# Patient Record
Sex: Male | Born: 1955 | Race: Black or African American | Hispanic: No | Marital: Married | Smoking: Former smoker
Health system: Southern US, Community
[De-identification: ages and names within clinical notes are randomized; demographics above are authoritative.]

## PROBLEM LIST (undated history)

## (undated) DIAGNOSIS — T884XXA Failed or difficult intubation, initial encounter: Secondary | ICD-10-CM

## (undated) DIAGNOSIS — N529 Male erectile dysfunction, unspecified: Secondary | ICD-10-CM

## (undated) DIAGNOSIS — Z720 Tobacco use: Secondary | ICD-10-CM

## (undated) DIAGNOSIS — I10 Essential (primary) hypertension: Secondary | ICD-10-CM

## (undated) DIAGNOSIS — C109 Malignant neoplasm of oropharynx, unspecified: Secondary | ICD-10-CM

## (undated) DIAGNOSIS — L089 Local infection of the skin and subcutaneous tissue, unspecified: Secondary | ICD-10-CM

## (undated) DIAGNOSIS — C801 Malignant (primary) neoplasm, unspecified: Secondary | ICD-10-CM

## (undated) DIAGNOSIS — E785 Hyperlipidemia, unspecified: Secondary | ICD-10-CM

## (undated) HISTORY — DX: Male erectile dysfunction, unspecified: N52.9

## (undated) HISTORY — DX: Malignant neoplasm of oropharynx, unspecified: C10.9

## (undated) HISTORY — DX: Hyperlipidemia, unspecified: E78.5

## (undated) HISTORY — DX: Essential (primary) hypertension: I10

## (undated) HISTORY — DX: Malignant (primary) neoplasm, unspecified: C80.1

## (undated) HISTORY — DX: Tobacco use: Z72.0

## (undated) HISTORY — DX: Local infection of the skin and subcutaneous tissue, unspecified: L08.9

---

## 2003-07-09 HISTORY — PX: HERNIA REPAIR: SHX51

## 2003-12-10 ENCOUNTER — Emergency Department (HOSPITAL_COMMUNITY): Admission: EM | Admit: 2003-12-10 | Discharge: 2003-12-10 | Payer: Self-pay | Admitting: *Deleted

## 2005-01-09 ENCOUNTER — Emergency Department (HOSPITAL_COMMUNITY): Admission: EM | Admit: 2005-01-09 | Discharge: 2005-01-09 | Payer: Self-pay | Admitting: Family Medicine

## 2005-02-13 ENCOUNTER — Ambulatory Visit (HOSPITAL_COMMUNITY): Admission: RE | Admit: 2005-02-13 | Discharge: 2005-02-13 | Payer: Self-pay | Admitting: Surgery

## 2005-02-13 ENCOUNTER — Ambulatory Visit (HOSPITAL_BASED_OUTPATIENT_CLINIC_OR_DEPARTMENT_OTHER): Admission: RE | Admit: 2005-02-13 | Discharge: 2005-02-13 | Payer: Self-pay | Admitting: Surgery

## 2005-11-18 ENCOUNTER — Ambulatory Visit: Payer: Self-pay | Admitting: Internal Medicine

## 2005-11-20 ENCOUNTER — Ambulatory Visit: Payer: Self-pay | Admitting: Internal Medicine

## 2005-11-26 ENCOUNTER — Ambulatory Visit: Payer: Self-pay | Admitting: Internal Medicine

## 2006-01-16 ENCOUNTER — Ambulatory Visit: Payer: Self-pay | Admitting: Gastroenterology

## 2006-01-24 ENCOUNTER — Encounter (INDEPENDENT_AMBULATORY_CARE_PROVIDER_SITE_OTHER): Payer: Self-pay | Admitting: *Deleted

## 2006-01-24 ENCOUNTER — Ambulatory Visit: Payer: Self-pay | Admitting: Gastroenterology

## 2006-03-05 ENCOUNTER — Ambulatory Visit: Payer: Self-pay | Admitting: Hospitalist

## 2006-03-12 ENCOUNTER — Ambulatory Visit: Payer: Self-pay | Admitting: Internal Medicine

## 2006-05-20 DIAGNOSIS — E785 Hyperlipidemia, unspecified: Secondary | ICD-10-CM | POA: Insufficient documentation

## 2006-07-24 DIAGNOSIS — F528 Other sexual dysfunction not due to a substance or known physiological condition: Secondary | ICD-10-CM

## 2006-09-11 ENCOUNTER — Telehealth (INDEPENDENT_AMBULATORY_CARE_PROVIDER_SITE_OTHER): Payer: Self-pay | Admitting: *Deleted

## 2006-09-15 ENCOUNTER — Telehealth: Payer: Self-pay | Admitting: *Deleted

## 2006-10-09 ENCOUNTER — Ambulatory Visit: Payer: Self-pay | Admitting: Internal Medicine

## 2006-10-09 ENCOUNTER — Encounter (INDEPENDENT_AMBULATORY_CARE_PROVIDER_SITE_OTHER): Payer: Self-pay | Admitting: *Deleted

## 2006-10-16 ENCOUNTER — Encounter (INDEPENDENT_AMBULATORY_CARE_PROVIDER_SITE_OTHER): Payer: Self-pay | Admitting: *Deleted

## 2006-10-16 ENCOUNTER — Ambulatory Visit: Payer: Self-pay | Admitting: Internal Medicine

## 2006-10-16 LAB — CONVERTED CEMR LAB
Albumin: 4.3 g/dL (ref 3.5–5.2)
CO2: 22 meq/L (ref 19–32)
Cholesterol: 166 mg/dL (ref 0–200)
Glucose, Bld: 82 mg/dL (ref 70–99)
Sodium: 142 meq/L (ref 135–145)
Total Bilirubin: 0.4 mg/dL (ref 0.3–1.2)
Total Protein: 7.2 g/dL (ref 6.0–8.3)
Triglycerides: 64 mg/dL (ref <150)
VLDL: 13 mg/dL (ref 0–40)

## 2007-07-15 ENCOUNTER — Ambulatory Visit: Payer: Self-pay | Admitting: Internal Medicine

## 2007-07-21 ENCOUNTER — Encounter (INDEPENDENT_AMBULATORY_CARE_PROVIDER_SITE_OTHER): Payer: Self-pay | Admitting: *Deleted

## 2007-07-21 ENCOUNTER — Ambulatory Visit: Payer: Self-pay | Admitting: Internal Medicine

## 2007-07-22 LAB — CONVERTED CEMR LAB
BUN: 17 mg/dL (ref 6–23)
CO2: 25 meq/L (ref 19–32)
Calcium: 9.4 mg/dL (ref 8.4–10.5)
Chloride: 106 meq/L (ref 96–112)
Cholesterol: 174 mg/dL (ref 0–200)
Creatinine, Ser: 0.92 mg/dL (ref 0.40–1.50)
Glucose, Bld: 95 mg/dL (ref 70–99)
HDL: 43 mg/dL (ref >39)
Total CHOL/HDL Ratio: 4
Triglycerides: 97 mg/dL (ref <150)

## 2007-07-23 ENCOUNTER — Ambulatory Visit: Payer: Self-pay | Admitting: Internal Medicine

## 2007-07-24 ENCOUNTER — Encounter: Payer: Self-pay | Admitting: Internal Medicine

## 2007-07-24 ENCOUNTER — Ambulatory Visit: Payer: Self-pay | Admitting: Surgery

## 2007-07-24 ENCOUNTER — Telehealth: Payer: Self-pay | Admitting: *Deleted

## 2007-07-24 ENCOUNTER — Ambulatory Visit (HOSPITAL_COMMUNITY): Admission: RE | Admit: 2007-07-24 | Discharge: 2007-07-24 | Payer: Self-pay | Admitting: Internal Medicine

## 2007-07-30 ENCOUNTER — Ambulatory Visit: Payer: Self-pay | Admitting: Internal Medicine

## 2007-08-18 ENCOUNTER — Encounter (INDEPENDENT_AMBULATORY_CARE_PROVIDER_SITE_OTHER): Payer: Self-pay | Admitting: *Deleted

## 2007-08-18 ENCOUNTER — Ambulatory Visit: Payer: Self-pay | Admitting: Internal Medicine

## 2007-09-15 ENCOUNTER — Ambulatory Visit: Payer: Self-pay | Admitting: Hospitalist

## 2007-09-16 ENCOUNTER — Ambulatory Visit (HOSPITAL_COMMUNITY): Admission: RE | Admit: 2007-09-16 | Discharge: 2007-09-16 | Payer: Self-pay | Admitting: Internal Medicine

## 2007-09-21 ENCOUNTER — Telehealth (INDEPENDENT_AMBULATORY_CARE_PROVIDER_SITE_OTHER): Payer: Self-pay | Admitting: *Deleted

## 2007-09-22 ENCOUNTER — Ambulatory Visit: Payer: Self-pay | Admitting: Hospitalist

## 2007-09-22 DIAGNOSIS — D179 Benign lipomatous neoplasm, unspecified: Secondary | ICD-10-CM | POA: Insufficient documentation

## 2008-10-03 ENCOUNTER — Telehealth: Payer: Self-pay | Admitting: *Deleted

## 2008-10-20 ENCOUNTER — Ambulatory Visit: Payer: Self-pay

## 2008-10-20 ENCOUNTER — Encounter: Payer: Self-pay | Admitting: Internal Medicine

## 2008-10-20 ENCOUNTER — Encounter (INDEPENDENT_AMBULATORY_CARE_PROVIDER_SITE_OTHER): Payer: Self-pay | Admitting: *Deleted

## 2008-10-20 DIAGNOSIS — I10 Essential (primary) hypertension: Secondary | ICD-10-CM

## 2008-10-21 LAB — CONVERTED CEMR LAB
LDL Cholesterol: 141 mg/dL — ABNORMAL HIGH (ref 0–99)
VLDL: 20 mg/dL (ref 0–40)

## 2008-11-09 ENCOUNTER — Ambulatory Visit: Payer: Self-pay | Admitting: Infectious Disease

## 2008-11-09 ENCOUNTER — Encounter: Payer: Self-pay | Admitting: Internal Medicine

## 2008-11-09 LAB — CONVERTED CEMR LAB
ALT: 17 units/L (ref 0–53)
AST: 22 units/L (ref 0–37)
BUN: 19 mg/dL (ref 6–23)
Creatinine, Ser: 0.97 mg/dL (ref 0.40–1.50)
GFR calc Af Amer: >60 mL/min (ref >60)
HDL goal, serum: 40 mg/dL
LDL Goal: 130 mg/dL
Total Bilirubin: 0.3 mg/dL (ref 0.3–1.2)

## 2009-03-30 ENCOUNTER — Ambulatory Visit: Payer: Self-pay | Admitting: Internal Medicine

## 2009-03-30 LAB — CONVERTED CEMR LAB
AST: 24 units/L (ref 0–37)
Albumin: 4.3 g/dL (ref 3.5–5.2)
BUN: 21 mg/dL (ref 6–23)
Calcium: 9.4 mg/dL (ref 8.4–10.5)
Chloride: 103 meq/L (ref 96–112)
Glucose, Bld: 99 mg/dL (ref 70–99)
HDL: 46 mg/dL (ref >39)
LDL Cholesterol: 110 mg/dL — ABNORMAL HIGH (ref 0–99)
Potassium: 4.2 meq/L (ref 3.5–5.3)
Sodium: 140 meq/L (ref 135–145)
Total Protein: 7.3 g/dL (ref 6.0–8.3)

## 2009-12-22 ENCOUNTER — Ambulatory Visit: Payer: Self-pay | Admitting: Internal Medicine

## 2009-12-22 DIAGNOSIS — F17200 Nicotine dependence, unspecified, uncomplicated: Secondary | ICD-10-CM

## 2010-01-09 ENCOUNTER — Emergency Department (HOSPITAL_COMMUNITY): Admission: EM | Admit: 2010-01-09 | Discharge: 2010-01-09 | Payer: Self-pay | Admitting: Family Medicine

## 2010-02-02 ENCOUNTER — Ambulatory Visit: Payer: Self-pay | Admitting: Internal Medicine

## 2010-02-02 DIAGNOSIS — L089 Local infection of the skin and subcutaneous tissue, unspecified: Secondary | ICD-10-CM | POA: Insufficient documentation

## 2010-08-09 NOTE — Assessment & Plan Note (Signed)
Summary: EST-CK/FU/MEDS/CFB   Vital Signs:  Patient profile:   55 year old male Height:      74 inches (187.96 cm) Weight:      236.03 pounds (107.29 kg) BMI:     30.41 Temp:     97 degrees F (36.11 degrees C) oral Pulse rate:   84 / minute BP sitting:   135 / 86  (right arm)  Vitals Entered By: Angelina Ok RN (December 22, 2009 3:09 PM) Is Patient Diabetic? No Pain Assessment Patient in pain? no      Nutritional Status BMI of > 30 = obese  Have you ever been in a relationship where you felt threatened, hurt or afraid?No   Does patient need assistance? Functional Status Self care Ambulation Normal Comments Needs refill on meds.  Headaches on yesterday went away after takking Aleve.   Primary Care Provider:  Jackson Latino MD   History of Present Illness: Pt is a 55 yo AAM with PMH of HTN, tobacco abuse and HLD came here for med refill. He has been been doing well, no CP, HA, SOB, fever. He has run out of HCTZ for 2 weeks,  good appetite, no diarrhea or dysuria.  He is a current smoker, 1/2 PPD, occasional drink, no drugs. Denies any muscle pain.   Depression History:      The patient denies a depressed mood most of the day and a diminished interest in his usual daily activities.         Preventive Screening-Counseling & Management  Alcohol-Tobacco     Alcohol type: on special occasion     Smoking Status: current     Smoking Cessation Counseling: yes     Packs/Day: 0.5     Year Started: ABOUT 3 YEARS AGO  Comments: Increase  Problems Prior to Update: 1)  Preventive Health Care  (ICD-V70.0) 2)  Lipoma  (ICD-214.9) 3)  Essential Hypertension, Benign  (ICD-401.1) 4)  Aftercare, Long-term Use, Medications Nec  (ICD-V58.69) 5)  Erectile Dysfunction  (ICD-302.72) 6)  Hyperlipidemia  (ICD-272.4)  Medications Prior to Update: 1)  Pravastatin Sodium 40 Mg  Tabs (Pravastatin Sodium) .... Take 2 Tablets By Mouth At Bedtime 2)  Viagra 50 Mg  Tabs (Sildenafil Citrate)  .... Take 1 Tablet By Mouth When Needed.  Do Not Exceed 1 Dose Daily. 3)  Hydrochlorothiazide 25 Mg Tabs (Hydrochlorothiazide) .... Take 1 Tablet By Mouth Once A Day  Current Medications (verified): 1)  Pravastatin Sodium 40 Mg  Tabs (Pravastatin Sodium) .... Take 2 Tablets By Mouth At Bedtime 2)  Viagra 50 Mg  Tabs (Sildenafil Citrate) .... Take 1 Tablet By Mouth When Needed.  Do Not Exceed 1 Dose Daily. 3)  Hydrochlorothiazide 25 Mg Tabs (Hydrochlorothiazide) .... Take 1 Tablet By Mouth Once A Day  Allergies (verified): No Known Drug Allergies  Past History:  Past Medical History: Last updated: 09/22/2007 - L inner thigh nodule (largest measured at 5.5x3.5cm) (1/09) = LIPOMA - Elevated BP without diagnosis of HTN (1/09) - Hyperlipidemia - Erectile dysfunction  Past Surgical History: Last updated: 10/09/2006 Right Hernia repair 2005  Family History: Last updated: 07/15/2007 Mom: living as of 1/09 Dad: living as of 1/09 Siblings: has 5 sisters (some with anxiety/depression) Children: 1 son (48; healthy) and 1 daughter (43; healthy)  Pt knows of no family h/o HTN, heart problems, CVAs.  Social History: Last updated: 07/15/2007 Occupation:[employer] engineer, contracting; a Forensic scientist) Current Smoker:  1/2 pk/2 day (quit for 5y, now been smoking again  for 3y) x 30y Alcohol use - yes on holidays Drug use - no  Risk Factors: Caffeine Use: 3 (03/30/2009) Exercise: yes (03/30/2009)  Risk Factors: Smoking Status: current (12/22/2009) Packs/Day: 0.5 (12/22/2009)  Family History: Reviewed history from 07/15/2007 and no changes required. Mom: living as of 1/09 Dad: living as of 1/09 Siblings: has 5 sisters (some with anxiety/depression) Children: 1 son (19; healthy) and 1 daughter (64; healthy)  Pt knows of no family h/o HTN, heart problems, CVAs.  Social History: Reviewed history from 07/15/2007 and no changes required. Occupation:[employer] engineer, contracting; a  Forensic scientist) Current Smoker:  1/2 pk/2 day (quit for 5y, now been smoking again for 3y) x 30y Alcohol use - yes on holidays Drug use - noPacks/Day:  0.5  Review of Systems  The patient denies fever, chest pain, syncope, dyspnea on exertion, peripheral edema, prolonged cough, headaches, abdominal pain, melena, and hematuria.    Physical Exam  General:  alert, well-developed, well-nourished, and well-hydrated.   Head:  normocephalic.   Nose:  no nasal discharge.   Mouth:  pharynx pink and moist.   Neck:  supple.   Lungs:  normal respiratory effort, normal breath sounds, no crackles, and no wheezes.   Heart:  normal rate, regular rhythm, no murmur, and no JVD.   Abdomen:  soft, non-tender, normal bowel sounds, and no distention.   Msk:  normal ROM, no joint tenderness, no joint swelling, and no joint warmth.   Pulses:  2+ Extremities:  No edema.  Neurologic:  alert & oriented X3, cranial nerves II-XII intact, strength normal in all extremities, sensation intact to light touch, and DTRs symmetrical and normal.     Impression & Recommendations:  Problem # 1:  ESSENTIAL HYPERTENSION, BENIGN (ICD-401.1) Assessment Unchanged His BP is okay and has run out of HCTZ for about 2 weeks. Will refill this for him. Also advised exercise and smoking cessation. Will check BP and CMET at next visit.   His updated medication list for this problem includes:    Hydrochlorothiazide 25 Mg Tabs (Hydrochlorothiazide) .Marland Kitchen... Take 1 tablet by mouth once a day  BP today: 135/86 Prior BP: 117/84 (03/30/2009)  Prior 10 Yr Risk Heart Disease: 18 % (11/09/2008)  Labs Reviewed: K+: 4.2 (03/30/2009) Creat: : 0.94 (03/30/2009)   Chol: 171 (03/30/2009)   HDL: 46 (03/30/2009)   LDL: 110 (03/30/2009)   TG: 74 (03/30/2009)  Problem # 2:  HYPERLIPIDEMIA (ICD-272.4) Assessment: Unchanged No muscle pain, will recheck FLP and CMET.   His updated medication list for this problem includes:    Pravastatin Sodium 40 Mg  Tabs (Pravastatin sodium) .Marland Kitchen... Take 2 tablets by mouth at bedtime  Labs Reviewed: SGOT: 24 (03/30/2009)   SGPT: 26 (03/30/2009)  Lipid Goals: Chol Goal: 200 (11/09/2008)   HDL Goal: 40 (11/09/2008)   LDL Goal: 130 (11/09/2008)   TG Goal: 150 (11/09/2008)  Prior 10 Yr Risk Heart Disease: 18 % (11/09/2008)   HDL:46 (03/30/2009), 42 (10/20/2008)  LDL:110 (03/30/2009), 141 (10/20/2008)  Chol:171 (03/30/2009), 203 (10/20/2008)  Trig:74 (03/30/2009), 101 (10/20/2008)  Problem # 3:  ERECTILE DYSFUNCTION (ICD-302.72) Assessment: Unchanged  He has ED and responds well to viagra. He wants to get it and will refill it. His updated medication list for this problem includes:    Viagra 50 Mg Tabs (Sildenafil citrate) .Marland Kitchen... Take 1 tablet by mouth when needed.  do not exceed 1 dose daily.  Problem # 4:  TOBACCO ABUSE (ICD-305.1) Assessment: Unchanged  Encouraged smoking cessation and discussed different  methods for smoking cessation. He wants to cut for now.   Complete Medication List: 1)  Pravastatin Sodium 40 Mg Tabs (Pravastatin sodium) .... Take 2 tablets by mouth at bedtime 2)  Viagra 50 Mg Tabs (Sildenafil citrate) .... Take 1 tablet by mouth when needed.  do not exceed 1 dose daily. 3)  Hydrochlorothiazide 25 Mg Tabs (Hydrochlorothiazide) .... Take 1 tablet by mouth once a day  Patient Instructions: 1)  Please schedule a follow-up appointment in 4 months. 2)  Tobacco is very bad for your health and your loved ones! You Should stop smoking!. 3)  Stop Smoking Tips: Choose a Quit date. Cut down before the Quit date. decide what you will do as a substitute when you feel the urge to smoke(gum,toothpick,exercise). 4)  It is important that you exercise regularly at least 20 minutes 5 times a week. If you develop chest pain, have severe difficulty breathing, or feel very tired , stop exercising immediately and seek medical attention. Prescriptions: HYDROCHLOROTHIAZIDE 25 MG TABS  (HYDROCHLOROTHIAZIDE) Take 1 tablet by mouth once a day  #90 x 3   Entered and Authorized by:   Jackson Latino MD   Signed by:   Jackson Latino MD on 12/22/2009   Method used:   Electronically to        [facility] [phone]* (retail)       [address]       [place], [place]  [address]       Ph: [phone]       Fax: [phone]   RxID:   [id number] VIAGRA 50 MG  TABS (SILDENAFIL CITRATE) Take 1 tablet by mouth when needed.  Do not exceed 1 dose daily.  #10 x 4   Entered and Authorized by:   Jackson Latino MD   Signed by:   Jackson Latino MD on 12/22/2009   Method used:   Electronically to        [facility] [phone]* (retail)       [address]       [place], [place]  [address]       Ph: [phone]       Fax: [phone]   RxID:   [id number] PRAVASTATIN SODIUM 40 MG  TABS (PRAVASTATIN SODIUM) Take 2 tablets by mouth at bedtime  #60 x 4   Entered and Authorized by:   Jackson Latino MD   Signed by:   Jackson Latino MD on 12/22/2009   Method used:   Electronically to        [facility] [phone]* (retail)       [address]       [place], [place]  [address]       Ph: [phone]       Fax: [phone]   RxID:   [id number]   Prevention & Chronic Care Immunizations   Influenza vaccine: Not documented   Influenza vaccine deferral: Refused  (03/30/2009)    Tetanus booster: Not documented    Pneumococcal vaccine: Not documented  Colorectal Screening   Hemoccult: Not documented    Colonoscopy: Normal findings  (01/24/2006)   Colonoscopy due: 01/25/2016  Other Screening   PSA: Not documented   Smoking status: current  (12/22/2009)   Smoking cessation counseling: yes  (12/22/2009)  Lipids   Total Cholesterol: 171  (03/30/2009)   Lipid panel action/deferral: Lipid Panel ordered   LDL: 110  (03/30/2009)   LDL Direct: Not documented   HDL: 46  (03/30/2009)  Triglycerides: 74  (03/30/2009)    SGOT (AST): 24  (03/30/2009)    SGPT (ALT): 26  (03/30/2009)   Alkaline phosphatase: 60  (03/30/2009)   Total bilirubin: 0.3  (03/30/2009)    Lipid flowsheet reviewed?: Yes   Progress toward LDL goal: Unchanged  Hypertension   Last Blood Pressure: 135 / 86  (12/22/2009)   Serum creatinine: 0.94  (03/30/2009)   Serum potassium 4.2  (03/30/2009)    Hypertension flowsheet reviewed?: Yes   Progress toward BP goal: At goal  Self-Management Support :   Personal Goals (by the next clinic visit) :      Personal blood pressure goal: 140/90  (03/30/2009)     Personal LDL goal: 130  (03/30/2009)    Patient will work on the following items until the next clinic visit to reach self-care goals:     Medications and monitoring: take my medicines every day, bring all of my medications to every visit  (12/22/2009)     Eating: drink diet soda or water instead of juice or soda, eat more vegetables, eat foods that are low in salt, eat baked foods instead of fried foods, eat fruit for snacks and desserts, limit or avoid alcohol  (12/22/2009)     Activity: take a 30 minute walk every day  (12/22/2009)    Hypertension self-management support: Written self-care plan, Education handout, Pre-printed educational material, Resources for patients handout  (12/22/2009)   Hypertension self-care plan printed.   Hypertension education handout printed    Lipid self-management support: Written self-care plan, Education handout, Pre-printed educational material, Resources for patients handout  (12/22/2009)   Lipid self-care plan printed.   Lipid education handout printed      Resource handout printed.     Vital Signs:  Patient profile:   55 year old male Height:      74 inches (187.96 cm) Weight:      236.03 pounds (107.29 kg) BMI:     30.41 Temp:     97 degrees F (36.11 degrees C) oral Pulse rate:   84 / minute BP sitting:   135 / 86  (right arm)  Vitals Entered By: Angelina Ok RN (December 22, 2009 3:09 PM)

## 2010-08-09 NOTE — Assessment & Plan Note (Signed)
Summary: ACUTE-F/U WITH SPIDER BITE/(YANG)/CFB   Vital Signs:  Patient profile:   55 year old male Height:      74 inches Weight:      228.4 pounds BMI:     29.43 Temp:     97.3 degrees F oral Pulse rate:   81 / minute BP sitting:   129 / 78  (right arm)  Vitals Entered By: Filomena Jungling NT II (February 02, 2010 3:13 PM) CC: FOLLOW-up visitFOR SPIDER BITE Is Patient Diabetic? No Pain Assessment Patient in pain? no      Nutritional Status BMI of 25 - 29 = overweight  Does patient need assistance? Functional Status Self care Ambulation Normal   Primary Care Provider:  Jackson Latino MD  CC:  FOLLOW-up visitFOR SPIDER BITE.  History of Present Illness: Anthony Campos is here today for follow-up of a spider bite. On July 1, he was outside watering his daughter's lawn when a large brown spider ran up his pant leg. He believes that the spider bit him on his left shin. He was seen at urgent care four days later at which point the wound had blistered and was itching. He was prescribed Diprolene AF 0.05% cream and a one week course of doxycycline. He completed the one week course of antibiotics and has continued to apply the cream consistently twice per day. He reports that the original bite site has healed but that the wound has migrated a few inches up his leg. The wound does not cause him pain at rest, but it is tender when he accidentally bumps it. He denies tingling, itching, or burning or any systemic symptoms, such as fever or chills.   Preventive Screening-Counseling & Management  Alcohol-Tobacco     Alcohol type: on special occasion     Smoking Status: current     Smoking Cessation Counseling: yes     Packs/Day: 0.5     Year Started: ABOUT 3 YEARS AGO  Caffeine-Diet-Exercise     Caffeine use/day: 3     Does Patient Exercise: yes     Type of exercise: WEIGHT LIFTING     Times/week: 3  Current Medications (verified): 1)  Pravastatin Sodium 40 Mg  Tabs (Pravastatin Sodium) ....  Take 2 Tablets By Mouth At Bedtime 2)  Viagra 50 Mg  Tabs (Sildenafil Citrate) .... Take 1 Tablet By Mouth When Needed.  Do Not Exceed 1 Dose Daily. 3)  Hydrochlorothiazide 25 Mg Tabs (Hydrochlorothiazide) .... Take 1 Tablet By Mouth Once A Day 4)  Smz-Tmp Ds 800-160 Mg Tabs (Sulfamethoxazole-Trimethoprim) .... 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 Has adult children and three grandchildren. Current Smoker:  1/2 pk/2 day (quit for 5y, now been smoking again for 3y) x 30y Alcohol use - yes on holidays Drug use - no  Review of Systems      See HPI  Physical Exam  General:  alert, well-developed, well-nourished, and cooperative to examination.   Head:  normocephalic and atraumatic.   Eyes:  vision grossly intact, pupils equal, pupils round, and pupils reactive to light.   Mouth:  pharynx pink and moist and fair dentition.   Neck:  supple, full ROM, and no masses.   Lungs:  normal breath sounds.   Heart:  normal rate, regular rhythm, no murmur, no gallop, and no rub.   Msk:  Healed hypopigmented scar on L shin, which corresponds  to patient's description of bite. Ulcerated 1cm lesion with purulent discharge and yellow crust approximately 3-4 cm above and lateral to bite site. Nuerous hyperpigmented papules that appear to be resolving run between bite site and ulcerated lesion.  Pulses:  R dorsalis pedis normal and L dorsalis pedis normal.   Neurologic:  alert & oriented X3.     Impression & Recommendations:  Problem # 1:  INFECTION, SKIN AND SOFT TISSUE (ICD-686.9) It is difficult to determine the precise history of Anthony Campos skin lesion. Perhaps there are 2-3 processes responsible for the unusual pattern. The small hypopigmented scar could represent a healed spider or insect bite. The cluster of hyperpigmented papules more closely resemble resolving shingles (L5  distribution); however, he denies having had any tingling, burning, or significant pain. The ulcerated lesion certainly appears to represent an active bacterial infection, which may or may not be related in origin to the other lesions on his leg. Perhaps he injured himself by scratching or perhaps he created or exacerbated an open wound with excessive application of the steroid cream. Regardless, the amount of time that has passed from the original injury and the consistent use of a strong corticosteroid cream make the natural history of this lesion difficult to determine. We prescribed a one week course of TMP-SMX to treat community-acquired MRSA and have advised Anthony Campos to stop using the steroid cream. We also took a swab culture of the lesion to inform his therapy. He should call our office if the lesion does not begin to heal after taking the antibiotics.   Orders: T-Culture, Wound ([id number])  Problem # 2:  HYPERLIPIDEMIA (ICD-272.4) It has been about 10 months since Anthony Campos lipids were last checked. Therefore, we have asked him to return at his convenience for a fasting lipid panel to be drawn.   His updated medication list for this problem includes:    Pravastatin Sodium 40 Mg Tabs (Pravastatin sodium) .Marland Kitchen... Take 2 tablets by mouth at bedtime  Future Orders: T-Lipid Profile ([id number]) ... 02/05/2010  Problem # 3:  ESSENTIAL HYPERTENSION, BENIGN (ICD-401.1) Well controlled. Continue current management.   His updated medication list for this problem includes:    Hydrochlorothiazide 25 Mg Tabs (Hydrochlorothiazide) .Marland Kitchen... Take 1 tablet by mouth once a day  Problem # 4:  TOBACCO ABUSE (ICD-305.1) Anthony Campos is trying to cut back on his smoking. He is currently smoking 0.5 pack per day or less. He has developed strategies, such as reaching for a mint when he craves a cigarette, to avoid smoking. He says that he is also seeking spirtual guidance to help quit smoking. We  reviewed the health benefits of avoiding tobacco products and strategies of quitting.   Complete Medication List: 1)  Pravastatin Sodium 40 Mg Tabs (Pravastatin sodium) .... Take 2 tablets by mouth at bedtime 2)  Viagra 50 Mg Tabs (Sildenafil citrate) .... Take 1 tablet by mouth when needed.  do not exceed 1 dose daily. 3)  Hydrochlorothiazide 25 Mg Tabs (Hydrochlorothiazide) .... Take 1 tablet by mouth once a day 4)  Smz-tmp Ds 800-160 Mg Tabs (Sulfamethoxazole-trimethoprim) .... Take 1 tablet by mouth two times a day  Patient Instructions: 1)  Please call our office if the wound on your leg gets worse, does not improve, or does not heal. 2)  Please come in, at your convenience, to have labs drawn. Please do not eat or drink anything that morning before having labs drawn.  3)  Please schedule a follow-up appointment in 6  months, or earlier if needed. Prescriptions: SMZ-TMP DS 800-160 MG TABS (SULFAMETHOXAZOLE-TRIMETHOPRIM) Take 1 tablet by mouth two times a day  #14 x 0   Entered and Authorized by:   Whitney Post MD   Signed by:   Whitney Post MD on 02/02/2010   Method used:   Electronically to        [facility] [phone]* (retail)       [address]       [place], [place]  [address]       Ph: [phone]       Fax: [phone]   RxID:   [id number]  Process Orders Check Orders Results:     [facility]: ABN not required for this insurance Tests Sent for requisitioning (February 04, 2010 9:17 PM):     02/05/2010: [facility] -- T-Lipid Profile [phone] (signed)     02/02/2010: [facility] -- T-Culture, Wound [[id number]] (signed)    Prevention & Chronic Care Immunizations   Influenza vaccine: Not documented   Influenza vaccine deferral: Refused  (03/30/2009)    Tetanus booster: Not documented    Pneumococcal vaccine: Not documented  Colorectal Screening   Hemoccult: Not documented    Colonoscopy: Normal  findings  (01/24/2006)   Colonoscopy due: 01/25/2016  Other Screening   PSA: Not documented   Smoking status: current  (02/02/2010)   Smoking cessation counseling: yes  (02/02/2010)  Lipids   Total Cholesterol: 171  (03/30/2009)   Lipid panel action/deferral: Lipid Panel ordered   LDL: 110  (03/30/2009)   LDL Direct: Not documented   HDL: 46  (03/30/2009)   Triglycerides: 74  (03/30/2009)    SGOT (AST): 24  (03/30/2009)   SGPT (ALT): 26  (03/30/2009)   Alkaline phosphatase: 60  (03/30/2009)   Total bilirubin: 0.3  (03/30/2009)    Lipid flowsheet reviewed?: Yes   Progress toward LDL goal: Improved  Hypertension   Last Blood Pressure: 129 / 78  (02/02/2010)   Serum creatinine: 0.94  (03/30/2009)   Serum potassium 4.2  (03/30/2009)    Hypertension flowsheet reviewed?: Yes   Progress toward BP goal: At goal  Self-Management Support :   Personal Goals (by the next clinic visit) :      Personal blood pressure goal: 140/90  (03/30/2009)     Personal LDL goal: 130  (03/30/2009)    Hypertension self-management support: Written self-care plan, Education handout, Pre-printed educational material, Resources for patients handout  (12/22/2009)    Lipid self-management support: Written self-care plan, Education handout, Pre-printed educational material, Resources for patients handout  (12/22/2009)     Appended Document: Orders Update    Clinical Lists Changes  Orders: Added new Test order of T-Comprehensive Metabolic Panel [id number]) - Signed

## 2010-10-30 ENCOUNTER — Encounter (INDEPENDENT_AMBULATORY_CARE_PROVIDER_SITE_OTHER): Payer: BC Managed Care – PPO | Admitting: Internal Medicine

## 2010-10-30 ENCOUNTER — Ambulatory Visit (INDEPENDENT_AMBULATORY_CARE_PROVIDER_SITE_OTHER): Payer: BC Managed Care – PPO | Admitting: Ophthalmology

## 2010-10-30 ENCOUNTER — Encounter: Payer: Self-pay | Admitting: Internal Medicine

## 2010-10-30 DIAGNOSIS — E785 Hyperlipidemia, unspecified: Secondary | ICD-10-CM

## 2010-10-30 DIAGNOSIS — I10 Essential (primary) hypertension: Secondary | ICD-10-CM

## 2010-10-30 DIAGNOSIS — F17200 Nicotine dependence, unspecified, uncomplicated: Secondary | ICD-10-CM

## 2010-10-30 MED ORDER — SILDENAFIL CITRATE 50 MG PO TABS: 50.0000 mg | ORAL_TABLET | ORAL | Status: DC | PRN

## 2010-10-30 MED ORDER — HYDROCHLOROTHIAZIDE 25 MG PO TABS: 25.0000 mg | ORAL_TABLET | Freq: Every day | ORAL | Status: DC

## 2010-10-30 MED ORDER — PRAVASTATIN SODIUM 40 MG PO TABS: 40.0000 mg | ORAL_TABLET | Freq: Every evening | ORAL | Status: DC

## 2010-10-30 NOTE — Patient Instructions (Addendum)
Your blood pressure looks great today, I have refilled your medications. I would like you to return in the next 2 weeks to check your cholesterol as well as your blood chemistries. Otherwise followup with her primary doctor in the next 3-4 months.

## 2010-10-30 NOTE — Progress Notes (Signed)
  Subjective:    Patient ID: Anthony Campos, male    DOB: 09-May-1956, 55 y.o.   MRN: [id number]  HPI   This is a 55 year old male with a past medical history significant for hypertension, and hyperlipidemia, who presents for routine followup because he ran out of his medications. Patient really has no complaints, denies any chest pain shortness of breath, change in his bowel movements, dark stool, or blood in the stool. The patient has been watching his diet and trying to eat less fried foods, the patient sleeps well, he does pushups nightly for exercise, and he is continuing to try to cut back on smoking. The patient is currently up-to-date on his colonoscopy.  Review of Systems  Constitutional: Negative for fever and chills.  Respiratory: Negative for cough and shortness of breath.   Cardiovascular: Negative for chest pain and palpitations.  Gastrointestinal: Negative for vomiting, diarrhea and constipation.        Objective:   Physical Exam  Constitutional: He appears well-developed and well-nourished.  HENT:  Head: Normocephalic and atraumatic.  Eyes: Pupils are equal, round, and reactive to light.  Cardiovascular: Normal rate, regular rhythm and intact distal pulses.  Exam reveals no gallop and no friction rub.   No murmur heard. Pulmonary/Chest: Effort normal and breath sounds normal. He has no wheezes. He has no rales.  Abdominal: Soft. Bowel sounds are normal. He exhibits no distension. There is no tenderness.  Musculoskeletal: Normal range of motion.  Neurological: He is alert. No cranial nerve deficit.  Skin: No rash noted.        Current Outpatient Prescriptions  Medication Sig Dispense Refill  . hydrochlorothiazide 25 MG tablet Take 1 tablet (25 mg total) by mouth daily.  30 tablet  11  . pravastatin (PRAVACHOL) 40 MG tablet Take 1 tablet (40 mg total) by mouth every evening.  30 tablet  11  . sildenafil (VIAGRA) 50 MG tablet Take 1 tablet (50 mg total) by mouth as needed  for erectile dysfunction.  2 tablet  11   Patient Active Problem List  Diagnoses  . LIPOMA  . HYPERLIPIDEMIA  . ERECTILE DYSFUNCTION  . TOBACCO ABUSE  . ESSENTIAL HYPERTENSION, BENIGN  . INFECTION, SKIN AND SOFT TISSUE    Assessment & Plan:

## 2010-10-30 NOTE — Assessment & Plan Note (Signed)
The patient's blood pressure was 110/72 today, he appears to be under very good control, and I will continue the patient's HCTZ at this time. I recommended that the patient continue eating a low salt diet, as well as increase his exercise. I recommended that the patient exercise at least 30 minutes a day 5 days a week, the patient agreed to begin working on increasing his exercise tolerance. The patient's last BMET was normal that was performed in 03/2009 and, I will repeat this when the patient returns for fasting lipid panel.    Chemistry      Component Value Date/Time   NA 140 03/30/2009 2100   K 4.2 03/30/2009 2100   CL 103 03/30/2009 2100   CO2 28 03/30/2009 2100   BUN 21 03/30/2009 2100   CREATININE 0.94 03/30/2009 2100      Component Value Date/Time   CALCIUM 9.4 03/30/2009 2100   ALKPHOS 60 03/30/2009 2100   AST 24 03/30/2009 2100   ALT 26 03/30/2009 2100   BILITOT 0.3 03/30/2009 2100

## 2010-10-30 NOTE — Assessment & Plan Note (Signed)
The patient continues to smoke, and I recommended that he continue to attempt to cut back. The patient is not interested in quitting at this time.

## 2010-10-30 NOTE — Progress Notes (Signed)
  Subjective:    Patient ID: Anthony Campos, male    DOB: 03/15/1956, 55 y.o.   MRN: [id number]  HPI    Review of Systems     Objective:   Physical Exam        Assessment & Plan:   This encounter was created in error - please disregard.

## 2010-10-30 NOTE — Assessment & Plan Note (Signed)
He does not appear, that the patient has had a lipid profile performed in approximately 2 years. I will have the patient return for fasting lipid panel in the next few weeks. I refilled the patient's Pravachol today.

## 2010-11-02 ENCOUNTER — Ambulatory Visit: Payer: BC Managed Care – PPO | Admitting: Ophthalmology

## 2010-11-07 ENCOUNTER — Encounter: Payer: Self-pay | Admitting: Internal Medicine

## 2010-11-13 ENCOUNTER — Other Ambulatory Visit: Payer: BC Managed Care – PPO

## 2010-11-13 DIAGNOSIS — E785 Hyperlipidemia, unspecified: Secondary | ICD-10-CM

## 2010-11-13 DIAGNOSIS — I10 Essential (primary) hypertension: Secondary | ICD-10-CM

## 2010-11-13 LAB — COMPREHENSIVE METABOLIC PANEL
ALT: 14 U/L (ref 0–53)
Albumin: 4.2 g/dL (ref 3.5–5.2)
CO2: 25 mEq/L (ref 19–32)
Calcium: 9.1 mg/dL (ref 8.4–10.5)
Chloride: 100 mEq/L (ref 96–112)
Glucose, Bld: 88 mg/dL (ref 70–99)
Sodium: 137 mEq/L (ref 135–145)
Total Bilirubin: 0.4 mg/dL (ref 0.3–1.2)
Total Protein: 7.2 g/dL (ref 6.0–8.3)

## 2010-11-13 LAB — LIPID PANEL
Cholesterol: 173 mg/dL (ref 0–200)
Triglycerides: 80 mg/dL (ref <150)

## 2010-11-23 NOTE — Op Note (Signed)
NAME:  Anthony Campos, Anthony Campos NO.:  [id number]   MEDICAL RECORD NO.:  [id number]          PATIENT TYPE:  AMB   LOCATION:  [facility]                         FACILITY:  [facility]   PHYSICIAN:  Thomas A. Cornett, M.D.DATE OF BIRTH:  Feb 02, 1956   DATE OF PROCEDURE:  02/13/2005  DATE OF DISCHARGE:                                 OPERATIVE REPORT   PREOPERATIVE DIAGNOSIS:  Right inguinal hernia.   POSTOPERATIVE DIAGNOSIS:  Right inguinal hernia.   PROCEDURE:  Right inguinal hernia repair with mesh.   SURGEON:  Maisie Fus A. Cornett, M.D.   ANESTHESIA:  LMA.   ESTIMATED BLOOD LOSS:  10 mL.   SPECIMENS:  None.   INDICATIONS FOR PROCEDURE:  The patient is a 55 year old male who has had  increasing discomfort from a right inguinal hernia. He is here today to have  it repaired.   DESCRIPTION OF PROCEDURE:  The patient was brought to the operating room and  placed supine. The operative site was marked, it was correct being the right  side and LMA anesthesia was begun. The right inguinal region was prepped and  draped in a sterile fashion. I infiltrated the right inguinal crease with  0.5% Marcaine with epinephrine. A right inguinal crease incision was made,  dissection was carried through the subcutaneous tissues. The inferior  epigastric vessels were identified and ligated. We then dissected down until  the aponeurosis of the external oblique was identified. I infiltrated the  external oblique on the right side with 0.5% Marcaine. A small incision was  made and Metzenbaum scissors were used to open the fascia of the external  oblique. The cord structures were identified and encircled with a 1/2 inch  Penrose drain. I then peeled away the cremasteric muscle from the cord and  identified a sac. There was also a very large lipoma and I dissected this  off the sac as well. The ilioinguinal nerve was identified and divided to  prevent postoperative pain. After identifying the lipoma. I  identified the  indirect hernia sac. This was dissected to the internal ring. I then opened  it and there were no intraabdominal contents or bladder __________. A  pursestring suture of #0 silk was placed along the base of it and the excess  sac was amputated with the sac dropping back into the peritoneal cavity. The  lipoma was then amputated in a similar fashion using a Kelly clamp and 2-0  Vicryl. Hemostasis was excellent. Next the floor was examined and felt to be  weak. __________ mesh was brought on the field and secured to the shelving  edge of the inguinal ligament, the conjoined tendon and the transversalis  abdominis fascia circumferentially with interrupted #0 Novofil suture. A  small slit was cut in the tail with the Ultraprobe and the cord structures  were brought out through that. The tails of the structures were sutured  together with the Novofil as well. The mesh laid very nicely. Irrigation was  used and everything was found to be hemostatic in the wound. At this point  in time, the fascia of the  external oblique was closed with a running 2-0  Vicryl. 3-0 Vicryl pop-offs were used to close the subcutaneous tissue. 4-0  Monocryl was used to close the skin. Sterile dressings were applied. All  final counts of sponge needle and instruments were found to be correct. The  patient was awakened and taken to recovery in satisfactory condition.       TAC/MEDQ  D:  02/13/2005  T:  02/13/2005  Job:  [id number]   cc:   [facility]

## 2011-01-22 ENCOUNTER — Encounter: Payer: Self-pay | Admitting: Internal Medicine

## 2011-02-20 ENCOUNTER — Encounter: Payer: Self-pay | Admitting: Internal Medicine

## 2011-10-03 ENCOUNTER — Ambulatory Visit (INDEPENDENT_AMBULATORY_CARE_PROVIDER_SITE_OTHER): Payer: BC Managed Care – PPO | Admitting: Internal Medicine

## 2011-10-03 ENCOUNTER — Encounter: Payer: Self-pay | Admitting: Internal Medicine

## 2011-10-03 VITALS — BP 129/87 | HR 89 | Temp 97.2°F | Ht 74.5 in | Wt 231.2 lb

## 2011-10-03 DIAGNOSIS — M25512 Pain in left shoulder: Secondary | ICD-10-CM

## 2011-10-03 DIAGNOSIS — E785 Hyperlipidemia, unspecified: Secondary | ICD-10-CM

## 2011-10-03 DIAGNOSIS — I10 Essential (primary) hypertension: Secondary | ICD-10-CM

## 2011-10-03 DIAGNOSIS — M25519 Pain in unspecified shoulder: Secondary | ICD-10-CM

## 2011-10-03 DIAGNOSIS — Z0000 Encounter for general adult medical examination without abnormal findings: Secondary | ICD-10-CM

## 2011-10-03 LAB — COMPREHENSIVE METABOLIC PANEL
Albumin: 4.5 g/dL (ref 3.5–5.2)
Alkaline Phosphatase: 62 U/L (ref 39–117)
BUN: 26 mg/dL — ABNORMAL HIGH (ref 6–23)
CO2: 29 mEq/L (ref 19–32)
Calcium: 9.6 mg/dL (ref 8.4–10.5)
Chloride: 101 mEq/L (ref 96–112)
Glucose, Bld: 105 mg/dL — ABNORMAL HIGH (ref 70–99)
Potassium: 4.2 mEq/L (ref 3.5–5.3)
Sodium: 137 mEq/L (ref 135–145)
Total Protein: 7.2 g/dL (ref 6.0–8.3)

## 2011-10-03 MED ORDER — CYCLOBENZAPRINE HCL 10 MG PO TABS: 10.0000 mg | ORAL_TABLET | Freq: Three times a day (TID) | ORAL | Status: DC | PRN

## 2011-10-03 MED ORDER — TRAMADOL HCL 50 MG PO TABS: 50.0000 mg | ORAL_TABLET | Freq: Four times a day (QID) | ORAL | Status: DC | PRN

## 2011-10-03 NOTE — Progress Notes (Signed)
Patient ID: Joesph Marcy, male   DOB: 1956-01-08, 56 y.o.   MRN: [id number]  HPI:   Patient is a pleasant 56 year old male with past medical history listed below, presents to the outpatient clinic for routine followup, also he complains of left shoulder pain for the past week , this occurred after heavy lifting while moving objects , reports that the pain does not radiate anywhere , is relieved by cold packs, and muscle relaxants . Patient has full range of motion in his  Shoulder. No other complaints today   Review of Systems: Negative except per history of present illness  Physical Exam:  Nursing notes and vitals reviewed General:  alert, well-developed, and cooperative to examination.   MSK: Left shoulder pain, tender to palpation posteriorly, normal range of motion. Lungs:  normal respiratory effort, no accessory muscle use, normal breath sounds, no crackles, and no wheezes. Heart:  normal rate, regular rhythm, no murmurs, no gallop, and no rub.   Abdomen:  soft, non-tender, normal bowel sounds, no distention, no guarding, no rebound tenderness, no hepatomegaly, and no splenomegaly.   Extremities:  No cyanosis, clubbing, edema Neurologic:  alert & oriented X3, nonfocal exam  Meds: Medications Prior to Admission  Medication Sig Dispense Refill  . hydrochlorothiazide 25 MG tablet Take 1 tablet (25 mg total) by mouth daily.  30 tablet  11  . pravastatin (PRAVACHOL) 40 MG tablet Take 1 tablet (40 mg total) by mouth every evening.  30 tablet  11  . sildenafil (VIAGRA) 50 MG tablet Take 1 tablet (50 mg total) by mouth as needed for erectile dysfunction.  2 tablet  11   No current facility-administered medications on file as of 10/03/2011.    Allergies: Review of patient's allergies indicates no known allergies. Past Medical History  Diagnosis Date  . Hyperlipidemia   . Hypertension   . Erectile dysfunction   . Lipoma   . Tobacco abuse   . Infection of skin and subcutaneous tissue     Past Surgical History  Procedure Date  . Hernia repair 2005    Right hernia repair    Family History  Problem Relation Age of Onset  . Depression Sister   . Heart disease Neg Hx   . Hypertension Neg Hx   . Stroke Neg Hx    History   Social History  . Marital Status: Married    Spouse Name: N/A    Number of Children: N/A  . Years of Education: N/A   Occupational History  . Not on file.   Social History Main Topics  . Smoking status: Current Everyday Smoker -- 0.4 packs/day    Types: Cigarettes  . Smokeless tobacco: Not on file   Comment:  has cut back  . Alcohol Use: Yes     on holidays  . Drug Use: No  . Sexually Active: Yes -- Male partner(s)   Other Topics Concern  . Not on file   Social History Narrative   Occupation- Sport and exercise psychologist.Lives with his wife. Has adult children and three grandchildren.

## 2011-10-03 NOTE — Patient Instructions (Signed)
Shoulder Pain The shoulder is a ball and socket joint. The muscles and tendons (rotator cuff) are what keep the shoulder in its joint and stable. This collection of muscles and tendons holds in the head (ball) of the humerus (upper arm bone) in the fossa (cup) of the scapula (shoulder blade). Today no reason was found for your shoulder pain. Often pain in the shoulder may be treated conservatively with temporary immobilization. For example, holding the shoulder in one place using a sling for rest. Physical therapy may be needed if problems continue. HOME CARE INSTRUCTIONS   Apply ice to the sore area for 15 to 20 minutes, 3 to 4 times per day for the first 2 days. Put the ice in a plastic bag. Place a towel between the bag of ice and your skin.   If you have or were given a shoulder sling and straps, do not remove for as long as directed by your caregiver or until you see a caregiver for a follow-up examination. If you need to remove it to shower or bathe, move your arm as little as possible.   Sleep on several pillows at night to lessen swelling and pain.   Only take over-the-counter or prescription medicines for pain, discomfort, or fever as directed by your caregiver.   Keep any follow-up appointments in order to avoid any type of permanent shoulder disability or chronic pain problems.  SEEK MEDICAL CARE IF:   Pain in your shoulder increases or new pain develops in your arm, hand, or fingers.   Your hand or fingers are colder than your other hand.   You do not obtain pain relief with the medications or your pain becomes worse.  SEEK IMMEDIATE MEDICAL CARE IF:   Your arm, hand, or fingers are numb or tingling.   Your arm, hand, or fingers are swollen, painful, or turn white or blue.   You develop chest pain or shortness of breath.  MAKE SURE YOU:   Understand these instructions.   Will watch your condition.   Will get help right away if you are not doing well or get worse.    Document Released: 04/03/2005 Document Revised: 06/13/2011 Document Reviewed: 06/08/2011 [employer] Patient Information 2012 [employer]. 

## 2011-10-03 NOTE — Assessment & Plan Note (Signed)
Well-controlled, check complete metabolic profile today.

## 2011-10-03 NOTE — Assessment & Plan Note (Signed)
Acute and likely musculoskeletal in etiology given history and physical exam. No further workup at this point, we'll prescribe tramadol and Flexeril to take as needed, routine care instructions provided to the patient.

## 2011-10-03 NOTE — Assessment & Plan Note (Signed)
Well controlled on current treatment, No new changes made today, Will continue to monitor.   

## 2011-11-11 ENCOUNTER — Other Ambulatory Visit: Payer: Self-pay | Admitting: Ophthalmology

## 2011-12-17 ENCOUNTER — Other Ambulatory Visit: Payer: Self-pay | Admitting: Ophthalmology

## 2012-04-24 ENCOUNTER — Other Ambulatory Visit: Payer: Self-pay | Admitting: Ophthalmology

## 2012-06-22 ENCOUNTER — Encounter: Payer: Self-pay | Admitting: Internal Medicine

## 2012-06-22 ENCOUNTER — Ambulatory Visit (INDEPENDENT_AMBULATORY_CARE_PROVIDER_SITE_OTHER): Payer: BC Managed Care – PPO | Admitting: Internal Medicine

## 2012-06-22 VITALS — BP 131/88 | HR 100 | Temp 96.8°F | Ht 74.0 in | Wt 236.4 lb

## 2012-06-22 DIAGNOSIS — D179 Benign lipomatous neoplasm, unspecified: Secondary | ICD-10-CM

## 2012-06-22 DIAGNOSIS — E785 Hyperlipidemia, unspecified: Secondary | ICD-10-CM

## 2012-06-22 DIAGNOSIS — N529 Male erectile dysfunction, unspecified: Secondary | ICD-10-CM

## 2012-06-22 DIAGNOSIS — F528 Other sexual dysfunction not due to a substance or known physiological condition: Secondary | ICD-10-CM

## 2012-06-22 DIAGNOSIS — F17200 Nicotine dependence, unspecified, uncomplicated: Secondary | ICD-10-CM

## 2012-06-22 DIAGNOSIS — Z23 Encounter for immunization: Secondary | ICD-10-CM

## 2012-06-22 DIAGNOSIS — I10 Essential (primary) hypertension: Secondary | ICD-10-CM

## 2012-06-22 DIAGNOSIS — R0981 Nasal congestion: Secondary | ICD-10-CM

## 2012-06-22 LAB — LIPID PANEL
HDL: 39 mg/dL — ABNORMAL LOW (ref >39)
LDL Cholesterol: 112 mg/dL — ABNORMAL HIGH (ref 0–99)
Total CHOL/HDL Ratio: 4.8 Ratio

## 2012-06-22 LAB — COMPREHENSIVE METABOLIC PANEL
ALT: 20 U/L (ref 0–53)
AST: 23 U/L (ref 0–37)
Albumin: 4.4 g/dL (ref 3.5–5.2)
Alkaline Phosphatase: 60 U/L (ref 39–117)
BUN: 23 mg/dL (ref 6–23)
CO2: 28 mEq/L (ref 19–32)
Calcium: 9 mg/dL (ref 8.4–10.5)
Chloride: 102 mEq/L (ref 96–112)
Creat: 0.99 mg/dL (ref 0.50–1.35)
Potassium: 3.9 mEq/L (ref 3.5–5.3)
Sodium: 137 mEq/L (ref 135–145)
Total Protein: 6.8 g/dL (ref 6.0–8.3)

## 2012-06-22 MED ORDER — HYDROCHLOROTHIAZIDE 25 MG PO TABS: 25.0000 mg | ORAL_TABLET | Freq: Every day | ORAL | Status: DC

## 2012-06-22 MED ORDER — SILDENAFIL CITRATE 50 MG PO TABS: 25.0000 mg | ORAL_TABLET | ORAL | Status: DC | PRN

## 2012-06-22 MED ORDER — FLUTICASONE PROPIONATE 50 MCG/ACT NA SUSP: 2.0000 | Freq: Every day | NASAL | Status: DC

## 2012-06-22 NOTE — Assessment & Plan Note (Signed)
  Assessment: 1. The patient was counseled on the dangers of tobacco use, which include, but are not limited to cardiovascular disease, increased cancer risk of multiple types of cancer, COPD, peripheral vascular disease, strokes. 2. He was also counseled on the benefits of smoking cessation. 3. Progress toward smoking cessation:   No change 4. Barriers to progress toward smoking cessation:  smoking the same amount  Plan: 1. Instruction/counseling given: The patient was firmly advised to quit and referred to a tobacco cessation program.   2. We also reviewed strategies to maximize success, including:  Removing cigarettes and smoking materials from environment  Stress management  Substitution of other forms of reinforcement Support of family/friends.  Selecting a quit date. 3. Educational resources provided:  handout 4. Self management tools provided:   5. Medications to assist with smoking cessation: None, patient prefers no pharmacologic intervention at this time. 6. Patient agreed to the following self-care plans for smoking cessation: set a quit date and stop smoking

## 2012-06-22 NOTE — Progress Notes (Signed)
   Patient: Anthony Campos   MRN: [id number]  DOB: 1956/06/14  PCP: Judie Bonus, MD   Subjective:    HPI: [name] is a 56 y.o. male with a PMHx of HTN, HLD, tobacco abuse, and ED who presented to clinic today for the following:  1) HTN - Patient does not check blood pressure regularly at home. Currently taking HCTZ 25mg . Patient misses doses 0 x per week on average. denies headaches, dizziness, lightheadedness, chest pain, shortness of breath.  does request refills today.  2) HLD - currently taking Pravastatin 40mg  daily. Patient misses doses 0 x per week on average. denies chest pain, difficulty breathing, palpitations, tachycardia, and muscle pains. does request refills today.   3) Tobacco abuse - patient continues to smoke 1/2 ppd cigarettes per day and has done so for the past 7 years prior to which he had quit for 3 years, and prior to quitting had smoked for . There  have been prior attempts to quit - with successes - had quit cold [place] before fir 3 years. Barriers to quit (if any) include: stress.    Review of Systems: Per HPI.   Current Outpatient Medications: Medication Sig  . hydrochlorothiazide (HYDRODIURIL) 25 MG tablet Take 1 tablet (25 mg total) by mouth daily.  . pravastatin (PRAVACHOL) 40 MG tablet TAKE ONE TABLET BY MOUTH EVERY DAY IN THE EVENING  . VIAGRA 50 MG tablet TAKE ONE TABLET MOUTH AS NEEDED FOR ERECTILE DYSFUNCTION    Allergies: No Known Allergies   Past Medical History  Diagnosis Date  . Hyperlipidemia   . Hypertension   . Erectile dysfunction   . Lipoma   . Tobacco abuse   . Infection of skin and subcutaneous tissue     Past Surgical History  Procedure Date  . Hernia repair 2005    Right hernia repair      Objective:    Physical Exam: Filed Vitals:   06/22/12 1457  BP: 131/88  Pulse: 100  Temp: 96.8 F (36 C)      General: Vital signs reviewed and noted. Well-developed, well-nourished, in no acute distress; alert,  appropriate and cooperative throughout examination.  Head: Normocephalic, atraumatic.  Lungs:  Normal respiratory effort. Clear to auscultation BL without crackles or wheezes.  Heart: RRR. S1 and S2 normal without gallop, rubs. No murmur.  Abdomen:  BS normoactive. Soft, Nondistended, non-tender.  No masses or organomegaly.  Extremities: No pretibial edema.    Assessment/ Plan:   Case and plan of care discussed with attending physician, Dr. Debe Coder.

## 2012-06-22 NOTE — Assessment & Plan Note (Addendum)
Pertinent Labs: Liver Function Tests:    Component Value Date/Time   AST 24 10/03/2011 1140   ALT 19 10/03/2011 1140   ALKPHOS 62 10/03/2011 1140   BILITOT 0.5 10/03/2011 1140   PROT 7.2 10/03/2011 1140   ALBUMIN 4.5 10/03/2011 1140    Lipid Panel:     Component Value Date/Time   CHOL 173 11/13/2010 1403   TRIG 80 11/13/2010 1403   HDL 43 11/13/2010 1403   CHOLHDL 4.0 11/13/2010 1403   VLDL 16 11/13/2010 1403   LDLCALC 114* 11/13/2010 1403     Assessment: Goal LDL (per ATP guidelines): < 130 mg/dL  Disease Control: not controlled  Progress toward goals: unable to assess  Barriers to meeting goals: no barriers identified    Trying to eat better, eating less greasy foods.   Patient is not fasting today - ate two hamburgers at 11:00AM.  Patient is compliant all of the time with prescribed medications.    Plan:  continue current medications  Check lipid panel today.  I will call him after the lipid panel to assess if his medication needs to be escalated.

## 2012-06-22 NOTE — Assessment & Plan Note (Signed)
Pertinent Data: BP Readings from Last 3 Encounters:  06/22/12 131/88  10/03/11 129/87  10/30/10 110/72    Basic Metabolic Panel:    Component Value Date/Time   NA 137 10/03/2011 1140   K 4.2 10/03/2011 1140   CL 101 10/03/2011 1140   CO2 29 10/03/2011 1140   BUN 26* 10/03/2011 1140   CREATININE 0.93 10/03/2011 1140   CREATININE 0.94 03/30/2009 2100   GLUCOSE 105* 10/03/2011 1140   CALCIUM 9.6 10/03/2011 1140    Assessment: Disease Control: controlled  Progress toward goals: at goal  Barriers to meeting goals: no barriers identified    Patient is compliant all of the time with prescribed medications.   Plan:  continue current medications  Check CMET today.  Educational resources provided:    Self management tools provided:

## 2012-06-22 NOTE — Patient Instructions (Addendum)
General Instructions:  Please follow-up at the clinic in 3-6 months, at which time we will reevaluate your blood pressure, hyperlipidemia - OR, please follow-up in the clinic sooner if needed.  There have not been changes in your medications.   You are getting labs today, if they are abnormal I will give you a call.   If you have been started on new medication(s), and you develop symptoms concerning for allergic reaction, including, but not limited to, throat closing, tongue swelling, rash, please stop the medication immediately and call the clinic at [phone], and go to the ER.  If symptoms worsen, or new symptoms arise, please call the clinic or go to the ER.  PLEASE BRING ALL OF YOUR MEDICATIONS  IN A BAG TO YOUR NEXT APPOINTMENT   Treatment Goals:  Goals (1 Years of Data) as of 06/22/2012    None      Progress Toward Treatment Goals:  Treatment Goal 06/22/2012  Blood pressure at goal  Stop smoking smoking the same amount    Self Care Goals & Plans:  Self Care Goal 06/22/2012  Manage my medications take my medicines as prescribed  Monitor my health keep track of my weight  Eat healthy foods eat foods that are low in salt  Be physically active take a walk every day  Stop smoking set a quit date and stop smoking       Care Management & Community Referrals:  LIFESTYLE TIPS TO HELP WITH YOUR BLOOD PRESSURE CONTROL  WEIGHT REDUCTION:  Strategies: A healthy weight loss program includes:  A calorie restricted diet based on individual calorie needs.   Increased physical activity (exercise).  An exercise program is just as important as the right low-calorie diet.    An unhealthy weight loss program includes:  Fasting.   Fad diets.   Supplements and drugs.  These choices do not succeed in long-term weight control.   Home Care Instructions: To help you make the needed dietary changes:   Exercise and perform physical activity as directed by your caregiver.    Keep a daily record of everything you eat. There are many free websites to help you with this. It may be helpful to measure your foods so you can determine if you are eating the correct portion sizes.   Use low-calorie cookbooks or take special cooking classes.   Avoid alcohol. Drink more water and drinks with no calories.   Take vitamins and supplements only as recommended by your caregiver.   Weight loss support groups, Registered Dieticians, counselors, and stress reduction education can also be very helpful.   ________________________________________________________________________  DASH DIET:  The DASH diet stands for "Dietary Approaches to Stop Hypertension." It is a healthy eating plan that has been shown to reduce high blood pressure (hypertension) in as little as 14 days, while also possibly providing other significant health benefits. These other health benefits include reducing the risk of breast cancer after menopause and reducing the risk of type 2 diabetes, heart disease, colon cancer, and stroke. Health benefits also include weight loss and slowing kidney failure in patients with chronic kidney disease.   Diet guidelines: Limit salt (sodium). Your diet should contain less than 1500 mg of sodium daily.  Limit refined or processed carbohydrates. Your diet should include mostly whole grains. Desserts and added sugars should be used sparingly.  Include small amounts of heart-healthy fats. These types of fats include nuts, oils, and tub margarine. Limit saturated and trans fats. These fats have been  shown to be harmful in the body.   Choosing Foods: The following food groups are based on a 2000 calorie diet. See your Registered Dietitian for individual calorie needs.  Grains and Grain Products (6 to 8 servings daily)  Eat More Often: Whole-wheat bread, brown rice, whole-grain or wheat pasta, quinoa, popcorn without added fat or salt (air popped).  Eat Less Often: White bread,  white pasta, white rice, cornbread.  Vegetables (4 to 5 servings daily)  Eat More Often: Fresh, frozen, and canned vegetables. Vegetables may be raw, steamed, roasted, or grilled with a minimal amount of fat.  Eat Less Often/Avoid: Creamed or fried vegetables. Vegetables in a cheese sauce.  Fruit (4 to 5 servings daily)  Eat More Often: All fresh, canned (in natural juice), or frozen fruits. Dried fruits without added sugar. One hundred percent fruit juice ( cup [237 mL] daily).  Eat Less Often: Dried fruits with added sugar. Canned fruit in light or heavy syrup.  [employer], Fish, and Poultry (2 servings or less daily. One serving is 3 to 4 oz [85-114 g]).  Eat More Often: Ninety percent or leaner ground beef, tenderloin, sirloin. Round cuts of beef, chicken breast, [place] breast. All fish. Grill, bake, or broil your meat. Nothing should be fried.  Eat Less Often/Avoid: Fatty cuts of meat, [place], or chicken leg, thigh, or wing. Fried cuts of meat or fish.  Dairy (2 to 3 servings)  Eat More Often: Low-fat or fat-free milk, low-fat plain or light yogurt, reduced-fat or part-skim cheese.  Eat Less Often/Avoid: Milk (whole, 2%, skim, or chocolate). Whole milk yogurt. Full-fat cheeses.  Nuts, Seeds, and Legumes (4 to 5 servings per week)  Eat More Often: All without added salt.  Eat Less Often/Avoid: Salted nuts and seeds, canned beans with added salt.  Fats and Sweets (limited)  Eat More Often: Vegetable oils, tub margarines without trans fats, sugar-free gelatin. Mayonnaise and salad dressings.  Eat Less Often/Avoid: Coconut oils, palm oils, butter, stick margarine, cream, half and half, cookies, candy, pie.   ________________________________________________________________________    TIPS TO HELP YOU QUIT SMOKING 1-800-QUIT-NOW    This document explains the best ways for you to quit smoking and new treatments to help. It lists new medicines that can double or triple your chances of  quitting and quitting for good. It also considers ways to avoid relapses and concerns you may have about quitting, including weight gain.   Nicotine: A Powerful Addiction If you have tried to quit smoking, you know how hard it can be. It is hard because nicotine is a very addictive drug. For some people, it can be as addictive as heroin or cocaine. Usually, people make 2 or 3 tries, or more, before finally being able to quit. Each time you try to quit, you can learn about what helps and what hurts. Quitting takes hard work and a lot of effort, but you can quit smoking.   Quitting smoking is one of the most important things you will ever do You will live longer, feel better, and live better.  The impact on your body of quitting smoking is felt almost immediately:   Five keys to quitting: Studies have shown that these 5 steps will help you quit smoking and quit for good. You have the best chances of quitting if you use them together:   1. GET READY  Set a quit date.  Change your environment.  Get rid of ALL cigarettes, ashtrays, matches, and lighters in your  home, car, and place of work.  Do not let people smoke in your home.  Review your past attempts to quit. Think about what worked and what did not.  Once you quit, do not smoke. NOT EVEN A PUFF!   2. GET SUPPORT AND ENCOURAGEMENT  Tell your family, friends, and coworkers that you are going to quit and need their support. Ask them not to smoke around you.  Get individual, group, or telephone counseling and support.  Many smokers find one or more of the many self-help books available useful in helping them quit and stay off tobacco.   3. LEARN NEW SKILLS AND BEHAVIORS  Try to distract yourself from urges to smoke. Talk to someone, go for a walk, or occupy your time with a task.  When you first try to quit, change your routine. Take a different route to work. Drink tea instead of coffee. Eat breakfast in a different place.  Do something to  reduce your stress. Take a hot bath, exercise, or read a book.  Plan something enjoyable to do every day. Reward yourself for not smoking.  Explore interactive web-based programs that specialize in helping you quit.   4. GET MEDICINE AND USE IT CORRECTLY .  Medicines can help you stop smoking and decrease the urge to smoke. Combining medicine with the above behavioral methods and support can quadruple your chances of successfully quitting smoking.  Talk with your doctor about these options.  5. BE PREPARED FOR RELAPSE OR DIFFICULT SITUATIONS  Most relapses occur within the first 3 months after quitting. Do not be discouraged if you start smoking again. Remember, most people try several times before they finally quit.  You may have symptoms of withdrawal because your body is used to nicotine. You may crave cigarettes, be irritable, feel very hungry, cough often, get headaches, or have difficulty concentrating.  The withdrawal symptoms are only temporary. They are strongest when you first quit, but they will go away within 10 to 14 days.   Quitting takes hard work and a lot of effort, but you can quit smoking.   FOR MORE INFORMATION  Smokefree.gov (http://www.davis-sullivan.com/) provides free, accurate, evidence-based information and professional assistance to help support the immediate and long-term needs of people trying to quit smoking.  Document Released: 06/18/2001 Document Re-Released: 12/12/2009  [facility] Patient Information 2011 [place], [place].   \

## 2012-09-15 ENCOUNTER — Encounter: Payer: Self-pay | Admitting: Internal Medicine

## 2012-09-15 ENCOUNTER — Ambulatory Visit (INDEPENDENT_AMBULATORY_CARE_PROVIDER_SITE_OTHER): Payer: BC Managed Care – PPO | Admitting: Internal Medicine

## 2012-09-15 VITALS — BP 107/77 | HR 101 | Temp 97.1°F | Wt 228.6 lb

## 2012-09-15 DIAGNOSIS — I10 Essential (primary) hypertension: Secondary | ICD-10-CM

## 2012-09-15 DIAGNOSIS — R42 Dizziness and giddiness: Secondary | ICD-10-CM

## 2012-09-15 LAB — GLUCOSE, CAPILLARY: Glucose-Capillary: 117 mg/dL — ABNORMAL HIGH (ref 70–99)

## 2012-09-15 MED ORDER — HYDROCHLOROTHIAZIDE 25 MG PO TABS: 12.5000 mg | ORAL_TABLET | Freq: Every day | ORAL | Status: DC

## 2012-09-15 NOTE — Assessment & Plan Note (Signed)
BP Readings from Last 3 Encounters:  09/15/12 107/77  06/22/12 131/88  10/03/11 129/87    Lab Results  Component Value Date   NA 137 06/22/2012   K 3.9 06/22/2012   CREATININE 0.99 06/22/2012    Assessment:  Blood pressure control: controlled  Progress toward BP goal:  at goal  Plan:  Medications:  Decrease hydrochlorothiazide to 12.5 mg every morning
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
## 2012-09-15 NOTE — Assessment & Plan Note (Signed)
Most likely orthostatic hypotension secondary to over treatment with hydrochlorothiazide. His blood pressures and weights have been trending down of the past few months. It seems he no longer needs such high a dose of diuretic. The stress he endured over the weekend may have contributed as well. Nothing to suggest vertigo. No syncope or near-syncope. - Decrease hydrochlorothiazide to 12.5 mg every morning

## 2012-09-15 NOTE — Patient Instructions (Addendum)
General Instructions:  Start taking only a half of the hydrochlorothiazide (water pill) in the morning.    Buy a pill splitter if you need one to help cut them in half.  If you cannot cut them in half, call the clinic and let us know and I can call in a half-strength pill.    Treatment Goals:  Goals (1 Years of Data) as of 09/15/12         As of Today As of Today As of Today As of Today 06/22/12     Blood Pressure    . Blood Pressure < 140/90  107/77 109/82 115/76 114/73 131/88     Lifestyle    . Quit smoking / using tobacco  No          Progress Toward Treatment Goals:  Treatment Goal 09/15/2012  Blood pressure at goal  Stop smoking smoking the same amount    Self Care Goals & Plans:  Self Care Goal 06/22/2012  Manage my medications take my medicines as prescribed  Monitor my health keep track of my weight  Eat healthy foods eat foods that are low in salt  Be physically active take a walk every day  Stop smoking set a quit date and stop smoking       Care Management & Community Referrals:  Referral 09/15/2012  Referrals made for care management support none needed

## 2013-04-26 ENCOUNTER — Other Ambulatory Visit: Payer: Self-pay | Admitting: *Deleted

## 2013-04-26 DIAGNOSIS — I10 Essential (primary) hypertension: Secondary | ICD-10-CM

## 2013-04-26 NOTE — Telephone Encounter (Signed)
Will decline as the patient was having dizziness with his meds at last visit which was more than 6 months ago and I have never met patient. He needs acute visit to get any meds.  Thanks,  Dr. Dorise Hiss

## 2013-04-26 NOTE — Telephone Encounter (Signed)
Has an appt Nov 14.

## 2013-04-27 NOTE — Telephone Encounter (Signed)
Pt  was called about refusal refill of HCTZ; no answer-message left.

## 2013-04-28 ENCOUNTER — Ambulatory Visit (INDEPENDENT_AMBULATORY_CARE_PROVIDER_SITE_OTHER): Payer: BC Managed Care – PPO | Admitting: Internal Medicine

## 2013-04-28 ENCOUNTER — Encounter: Payer: Self-pay | Admitting: Internal Medicine

## 2013-04-28 VITALS — BP 125/86 | HR 94 | Temp 96.8°F | Ht 74.5 in | Wt 239.0 lb

## 2013-04-28 DIAGNOSIS — M7989 Other specified soft tissue disorders: Secondary | ICD-10-CM

## 2013-04-28 DIAGNOSIS — I872 Venous insufficiency (chronic) (peripheral): Secondary | ICD-10-CM | POA: Insufficient documentation

## 2013-04-28 DIAGNOSIS — Z23 Encounter for immunization: Secondary | ICD-10-CM

## 2013-04-28 DIAGNOSIS — I10 Essential (primary) hypertension: Secondary | ICD-10-CM

## 2013-04-28 DIAGNOSIS — R609 Edema, unspecified: Secondary | ICD-10-CM

## 2013-04-28 DIAGNOSIS — F17200 Nicotine dependence, unspecified, uncomplicated: Secondary | ICD-10-CM

## 2013-04-28 DIAGNOSIS — E875 Hyperkalemia: Secondary | ICD-10-CM

## 2013-04-28 DIAGNOSIS — N529 Male erectile dysfunction, unspecified: Secondary | ICD-10-CM

## 2013-04-28 DIAGNOSIS — L84 Corns and callosities: Secondary | ICD-10-CM

## 2013-04-28 MED ORDER — SILDENAFIL CITRATE 50 MG PO TABS: 25.0000 mg | ORAL_TABLET | ORAL | Status: DC | PRN

## 2013-04-28 MED ORDER — HYDROCHLOROTHIAZIDE 12.5 MG PO TABS: 12.5000 mg | ORAL_TABLET | Freq: Every day | ORAL | Status: DC

## 2013-04-28 NOTE — Progress Notes (Signed)
Patient ID: Anthony Campos, male   DOB: 1956-02-18, 57 y.o.   MRN: [id number]  Subjective:   Patient ID: Anthony Campos male   DOB: 1955/08/13 57 y.o.   MRN: [id number]  HPI: Anthony Campos is a 57 y.o. M with PMH tobacco abuse, ED, HLD, and HTN who presents to the clinic for a blood pressure check.   His HCTZ was decreased from 25-12.5 daily by Dr. Earlene Plater on 09/05/12 secondary to dizziness thought to be from orthostatic hypotension due to the hydrochlorothiazide.  C/o pain under the left foot today.    Past Medical History  Diagnosis Date  . Hyperlipidemia   . Hypertension   . Erectile dysfunction   . Lipoma   . Tobacco abuse   . Infection of skin and subcutaneous tissue    Current Outpatient Prescriptions  Medication Sig Dispense Refill  . fluticasone (FLONASE) 50 MCG/ACT nasal spray Place 2 sprays into the nose daily. FOR NASAL CONGESTION  16 g  2  . hydrochlorothiazide (HYDRODIURIL) 25 MG tablet Take 0.5 tablets (12.5 mg total) by mouth daily.      . pravastatin (PRAVACHOL) 40 MG tablet TAKE ONE TABLET BY MOUTH EVERY DAY IN THE EVENING  30 tablet  6  . sildenafil (VIAGRA) 50 MG tablet Take 0.5 tablets (25 mg total) by mouth as needed for erectile dysfunction.  5 tablet  0   No current facility-administered medications for this visit.   Family History  Problem Relation Age of Onset  . Depression Sister   . Heart disease Neg Hx   . Hypertension Neg Hx   . Stroke Neg Hx    History   Social History  . Marital Status: Married    Spouse Name: N/A    Number of Children: N/A  . Years of Education: N/A   Social History Main Topics  . Smoking status: Current Every Day Smoker -- 0.30 packs/day    Types: Cigarettes  . Smokeless tobacco: None     Comment:  has cut back  . Alcohol Use: Yes     Comment: on holidays  . Drug Use: No  . Sexual Activity: Yes    Partners: Female   Other Topics Concern  . None   Social History Narrative   Occupation- Sport and exercise psychologist.   Lives  with his wife. Has adult children and three grandchildren.   Review of Systems: Constitutional: Denies fever, chills, diaphoresis, appetite change and fatigue.  HEENT: Denies photophobia, eye pain, redness, hearing loss, ear pain, congestion, sore throat, rhinorrhea, sneezing, mouth sores, trouble swallowing, neck pain, neck stiffness and tinnitus.   Respiratory: Denies SOB, DOE, cough, chest tightness,  and wheezing.   Cardiovascular: Denies chest pain, palpitations and leg swelling.  Gastrointestinal: Denies nausea, vomiting, abdominal pain, diarrhea, constipation, blood in stool and abdominal distention.  Genitourinary: Denies dysuria, urgency, frequency, hematuria, flank pain and difficulty urinating.  Endocrine: Denies: hot or cold intolerance, sweats, changes in hair or nails, polyuria, polydipsia. Musculoskeletal: Denies myalgias, back pain, joint swelling, arthralgias and gait problem.  Skin: Denies pallor, rash and wound.  Neurological: Denies dizziness, seizures, syncope, weakness, light-headedness, numbness and headaches.  Hematological: Denies adenopathy. Easy bruising, personal or family bleeding history  Psychiatric/Behavioral: Denies suicidal ideation, mood changes, confusion, nervousness, sleep disturbance and agitation  Objective:  Physical Exam: Filed Vitals:   04/28/13 1426  BP: 125/86  Pulse: 94  Temp: 96.8 F (36 C)  TempSrc: Oral  Height: 6' 2.5" (1.892 m)  Weight: 239 lb (108.41 kg)  SpO2: 97%   Constitutional: Vital signs reviewed.  Patient is a well-developed and well-nourished male in no acute distress and cooperative with exam.   Head: Normocephalic and atraumatic Eyes: PERRL, EOMI, conjunctivae normal, No scleral icterus.  Neck: Trachea midline normal ROM Cardiovascular: RRR. BLE edema, trace in LLE, 1+ in RLE at distal shin Pulmonary/Chest: Normal respiratory effort Abdominal: Soft. Non-distended Musculoskeletal: No joint deformities, erythema, or  stiffness, ROM full. Callus present on the plantar surface of his left foot under his great toe. There is no drainage from the site, and is not open, there is no fluctuance, no induration. It is slightly tender to firm palpation. Skin changes to LLE at the shin. Neurological: A&O x3, nonfocal Psychiatric: Normal mood and affect. speech and behavior is normal. Judgment and thought content normal. Cognition and memory are normal.   Assessment & Plan:   Please refer to Problem List based Assessment and Plan

## 2013-04-28 NOTE — Assessment & Plan Note (Signed)
BP Readings from Last 3 Encounters:  04/28/13 125/86  09/15/12 107/77  06/22/12 131/88    Lab Results  Component Value Date   NA 137 06/22/2012   K 3.9 06/22/2012   CREATININE 0.99 06/22/2012    Assessment: Blood pressure control: controlled Progress toward BP goal:  at goal   Plan: Medications:  continue current medications Educational resources provided: brochure Self management tools provided: home blood pressure logbook

## 2013-04-28 NOTE — Assessment & Plan Note (Addendum)
Callus present on the plantar surface of his left foot under his great toe. There is no drainage from the site, and is not open, there is no fluctuance, no induration. It is slightly tender to firm palpation. He does not have a history of diabetes, his exam is not consistent with a developing infection. He has seen a podiatrist in the past for calluses on his feet. The patient states the he will call this podiatrist and set up a followup appointment to have the calluses shaved down.

## 2013-04-28 NOTE — Assessment & Plan Note (Deleted)
Potassium is 6.1. Will treat with 2 L of normal saline, and 30 mg of Kayexalate in the clinic. She is to follow up in the morning in the clinic for a recheck of her BMP.

## 2013-04-28 NOTE — Assessment & Plan Note (Addendum)
Patient states with his work he is on his feet all day long. He's noticed some swelling of his right lower extremity. On exam he does have 1+ pitting edema of his right lower charity at the distal shin, which is not present at the site where his boot and was compressing his leg. This seems more like dependent edema secondary to being on his feet all day long. He does have some mild nonpitting edema in his lower extremity. He's not wear compression stockings/socks.  - Encouraged patient to elevate his legs when possible - Recommend patient purchase compression stockings/socks from a local pharmacy or medical supply company - He is to call the clinic for further workup if his symptoms don't improve with these interventions

## 2013-04-28 NOTE — Patient Instructions (Signed)
**  Continue to take the hydrochlorothiazide 12.5 milligrams by mouth daily  **Be sure to call your podiatrist and set up a follow up appointment to have her calluses taking care of.  ** When you are able, try to keep your legs elevated, this will help the swelling especially after being on her feet all day long. You can also buy compression stockings, or socks from the local pharmacy or medical equipment supply store. If the swelling persist please call the clinic and schedule a follow up appointment

## 2013-04-29 NOTE — Progress Notes (Signed)
Case discussed with Dr. Glenn soon after the resident saw the patient.  We reviewed the resident's history and exam and pertinent patient test results.  I agree with the assessment, diagnosis and plan of care documented in the resident's note. 

## 2013-05-21 ENCOUNTER — Encounter: Payer: Self-pay | Admitting: Internal Medicine

## 2013-05-21 ENCOUNTER — Ambulatory Visit (INDEPENDENT_AMBULATORY_CARE_PROVIDER_SITE_OTHER): Payer: BC Managed Care – PPO | Admitting: Internal Medicine

## 2013-05-21 VITALS — BP 118/83 | HR 93 | Temp 97.2°F | Ht 74.5 in | Wt 234.5 lb

## 2013-05-21 DIAGNOSIS — E785 Hyperlipidemia, unspecified: Secondary | ICD-10-CM

## 2013-05-21 DIAGNOSIS — I10 Essential (primary) hypertension: Secondary | ICD-10-CM

## 2013-05-21 DIAGNOSIS — F17200 Nicotine dependence, unspecified, uncomplicated: Secondary | ICD-10-CM

## 2013-05-21 LAB — BASIC METABOLIC PANEL WITH GFR
Calcium: 9.3 mg/dL (ref 8.4–10.5)
Chloride: 102 mEq/L (ref 96–112)
Creat: 0.93 mg/dL (ref 0.50–1.35)
GFR, Est Non African American: >89 mL/min
Sodium: 137 mEq/L (ref 135–145)

## 2013-05-21 LAB — LIPID PANEL
Cholesterol: 214 mg/dL — ABNORMAL HIGH (ref 0–200)
Triglycerides: 151 mg/dL — ABNORMAL HIGH (ref <150)

## 2013-05-21 NOTE — Patient Instructions (Signed)
We will see you back in 6-12 months. You are doing great!  We will check on your cholesterol today and call you if there are any problems.   If you are feeling sick or have problems please call us and come in sooner at [phone].  Exercise to Stay Healthy Exercise helps you become and stay healthy. EXERCISE IDEAS AND TIPS Choose exercises that:  You enjoy.  Fit into your day. You do not need to exercise really hard to be healthy. You can do exercises at a slow or medium level and stay healthy. You can:  Stretch before and after working out.  Try yoga, Pilates, or tai chi.  Lift weights.  Walk fast, swim, jog, run, climb stairs, bicycle, dance, or rollerskate.  Take aerobic classes. Exercises that burn about 150 calories:  Running 1  miles in 15 minutes.  Playing volleyball for 45 to 60 minutes.  Washing and waxing a car for 45 to 60 minutes.  Playing touch football for 45 minutes.  Walking 1  miles in 35 minutes.  Pushing a stroller 1  miles in 30 minutes.  Playing basketball for 30 minutes.  Raking leaves for 30 minutes.  Bicycling 5 miles in 30 minutes.  Walking 2 miles in 30 minutes.  Dancing for 30 minutes.  Shoveling snow for 15 minutes.  Swimming laps for 20 minutes.  Walking up stairs for 15 minutes.  Bicycling 4 miles in 15 minutes.  Gardening for 30 to 45 minutes.  Jumping rope for 15 minutes.  Washing windows or floors for 45 to 60 minutes. Document Released: 07/27/2010 Document Revised: 09/16/2011 Document Reviewed: 07/27/2010 [facility] Patient Information 2014 [place], [place].

## 2013-05-21 NOTE — Progress Notes (Signed)
Subjective:     Patient ID: Anthony Campos, male   DOB: May 03, 1956, 57 y.o.   MRN: [id number]  HPI The patient is a 57 YO man who is new to this provider coming in for a follow up visit. He has PMH of ED, HTN, tobacco use, hyperlipidemia. He is not have any chest pain, SOB, nausea, vomiting, headache. He does not have any complaints at today's visit except that viagra is very expensive and he was only able to afford a few pills.   Review of Systems  Constitutional: Negative for fever, chills, diaphoresis, activity change, appetite change, fatigue and unexpected weight change.  HENT: Negative.   Eyes: Negative.   Respiratory: Negative for cough, chest tightness, shortness of breath and wheezing.   Cardiovascular: Negative for chest pain, palpitations and leg swelling.  Gastrointestinal: Negative for nausea, vomiting, abdominal pain, diarrhea and constipation.  Musculoskeletal: Negative.   Skin: Negative.   Neurological: Negative for dizziness, tremors, seizures, syncope, facial asymmetry, speech difficulty, weakness, light-headedness, numbness and headaches.       Objective:   Physical Exam  Constitutional: He is oriented to person, place, and time. He appears well-developed and well-nourished. No distress.  HENT:  Head: Atraumatic.  Eyes: EOM are normal. Pupils are equal, round, and reactive to light. Right eye exhibits no discharge. Left eye exhibits no discharge.  Neck: Normal range of motion. Neck supple. No JVD present. No thyromegaly present.  Cardiovascular: Normal rate and regular rhythm.   No murmur heard. Pulmonary/Chest: Effort normal and breath sounds normal. No respiratory distress. He has no wheezes. He has no rales. He exhibits no tenderness.  Abdominal: Soft. Bowel sounds are normal. He exhibits no distension. There is no tenderness. There is no rebound.  Musculoskeletal: Normal range of motion. He exhibits no edema and no tenderness.  Neurological: He is alert and oriented  to person, place, and time. No cranial nerve deficit.  Skin: Skin is warm and dry. He is not diaphoretic.       Assessment/Plan:   1. Please see problem oriented charting.  2. Disposition-patient be seen back in 6-12 months. Will check lipid panel and basic metabolic panel at today's visit. No changes to medications at today's visit.

## 2013-05-24 NOTE — Assessment & Plan Note (Signed)
Recheck lipid panel at today's visit.

## 2013-05-24 NOTE — Assessment & Plan Note (Signed)
  Assessment: Progress toward smoking cessation:  smoking the same amount Barriers to progress toward smoking cessation:  lack of motivation to quit Comments: Hard for patient to quit due to upcoming holidays.  Plan: Instruction/counseling given:  I counseled patient on the dangers of tobacco use, advised patient to stop smoking, and reviewed strategies to maximize success. Educational resources provided:    Self management tools provided:    Medications to assist with smoking cessation:  None Patient agreed to the following self-care plans for smoking cessation: cut down the number of cigarettes smoked  Other plans:

## 2013-05-24 NOTE — Assessment & Plan Note (Signed)
BP Readings from Last 3 Encounters:  05/21/13 118/83  04/28/13 125/86  09/15/12 107/77    Lab Results  Component Value Date   NA 137 05/21/2013   K 3.8 05/21/2013   CREATININE 0.93 05/21/2013    Assessment: Blood pressure control: controlled Progress toward BP goal:  at goal Comments:   Plan: Medications:  continue current medications, HCTZ 12.5 mg daily Educational resources provided:   Self management tools provided: instructions for home blood pressure monitoring Other plans:

## 2013-05-25 NOTE — Progress Notes (Signed)
Case discussed with Dr. Kollar soon after the resident saw the patient.  We reviewed the resident's history and exam and pertinent patient test results.  I agree with the assessment, diagnosis, and plan of care documented in the resident's note. 

## 2013-11-10 ENCOUNTER — Encounter: Payer: Self-pay | Admitting: Internal Medicine

## 2013-11-10 ENCOUNTER — Ambulatory Visit (INDEPENDENT_AMBULATORY_CARE_PROVIDER_SITE_OTHER): Payer: BC Managed Care – PPO | Admitting: Internal Medicine

## 2013-11-10 VITALS — BP 138/104 | HR 83 | Temp 97.3°F | Wt 226.1 lb

## 2013-11-10 DIAGNOSIS — M79609 Pain in unspecified limb: Secondary | ICD-10-CM

## 2013-11-10 DIAGNOSIS — W108XXA Fall (on) (from) other stairs and steps, initial encounter: Secondary | ICD-10-CM

## 2013-11-10 NOTE — Assessment & Plan Note (Signed)
Muscular tension without single point tenderness on exam of left elbow region (full ROM) and of left lower back/buttocks (limited ROM due to pain).  Bruise/bump on head has resolved.   -Supportive therapy with warm bath, icy hot, heating pad, aleve 440 bidwc x 7-10d -rest - out of work until 11/18/13 - heavy lifting in house keeping dept at [employer] -RTC if no improvement, otherwise return in 1 month for routine (BP elevated today in setting of pain - no BP med changes at this time)

## 2013-11-10 NOTE — Patient Instructions (Signed)
-   For your pain, continue warm baths and heating pads to affected areas.  If you use icy hot, do not place a heating pad on the area until the icy hot has worn off  -You may take Aleve (Generic is called naproxen) 440mg  twice daily with food - do not take for more than 7-10 days  -I have provided you with a work note to return to work on 11/18/13 - if no improvement early next week, let us know.  Please be sure to bring all of your medications with you to every visit.  Should you have any new or worsening symptoms, please be sure to call the clinic at [phone].

## 2013-11-10 NOTE — Progress Notes (Signed)
Subjective:   Patient ID: Kamsiyochukwu Spickler male   DOB: Mar 28, 1956 58 y.o.   MRN: [id number]  Chief Complaint  Patient presents with  . Back Pain    low back pain s/p fall down stairs on 05/02 radiates to left buttocks.      HPI: Mr.Datron Febles is a 58 y.o. man with h/o HTN, HLD and tobacco abuse who presents after a recent fall.   Last Saturday, was moving laundry machine upstairs and slipped at the top of the stairs and tumbled down the stairs.  Bruised left elbow, bumped head (saw stars), low back was hurting, tail bone sore, left buttocks sore - difficult with bending over.  Did soak in hot tub & uses heating pad, which helped but did not resolve.  Believes that he is doing slightly better.  Has also been using rubbing alcohol.  Tried icy hot helped as well.  Took 2 aleve on Saturday, which helped.  No break in skin.  No LOC.   Patient last seen 05/21/13 by PCP for HTN.    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   Review of Systems: Constitutional: Denies fever, chills, diaphoresis, appetite change and fatigue.  HEENT: Denies photophobia, eye pain, redness, hearing loss, ear pain, congestion, sore throat, rhinorrhea, sneezing, mouth sores, trouble swallowing, neck pain, neck stiffness and tinnitus.  Respiratory: Denies SOB, DOE, cough, chest tightness, and wheezing.  Cardiovascular: Denies chest pain, palpitations and leg swelling.  Gastrointestinal: Denies nausea, vomiting, abdominal pain, diarrhea, constipation,blood in stool and abdominal distention.  Genitourinary: Denies dysuria, urgency, frequency, hematuria, flank pain and difficulty urinating.  Musculoskeletal: Denies myalgias, back pain, joint swelling, arthralgias and gait problem.  Skin: Denies pallor, rash and wound.  Neurological: Denies dizziness, seizures, syncope, weakness, lightheadedness, numbness and headaches.  Hematological: no s/s of bleeding   Past Medical History    Diagnosis Date  . Hyperlipidemia   . Hypertension   . Erectile dysfunction   . Lipoma   . Tobacco abuse   . Infection of skin and subcutaneous tissue    Current Outpatient Prescriptions  Medication Sig Dispense Refill  . fluticasone (FLONASE) 50 MCG/ACT nasal spray Place 2 sprays into the nose daily. FOR NASAL CONGESTION  16 g  2  . hydrochlorothiazide (HYDRODIURIL) 12.5 MG tablet Take 1 tablet (12.5 mg total) by mouth daily.  30 tablet  6  . sildenafil (VIAGRA) 50 MG tablet Take 0.5 tablets (25 mg total) by mouth as needed for erectile dysfunction.  10 tablet  1   No current facility-administered medications for this visit.   Family History  Problem Relation Age of Onset  . Depression Sister   . Heart disease Neg Hx   . Hypertension Neg Hx   . Stroke Neg Hx    History   Social History  . Marital Status: Married    Spouse Name: N/A    Number of Children: N/A  . Years of Education: N/A   Social History Main Topics  . Smoking status: Current Every Day Smoker -- 0.20 packs/day    Types: Cigarettes  . Smokeless tobacco: Not on file     Comment:  has cut back  . Alcohol Use: Yes     Comment: on holidays  . Drug Use: No  . Sexual Activity: Not on file   Other Topics Concern  . Not on file   Social History Narrative   Occupation- Radiation protection practitioner.   Lives with his  wife. Has adult children and three grandchildren.    Objective:  Physical Exam: Filed Vitals:   11/10/13 1317  BP: 138/104  Pulse: 83  Temp: 97.3 F (36.3 C)  TempSrc: Oral  Weight: 226 lb 1.6 oz (102.558 kg)  SpO2: 99%   General: Leaned over the right side, off loading of left side HEENT: PERRL, EOMI, no scleral icterus Cardiac: RRR, no rubs, murmurs or gallops Pulm: clear to auscultation bilaterally, moving normal volumes of air Abd: soft, nontender, nondistended, BS normoactive Ext: warm and well perfused, no pedal edema Neuro: alert and oriented X3, cranial nerves II-XII grossly  intact, good ROM throughout except left hip flexion and extension limited by pain Skin: no broken skin MSK: left low back muscles tense to palpation  Assessment & Plan:   Case and care discussed with Dr. Ellwood Dense.  Please see problem oriented charting for further details. Patient to return in 1 month for routine HTN follow up, sooner if acute issue not resolved.

## 2013-11-11 NOTE — Progress Notes (Signed)
Case discussed with Dr. Sharda at the time of the visit.  We reviewed the resident's history and exam and pertinent patient test results.  I agree with the assessment, diagnosis, and plan of care documented in the resident's note.    

## 2013-11-17 ENCOUNTER — Encounter: Payer: Self-pay | Admitting: Internal Medicine

## 2013-11-17 ENCOUNTER — Ambulatory Visit (INDEPENDENT_AMBULATORY_CARE_PROVIDER_SITE_OTHER): Payer: BC Managed Care – PPO | Admitting: Internal Medicine

## 2013-11-17 ENCOUNTER — Ambulatory Visit (HOSPITAL_COMMUNITY)
Admission: RE | Admit: 2013-11-17 | Discharge: 2013-11-17 | Disposition: A | Discharge status: Home / Self Care | Payer: BC Managed Care – PPO | Source: Ambulatory Visit | Attending: Internal Medicine | Admitting: Internal Medicine

## 2013-11-17 VITALS — BP 132/92 | HR 92 | Temp 97.0°F | Wt 223.7 lb

## 2013-11-17 DIAGNOSIS — M25529 Pain in unspecified elbow: Secondary | ICD-10-CM | POA: Insufficient documentation

## 2013-11-17 DIAGNOSIS — W108XXA Fall (on) (from) other stairs and steps, initial encounter: Secondary | ICD-10-CM

## 2013-11-17 DIAGNOSIS — W19XXXA Unspecified fall, initial encounter: Secondary | ICD-10-CM | POA: Insufficient documentation

## 2013-11-17 DIAGNOSIS — S3992XA Unspecified injury of lower back, initial encounter: Secondary | ICD-10-CM

## 2013-11-17 DIAGNOSIS — M7090 Unspecified soft tissue disorder related to use, overuse and pressure of unspecified site: Secondary | ICD-10-CM

## 2013-11-17 DIAGNOSIS — M79609 Pain in unspecified limb: Secondary | ICD-10-CM

## 2013-11-17 DIAGNOSIS — Z043 Encounter for examination and observation following other accident: Secondary | ICD-10-CM | POA: Insufficient documentation

## 2013-11-17 NOTE — Progress Notes (Signed)
Subjective:   Patient ID: Jozsef Wescoat male   DOB: 1956/03/13 58 y.o.   MRN: [id number]  HPI: Mr.Travers Bendorf is a 58 y.o. man with h/o HTN, HLD and tobacco abuse who presents after a recent fall.   I saw patient on 11/10/13 for this problem (he fell down stairs on 11/06/13 as he was trying to move a washing machine up stairs).  At that time, he described pain in his left elbow with low back pain and sore tail bone with difficulty bending over.    Seemed to be getting better with aleve and hot baths, but yesterday lifted a  Chair, dropped the chair- pain shot from left side in cheek bone into left quad.  Pain was improving, but currently a 7/10.  Has been taking Aleve 2 pills TID.  Right elbow remains sore as well.  Doesn't feel weakness, just pain.  He has also tried heat, icy hot  Review of Systems: Constitutional: Denies fever, chills, diaphoresis, appetite change and fatigue.  HEENT: Denies photophobia, eye pain, redness, hearing loss, ear pain, congestion, sore throat, rhinorrhea, sneezing, mouth sores, trouble swallowing, neck pain, neck stiffness and tinnitus.  Respiratory: Denies SOB, DOE, cough, chest tightness, and wheezing.  Cardiovascular: Denies chest pain, palpitations and leg swelling.  Gastrointestinal: Denies nausea, vomiting, abdominal pain, diarrhea, constipation,blood in stool and abdominal distention. No incontinence.   Genitourinary: Denies dysuria, urgency, frequency, hematuria, flank pain and difficulty urinating. No incontinence. Musculoskeletal: Denies myalgias, back pain, joint swelling, arthralgias and gait problem.  Skin: Denies pallor, rash and wound.  Neurological: Denies dizziness, seizures, syncope, weakness, lightheadedness, numbness and headaches.  Hematological: No obvious bleeding   Past Medical History  Diagnosis Date  . Hyperlipidemia   . Hypertension   . Erectile dysfunction   . Lipoma   . Tobacco abuse   . Infection of skin and subcutaneous  tissue    Current Outpatient Prescriptions  Medication Sig Dispense Refill  . fluticasone (FLONASE) 50 MCG/ACT nasal spray Place 2 sprays into the nose daily. FOR NASAL CONGESTION  16 g  2  . hydrochlorothiazide (HYDRODIURIL) 12.5 MG tablet Take 1 tablet (12.5 mg total) by mouth daily.  30 tablet  6  . sildenafil (VIAGRA) 50 MG tablet Take 0.5 tablets (25 mg total) by mouth as needed for erectile dysfunction.  10 tablet  1   No current facility-administered medications for this visit.   Family History  Problem Relation Age of Onset  . Depression Sister   . Heart disease Neg Hx   . Hypertension Neg Hx   . Stroke Neg Hx    History   Social History  . Marital Status: Married    Spouse Name: N/A    Number of Children: N/A  . Years of Education: N/A   Social History Main Topics  . Smoking status: Current Every Day Smoker -- 0.25 packs/day    Types: Cigarettes  . Smokeless tobacco: Not on file     Comment:  has cut back  . Alcohol Use: Yes     Comment: on holidays  . Drug Use: No  . Sexual Activity: Not on file   Other Topics Concern  . Not on file   Social History Narrative   Occupation- Radiation protection practitioner.   Lives with his wife. Has adult children and three grandchildren.    Objective:  Physical Exam: Filed Vitals:   11/17/13 1441  BP: 132/92  Pulse: 92  Temp: 97 F (36.1 C)  TempSrc: Oral  Weight: 223 lb 11.2 oz (101.47 kg)  SpO2: 99%   General: Leaned over the right side, off loading of left side again HEENT: PERRL, EOMI, no scleral icterus  Cardiac: RRR, no rubs, murmurs or gallops  Pulm: clear to auscultation bilaterally, moving normal volumes of air  Abd: soft, nontender, nondistended, BS normoactive  Ext: warm and well perfused, no pedal edema  Neuro: alert and oriented X3, cranial nerves II-XII grossly intact, good ROM throughout and left hip flexion and extension improved Skin: no broken skin  MSK: left low back muscles tense to palpation, point  tenderness over left sacral region   Assessment & Plan:  Case and care discussed with Dr. Marinda Elk.  Please see problem oriented charting for further details. Patient to return on June 5 for PCP appt.

## 2013-11-17 NOTE — Patient Instructions (Signed)
-   Please go to radiology to have your tail bone and left elbow imaged.  - For your pain, continue warm baths and heating pads to affected areas. If you use icy hot, do not place a heating pad on the area until the icy hot has worn off   -You may take Aleve (Generic is called naproxen) 440mg  twice daily with food - do not take for more than 7-10 days   -I have provided you with a work note to return to cover your being out of work from 11/08/13-11/25/13 - if no improvement early next week, let us know.   Please be sure to bring all of your medications with you to every visit.  Should you have any new or worsening symptoms, please be sure to call the clinic at [phone].

## 2013-11-17 NOTE — Assessment & Plan Note (Signed)
Pain was somewhat improving but deteriorated after activity (lifiting chair).  Also, persistent tail bone pain and left elbow pain, unable to sit up properly.    -Coccyx/Sacrum + left elbow XR to r/o fracture -Continue supportive therapy as listed last visit, but reiterated aleve should be BID not TID -Will keep out of work an additional week, will write note to cover 11/08/13-11/25/13. -RTC if no improvement, may need to consider PT -Patient has Disability claim form for me to fill out - will be done 11/18/13 after imaging resulted -Return in 1 month for BP check, no changes today

## 2013-11-18 NOTE — Progress Notes (Signed)
Case discussed with Dr. Sharda soon after the resident saw the patient.  We reviewed the resident's history and exam and pertinent patient test results.  I agree with the assessment, diagnosis, and plan of care documented in the resident's note. 

## 2013-12-06 ENCOUNTER — Other Ambulatory Visit: Payer: Self-pay | Admitting: *Deleted

## 2013-12-06 DIAGNOSIS — I10 Essential (primary) hypertension: Secondary | ICD-10-CM

## 2013-12-06 NOTE — Telephone Encounter (Signed)
Pt has an appt Friday 12/10/13.

## 2013-12-08 MED ORDER — HYDROCHLOROTHIAZIDE 12.5 MG PO TABS: 12.5000 mg | ORAL_TABLET | Freq: Every day | ORAL | Status: DC

## 2013-12-10 ENCOUNTER — Ambulatory Visit (INDEPENDENT_AMBULATORY_CARE_PROVIDER_SITE_OTHER): Payer: BC Managed Care – PPO | Admitting: Internal Medicine

## 2013-12-10 ENCOUNTER — Encounter: Payer: Self-pay | Admitting: Internal Medicine

## 2013-12-10 VITALS — BP 112/74 | HR 76 | Temp 97.3°F | Ht 74.0 in | Wt 223.7 lb

## 2013-12-10 DIAGNOSIS — W108XXA Fall (on) (from) other stairs and steps, initial encounter: Secondary | ICD-10-CM

## 2013-12-10 DIAGNOSIS — M25539 Pain in unspecified wrist: Secondary | ICD-10-CM

## 2013-12-10 DIAGNOSIS — I10 Essential (primary) hypertension: Secondary | ICD-10-CM

## 2013-12-10 DIAGNOSIS — F17200 Nicotine dependence, unspecified, uncomplicated: Secondary | ICD-10-CM

## 2013-12-10 NOTE — Patient Instructions (Signed)
We have refilled your medicines. If you are feeling sick or have any accidents please call our office to come in.   If you are feeling well it is fine for you to wait for 6-12 months to come back to the clinic.   Call us with any questions or problems at [phone].  Work on quitting smoking to help have a healthier life!

## 2013-12-10 NOTE — Assessment & Plan Note (Signed)
Advised patient to quit although he does not wish to at this time.

## 2013-12-10 NOTE — Assessment & Plan Note (Signed)
Patient is nearly 100% fully recovered and has only minimal elbow pain for which bengay is effective. Advised him to ice if he is using the elbow a lot at work to prevent inflammation with overuse.

## 2013-12-10 NOTE — Progress Notes (Signed)
Subjective:     Patient ID: Anthony Campos, male   DOB: 1955/08/12, 58 y.o.   MRN: [id number]  HPI The patient is a 58 YO man who is coming in to follow up on his blood pressure. He has PMH of ED, HTN, tobacco use, hyperlipidemia. He is not have any chest pain, SOB, nausea, vomiting, headache. He does not have any complaints at today's visit. After his fall last month he has been able to go back to work successfully and is doing well. He occasionally uses bengay on his elbow. He also thinks he may be due for colonoscopy soon and we discuss that he needs to go back when he turns 58. Denies any change in his stooling habits and is regular.    Review of Systems  Constitutional: Negative for fever, chills, diaphoresis, activity change, appetite change, fatigue and unexpected weight change.  HENT: Negative.   Eyes: Negative.   Respiratory: Negative for cough, chest tightness, shortness of breath and wheezing.   Cardiovascular: Negative for chest pain, palpitations and leg swelling.  Gastrointestinal: Negative for nausea, vomiting, abdominal pain, diarrhea and constipation.  Musculoskeletal: Negative.   Skin: Negative.   Neurological: Negative for dizziness, tremors, seizures, syncope, facial asymmetry, speech difficulty, weakness, light-headedness, numbness and headaches.       Objective:   Physical Exam  Constitutional: He is oriented to person, place, and time. He appears well-developed and well-nourished. No distress.  HENT:  Head: Atraumatic.  Eyes: EOM are normal. Pupils are equal, round, and reactive to light. Right eye exhibits no discharge. Left eye exhibits no discharge.  Neck: Normal range of motion. Neck supple. No JVD present. No thyromegaly present.  Cardiovascular: Normal rate and regular rhythm.   No murmur heard. Pulmonary/Chest: Effort normal and breath sounds normal. No respiratory distress. He has no wheezes. He has no rales. He exhibits no tenderness.  Abdominal: Soft. Bowel  sounds are normal. He exhibits no distension. There is no tenderness. There is no rebound.  Musculoskeletal: Normal range of motion. He exhibits no edema and no tenderness.  Neurological: He is alert and oriented to person, place, and time. No cranial nerve deficit.  Skin: Skin is warm and dry. He is not diaphoretic.       Assessment/Plan:   1. Please see problem oriented charting.  2. Disposition-patient be seen back in 6-12 months. No changes to medications at today's visit.

## 2013-12-10 NOTE — Assessment & Plan Note (Signed)
BP well controlled on HCTZ 12.5 mg daily and will continue. Return in 6-12 months for routine visit and labs.

## 2013-12-16 NOTE — Progress Notes (Signed)
Case discussed with Dr. Kollar soon after the resident saw the patient.  We reviewed the resident's history and exam and pertinent patient test results.  I agree with the assessment, diagnosis, and plan of care documented in the resident's note. 

## 2014-07-12 ENCOUNTER — Other Ambulatory Visit: Payer: Self-pay | Admitting: *Deleted

## 2014-07-12 DIAGNOSIS — I10 Essential (primary) hypertension: Secondary | ICD-10-CM

## 2014-07-13 MED ORDER — HYDROCHLOROTHIAZIDE 12.5 MG PO TABS: 12.5000 mg | ORAL_TABLET | Freq: Every day | ORAL | Status: DC

## 2014-07-15 ENCOUNTER — Ambulatory Visit (INDEPENDENT_AMBULATORY_CARE_PROVIDER_SITE_OTHER): Payer: BC Managed Care – PPO | Admitting: Internal Medicine

## 2014-07-15 ENCOUNTER — Encounter: Payer: Self-pay | Admitting: Internal Medicine

## 2014-07-15 VITALS — BP 148/86 | HR 82 | Temp 97.5°F | Ht 74.0 in | Wt 232.2 lb

## 2014-07-15 DIAGNOSIS — F17200 Nicotine dependence, unspecified, uncomplicated: Secondary | ICD-10-CM

## 2014-07-15 DIAGNOSIS — E785 Hyperlipidemia, unspecified: Secondary | ICD-10-CM

## 2014-07-15 DIAGNOSIS — M25562 Pain in left knee: Secondary | ICD-10-CM

## 2014-07-15 DIAGNOSIS — N529 Male erectile dysfunction, unspecified: Secondary | ICD-10-CM

## 2014-07-15 DIAGNOSIS — I10 Essential (primary) hypertension: Secondary | ICD-10-CM

## 2014-07-15 DIAGNOSIS — F528 Other sexual dysfunction not due to a substance or known physiological condition: Secondary | ICD-10-CM

## 2014-07-15 DIAGNOSIS — F17210 Nicotine dependence, cigarettes, uncomplicated: Secondary | ICD-10-CM

## 2014-07-15 MED ORDER — VARDENAFIL HCL 10 MG PO TABS: 10.0000 mg | ORAL_TABLET | ORAL | Status: DC | PRN

## 2014-07-15 NOTE — Assessment & Plan Note (Signed)
Last lipids in 05/2013 showed: Cholesterol 214 Triglycerides 151 HDL 43 LDL 141  - Repeat lipid profile ordered today (will not be fasting, as patient starts work at 4:00AM and cannot schedule a time to come in while fasting)

## 2014-07-15 NOTE — Progress Notes (Signed)
Subjective:     Patient ID: Anthony Campos, male   DOB: 1955/10/04, 59 y.o.   MRN: [id number]  HPI [name] is a 59 yo man with erectile dysfunction, hypertension, hyperlipidemia and tobacco abuse who presents to clinic for general check-up. He has been managing his hypertension with hydrochlorothiazide. He denies chest pain, shortness of breath, headache or changes in vision. His biggest concern is a bit of pain on the medial inferior side of his knee. He first noticed the pain last night when he turned to say something to his wife. The pain persisted today, but did respond to Aleve. He has not tried icing or heating the area. This pain has never occurred in the past. Finally, he would like a refill on his Viagra (he has pills remaining from his 2014 prescription, but is concerned that they are likely expired).   [name] was previously a 2ppd cigarette smoker, but has recently cut back to 1 pack every 4 days. He mentions that the new year has inspired him even further. He would like to eventually quit smoking. He is not interested in a patch or other form of smoking cessation aid at this time. He verbalized understanding of the multiple health benefits specific to him that quitting would attain.  [name] is due for a colonscopy in 2017.  Review of Systems  Constitutional: Negative for fever, activity change, appetite change, fatigue and unexpected weight change.  HENT: Positive for congestion. Negative for ear pain, sneezing and sore throat.        Currently has "a cold"  Eyes: Negative.   Respiratory: Positive for cough. Negative for chest tightness, shortness of breath and wheezing.   Cardiovascular: Negative.   Gastrointestinal: Negative.   Genitourinary: Negative.   Musculoskeletal:       Left medial inferior knee pain since last night Worst on movement  Skin: Negative for rash and wound.  Neurological: Negative for weakness and headaches.  Psychiatric/Behavioral: Negative.         Objective:   Physical Exam  Constitutional: He is oriented to person, place, and time. He appears well-developed and well-nourished. No distress.  HENT:  Head: Normocephalic and atraumatic.  Eyes: Conjunctivae and EOM are normal. Pupils are equal, round, and reactive to light.  Neck: Normal range of motion. Neck supple.  Cardiovascular: Normal rate, regular rhythm and normal heart sounds.   No murmur heard. Pulmonary/Chest: Effort normal and breath sounds normal. No respiratory distress. He has no wheezes.  Abdominal: Soft. Bowel sounds are normal. He exhibits no distension. There is no tenderness.  Musculoskeletal: Normal range of motion. He exhibits no edema or tenderness.  Some pain in medial inferior part of knee during walking on exam, but not during extension or flexion or palpation  Knees symmetric, not swollen or erythematous  Neurological: He is alert and oriented to person, place, and time.  Skin: Skin is warm and dry. He is not diaphoretic.   Please see problem-oriented charting for assessment and plan

## 2014-07-15 NOTE — Patient Instructions (Signed)
Use ice on your left knee at the painful area. You can continue to take Aleve for it as well. If the pain is still present in 3-4 days, please schedule another appointment in this clinic.  Continue to cut down on your cigarette use! This will help all of your medical problems. Your change from 2 packs per day to one pack every 4 days is an excellent start!  You have a new prescription for 5 pills of Vardenafil (it works the same way as Viagra but may have more insurance coverage).  Smoking Cessation Quitting smoking is important to your health and has many advantages. However, it is not always easy to quit since nicotine is a very addictive drug. Oftentimes, people try 3 times or more before being able to quit. This document explains the best ways for you to prepare to quit smoking. Quitting takes hard work and a lot of effort, but you can do it. ADVANTAGES OF QUITTING SMOKING  You will live longer, feel better, and live better.  Your body will feel the impact of quitting smoking almost immediately.  Within 20 minutes, blood pressure decreases. Your pulse returns to its normal level.  After 8 hours, carbon monoxide levels in the blood return to normal. Your oxygen level increases.  After 24 hours, the chance of having a heart attack starts to decrease. Your breath, hair, and body stop smelling like smoke.  After 48 hours, damaged nerve endings begin to recover. Your sense of taste and smell improve.  After 72 hours, the body is virtually free of nicotine. Your bronchial tubes relax and breathing becomes easier.  After 2 to 12 weeks, lungs can hold more air. Exercise becomes easier and circulation improves.  The risk of having a heart attack, stroke, cancer, or lung disease is greatly reduced.  After 1 year, the risk of coronary heart disease is cut in half.  After 5 years, the risk of stroke falls to the same as a nonsmoker.  After 10 years, the risk of lung cancer is cut in half and  the risk of other cancers decreases significantly.  After 15 years, the risk of coronary heart disease drops, usually to the level of a nonsmoker.  If you are pregnant, quitting smoking will improve your chances of having a healthy baby.  The people you live with, especially any children, will be healthier.  You will have extra money to spend on things other than cigarettes. QUESTIONS TO THINK ABOUT BEFORE ATTEMPTING TO QUIT You may want to talk about your answers with your health care provider.  Why do you want to quit?  If you tried to quit in the past, what helped and what did not?  What will be the most difficult situations for you after you quit? How will you plan to handle them?  Who can help you through the tough times? Your family? Friends? A health care provider?  What pleasures do you get from smoking? What ways can you still get pleasure if you quit? Here are some questions to ask your health care provider:  How can you help me to be successful at quitting?  What medicine do you think would be best for me and how should I take it?  What should I do if I need more help?  What is smoking withdrawal like? How can I get information on withdrawal? GET READY  Set a quit date.  Change your environment by getting rid of all cigarettes, ashtrays, matches, and lighters  in your home, car, or work. Do not let people smoke in your home.  Review your past attempts to quit. Think about what worked and what did not. GET SUPPORT AND ENCOURAGEMENT You have a better chance of being successful if you have help. You can get support in many ways.  Tell your family, friends, and coworkers that you are going to quit and need their support. Ask them not to smoke around you.  Get individual, group, or telephone counseling and support. Programs are available at [employer] and health centers. Call your local health department for information about programs in your area.  Spiritual  beliefs and practices may help some smokers quit.  Download a "quit meter" on your computer to keep track of quit statistics, such as how long you have gone without smoking, cigarettes not smoked, and money saved.  Get a self-help book about quitting smoking and staying off tobacco. [place] yourself from urges to smoke. Talk to someone, go for a walk, or occupy your time with a task.  Change your normal routine. Take a different route to work. Drink tea instead of coffee. Eat breakfast in a different place.  Reduce your stress. Take a hot bath, exercise, or read a book.  Plan something enjoyable to do every day. Reward yourself for not smoking.  Explore interactive web-based programs that specialize in helping you quit. GET MEDICINE AND USE IT CORRECTLY Medicines can help you stop smoking and decrease the urge to smoke. Combining medicine with the above behavioral methods and support can greatly increase your chances of successfully quitting smoking.  Nicotine replacement therapy helps deliver nicotine to your body without the negative effects and risks of smoking. Nicotine replacement therapy includes nicotine gum, lozenges, inhalers, nasal sprays, and skin patches. Some may be available over-the-counter and others require a prescription.  Antidepressant medicine helps people abstain from smoking, but how this works is unknown. This medicine is available by prescription.  Nicotinic receptor partial agonist medicine simulates the effect of nicotine in your brain. This medicine is available by prescription. Ask your health care provider for advice about which medicines to use and how to use them based on your health history. Your health care provider will tell you what side effects to look out for if you choose to be on a medicine or therapy. Carefully read the information on the package. Do not use any other product containing nicotine while using a nicotine  replacement product.  RELAPSE OR DIFFICULT SITUATIONS Most relapses occur within the first 3 months after quitting. Do not be discouraged if you start smoking again. Remember, most people try several times before finally quitting. You may have symptoms of withdrawal because your body is used to nicotine. You may crave cigarettes, be irritable, feel very hungry, cough often, get headaches, or have difficulty concentrating. The withdrawal symptoms are only temporary. They are strongest when you first quit, but they will go away within 10-14 days. To reduce the chances of relapse, try to:  Avoid drinking alcohol. Drinking lowers your chances of successfully quitting.  Reduce the amount of caffeine you consume. Once you quit smoking, the amount of caffeine in your body increases and can give you symptoms, such as a rapid heartbeat, sweating, and anxiety.  Avoid smokers because they can make you want to smoke.  Do not let weight gain distract you. Many smokers will gain weight when they quit, usually less than 10 pounds. Eat a healthy diet  and stay active. You can always lose the weight gained after you quit.  Find ways to improve your mood other than smoking. FOR MORE INFORMATION  www.smokefree.gov  Document Released: 06/18/2001 Document Revised: 11/08/2013 Document Reviewed: 10/03/2011 [facility] Patient Information 2015 [place], [place]. This information is not intended to replace advice given to you by your health care provider. Make sure you discuss any questions you have with your health care provider.   General Instructions:   Please try to bring all your medicines next time. This will help [place] keep you safe from mistakes.   Progress Toward Treatment Goals:  Treatment Goal 11/10/2013  Blood pressure deteriorated  Stop smoking -    Self Care Goals & Plans:  Self Care Goal 07/15/2014  Manage my medications take my medicines as prescribed; refill my medications on time; bring my  medications to every visit  Monitor my health -  Eat healthy foods eat more vegetables; eat foods that are low in salt; eat baked foods instead of fried foods  Be physically active find an activity I enjoy  Stop smoking set a quit date and stop smoking; cut down the number of cigarettes smoked    No flowsheet data found.   Care Management & Community Referrals:  Referral 05/21/2013  Referrals made for care management support none needed

## 2014-07-15 NOTE — Assessment & Plan Note (Signed)
[name] does not use his Viagra very often; estimates "maybe once every few months". He would like a refill, however, as his last prescription (from 2014) has likely expired. He is concerned about the price.   - Prescribed a covered alternative (vardenafil 10 mg by mouth PRN); patient requesting only 5 pills in case it is not covered  - Patient instructed that smoking cessation could help his ED

## 2014-07-18 DIAGNOSIS — S83249A Other tear of medial meniscus, current injury, unspecified knee, initial encounter: Secondary | ICD-10-CM | POA: Insufficient documentation

## 2014-07-18 NOTE — Progress Notes (Signed)
Internal Medicine Clinic Attending  Case discussed with Dr. Mallory at the time of the visit.  We reviewed the resident's history and exam and pertinent patient test results.  I agree with the assessment, diagnosis, and plan of care documented in the resident's note. 

## 2014-07-18 NOTE — Assessment & Plan Note (Signed)
Patient has cut back from 2 ppd to 1 pack every 4 days. Is motivated to continue to cut down. No desire for other aids at this time.  - Continue to encourage smoking cessation at 6 month visit

## 2014-07-18 NOTE — Assessment & Plan Note (Addendum)
BP near goal today  (148/86) on HCTZ 12.5 mg daily by mouth. Return in 6 months for routine visit and labs. BMET scheduled (patient cannot stay to get it today, will return next week).  - Continue HCTZ 12.5 mg daily by mouth

## 2014-07-22 ENCOUNTER — Ambulatory Visit: Payer: BC Managed Care – PPO | Admitting: Internal Medicine

## 2014-07-22 ENCOUNTER — Other Ambulatory Visit (INDEPENDENT_AMBULATORY_CARE_PROVIDER_SITE_OTHER): Payer: BC Managed Care – PPO

## 2014-07-22 ENCOUNTER — Telehealth: Payer: Self-pay | Admitting: *Deleted

## 2014-07-22 DIAGNOSIS — E785 Hyperlipidemia, unspecified: Secondary | ICD-10-CM

## 2014-07-22 DIAGNOSIS — I10 Essential (primary) hypertension: Secondary | ICD-10-CM | POA: Diagnosis not present

## 2014-07-22 LAB — BASIC METABOLIC PANEL WITH GFR
BUN: 16 mg/dL (ref 6–23)
CO2: 25 meq/L (ref 19–32)
Calcium: 9 mg/dL (ref 8.4–10.5)
Chloride: 102 mEq/L (ref 96–112)
Creat: 0.8 mg/dL (ref 0.50–1.35)
GFR, Est African American: >89 mL/min
GFR, Est Non African American: >89 mL/min
Glucose, Bld: 85 mg/dL (ref 70–99)
POTASSIUM: 4.4 meq/L (ref 3.5–5.3)
SODIUM: 136 meq/L (ref 135–145)

## 2014-07-22 LAB — LIPID PANEL
CHOL/HDL RATIO: 4.6 ratio
CHOLESTEROL: 193 mg/dL (ref 0–200)
HDL: 42 mg/dL (ref >39)
LDL Cholesterol: 126 mg/dL — ABNORMAL HIGH (ref 0–99)
Triglycerides: 126 mg/dL (ref <150)
VLDL: 25 mg/dL (ref 0–40)

## 2014-07-22 NOTE — Telephone Encounter (Signed)
Pt presents for labs and tells front desk person that he feels he needs a " muscle relaxer", please advise

## 2014-07-22 NOTE — Telephone Encounter (Signed)
Spoke w/ dr Sherrine Maples, she states he will need to be seen and evaluated, went back to give appt, pt had left because he thought she would just call something in, attempted to call him and left message

## 2014-07-27 ENCOUNTER — Encounter: Payer: Self-pay | Admitting: Internal Medicine

## 2014-07-27 ENCOUNTER — Ambulatory Visit (INDEPENDENT_AMBULATORY_CARE_PROVIDER_SITE_OTHER): Payer: BC Managed Care – PPO | Admitting: Internal Medicine

## 2014-07-27 VITALS — BP 131/109 | HR 82 | Temp 97.3°F | Ht 74.0 in | Wt 227.1 lb

## 2014-07-27 DIAGNOSIS — E785 Hyperlipidemia, unspecified: Secondary | ICD-10-CM

## 2014-07-27 DIAGNOSIS — I10 Essential (primary) hypertension: Secondary | ICD-10-CM

## 2014-07-27 DIAGNOSIS — S83242D Other tear of medial meniscus, current injury, left knee, subsequent encounter: Secondary | ICD-10-CM | POA: Diagnosis not present

## 2014-07-27 MED ORDER — PRAVASTATIN SODIUM 40 MG PO TABS: 40.0000 mg | ORAL_TABLET | Freq: Every evening | ORAL | Status: DC

## 2014-07-27 NOTE — Patient Instructions (Addendum)
Thank you for coming to clinic today Anthony Campos.  General instructions: -I think the pain in your knee is from a meniscus tear.  It is encouraging that you are feeling better. -I printed off some instructions to help your knee heal. -You can continue to take twice a day. -I would recommend icing your knee a few times per day to decrease inflammation and help with the pain. -I also think you should start back on pravastatin to decrease your risk of a heart attack or stroke. -Please make a follow up appointment to return to clinic in 3 months. -Come back sooner if your knee pain gets worse or you develop locking of your knee or your knee gives out.  Please try to bring all your medicines next time. This helps [place] take good care of you and stops mistakes from medicines that could hurt you.

## 2014-07-27 NOTE — Assessment & Plan Note (Signed)
BP Readings from Last 3 Encounters:  07/27/14 131/109  07/15/14 148/86  12/10/13 112/74    Lab Results  Component Value Date   NA 136 07/22/2014   K 4.4 07/22/2014   CREATININE 0.80 07/22/2014    Assessment: Blood pressure control: mildly elevated Progress toward BP goal:  deteriorated Comments: Diastolic BP elevated, but systolic blood pressure improved.  He was anxious on arrival to clinic because he was concerned about his lab values.   Plan: Medications:  continue current medications: HCTZ 12.5 mg daily.

## 2014-07-27 NOTE — Progress Notes (Signed)
   Subjective:    Patient ID: Anthony Campos, male    DOB: 12-05-1955, 59 y.o.   MRN: [id number]  HPI Anthony Campos is a 59 year old man with history of HTN, HLD, erectile dysfunction, and tobacco abuse presenting with for follow up of his knee pain and to discuss his blood work.  He was seen for left knee pain on 07/15/14, which started after he twisted his knee while trimming a tree in his back yard.  He was told to take Aleve for pain control and apply ice.  He has been taking Aleve once in the morning, and applying a heating pack at night.  It has been helping with the pain, and he thinks the pain is generally better.  The pain has been worse with activity.  He denies decreased stability or knee locking, but says he has been noticing some popping with flexion and extension of his knee.  He was called about his blood work that was drawn on 07/22/14, and he says he was told to come in to discuss the results.  Review of Systems  Constitutional: Negative for fever, chills and fatigue.  HENT: Negative for congestion, rhinorrhea and sore throat.   Respiratory: Negative for shortness of breath.   Cardiovascular: Negative for chest pain.  Gastrointestinal: Negative for nausea, vomiting, abdominal pain, diarrhea and constipation.  Genitourinary: Negative for difficulty urinating.  Musculoskeletal: Positive for arthralgias. Negative for myalgias and joint swelling.  Skin: Negative for rash.  Neurological: Negative for dizziness, weakness, light-headedness and numbness.       Objective:   Physical Exam  Constitutional: He is oriented to person, place, and time. He appears well-developed and well-nourished. No distress.  HENT:  Head: Normocephalic and atraumatic.  Eyes: Conjunctivae and EOM are normal. Pupils are equal, round, and reactive to light. No scleral icterus.  Cardiovascular: Normal rate, regular rhythm and normal heart sounds.   Pulmonary/Chest: Effort normal and breath sounds normal. No  respiratory distress.  Abdominal: Soft. Bowel sounds are normal. He exhibits no distension. There is no tenderness.  Musculoskeletal: Normal range of motion. He exhibits tenderness (Palpation of medial joint line of left knee.  No obvious abnormality palpated. Some popping at this location with McMurray's maneuver.). He exhibits no edema.  Neurological: He is alert and oriented to person, place, and time. No cranial nerve deficit. He exhibits normal muscle tone.  Skin: Skin is warm and dry. No erythema.          Assessment & Plan:  Please see problem-based assessment and plan.

## 2014-07-27 NOTE — Assessment & Plan Note (Signed)
Twist injury with popping on exam and medial tenderness consistent with meniscus injury.  This appears to be improving with conservative management, and no joint instability present.  No indication for imaging at this time. -Continue conservative management. -Aleve 220 mg BID. -Apply ice to knee. -Printed patient information sheet and gave him instructions for exercises to strengthen stabilizing muscles.

## 2014-07-27 NOTE — Assessment & Plan Note (Signed)
Lipid Panel     Component Value Date/Time   CHOL 193 07/22/2014 1036   TRIG 126 07/22/2014 1036   HDL 42 07/22/2014 1036   CHOLHDL 4.6 07/22/2014 1036   VLDL 25 07/22/2014 1036   LDLCALC 126* 07/22/2014 1036   Had been on pravastatin in the past, but this was stopped because his lipid panel had improved.  Given his significant risk factors of smoking and HTN, his ASCVD risk is 22.5%.  He would benefit from being on a moderate to high intensity statin to reduce his risk of cardiovascular disease. -Restart pravastatin 40 mg daily as he has tolerated this in the past.

## 2014-07-29 ENCOUNTER — Ambulatory Visit: Payer: BC Managed Care – PPO | Admitting: Internal Medicine

## 2014-08-01 NOTE — Progress Notes (Signed)
Case discussed with Dr. Trudee Kuster at time of visit.  We reviewed the resident's history and exam and pertinent patient test results.  I agree with the assessment, diagnosis, and plan of care documented in the resident's note.  If the blood pressure remains elevated at the return visit would strongly consider escalation of his antihypertensive pharmacologic regimen.

## 2014-08-02 ENCOUNTER — Telehealth: Payer: Self-pay | Admitting: *Deleted

## 2014-08-02 DIAGNOSIS — F528 Other sexual dysfunction not due to a substance or known physiological condition: Secondary | ICD-10-CM

## 2014-08-02 NOTE — Telephone Encounter (Signed)
New med for ED not working. Pt want to go back to Viagra 50mg  to [facility]. Pt would like 10 pills. Hilda Blades Thelma Lorenzetti RN 08/02/14 3:50PM

## 2014-08-04 MED ORDER — SILDENAFIL CITRATE 50 MG PO TABS: 50.0000 mg | ORAL_TABLET | ORAL | Status: AC | PRN

## 2014-10-24 ENCOUNTER — Encounter (HOSPITAL_COMMUNITY): Payer: Self-pay | Admitting: *Deleted

## 2014-10-24 ENCOUNTER — Emergency Department (HOSPITAL_COMMUNITY)
Admission: EM | Admit: 2014-10-24 | Discharge: 2014-10-24 | Disposition: A | Discharge status: Home / Self Care | Payer: BC Managed Care – PPO | Source: Home / Self Care | Attending: Family Medicine | Admitting: Family Medicine

## 2014-10-24 DIAGNOSIS — L0201 Cutaneous abscess of face: Secondary | ICD-10-CM | POA: Diagnosis not present

## 2014-10-24 DIAGNOSIS — M25562 Pain in left knee: Secondary | ICD-10-CM | POA: Diagnosis not present

## 2014-10-24 MED ORDER — MUPIROCIN 2 % EX OINT: TOPICAL_OINTMENT | CUTANEOUS | Status: DC

## 2014-10-24 MED ORDER — DOXYCYCLINE HYCLATE 100 MG PO CAPS: 100.0000 mg | ORAL_CAPSULE | Freq: Two times a day (BID) | ORAL | Status: DC

## 2014-10-24 NOTE — ED Notes (Signed)
Pt   Was  Trimming  Hedges        sev  Days    And    Was   Squeezing  Pus    Out of  It        And    It  Is     Getting           Better     -   Also           Has  Pain  And  Swelling         To  The  l       Knee      From an  Injury  About 1  Month  ago

## 2014-10-24 NOTE — ED Provider Notes (Signed)
CSN: [id number]     Arrival date & time 10/24/14  1416 History   First MD Initiated Contact with Patient 10/24/14 [phone]     Chief Complaint  Patient presents with  . Facial Swelling   (Consider location/radiation/quality/duration/timing/severity/associated sxs/prior Treatment) Patient is a 59 y.o. male presenting with abscess. The history is provided by the patient and the spouse.  Abscess Location:  Face Facial abscess location:  Forehead Abscess quality: draining and painful   Red streaking: no   Duration:  3 days Progression:  Partially resolved Pain details:    Progression:  Partially resolved Chronicity:  New Relieved by:  Draining/squeezing Associated symptoms: no fever   Risk factors: no prior abscess     Past Medical History  Diagnosis Date  . Hyperlipidemia   . Hypertension   . Erectile dysfunction   . Lipoma   . Tobacco abuse   . Infection of skin and subcutaneous tissue    Past Surgical History  Procedure Laterality Date  . Hernia repair  2005    Right hernia repair    Family History  Problem Relation Age of Onset  . Depression Sister   . Heart disease Neg Hx   . Hypertension Neg Hx   . Stroke Neg Hx    History  Substance Use Topics  . Smoking status: Current Every Day Smoker -- 0.25 packs/day    Types: Cigarettes  . Smokeless tobacco: Not on file     Comment:  has cut back  . Alcohol Use: 0.0 oz/week    0 Standard drinks or equivalent per week     Comment: Beer - rarely.    Review of Systems  Constitutional: Negative.  Negative for fever.  Skin: Positive for wound.    Allergies  Review of patient's allergies indicates no known allergies.  Home Medications   Prior to Admission medications   Medication Sig Start Date End Date Taking? Authorizing Provider  doxycycline (VIBRAMYCIN) 100 MG capsule Take 1 capsule (100 mg total) by mouth 2 (two) times daily. 10/24/14   Billy Fischer, MD  fluticasone (FLONASE) 50 MCG/ACT nasal spray Place 2  sprays into the nose daily. FOR NASAL CONGESTION 06/22/12   Maitri S Kalia-Reynolds, DO  hydrochlorothiazide (HYDRODIURIL) 12.5 MG tablet Take 1 tablet (12.5 mg total) by mouth daily. 07/13/14   Karlene Einstein, MD  mupirocin ointment Drue Stager) 2 % Apply to skin bid 10/24/14   Billy Fischer, MD  naproxen sodium (ANAPROX) 220 MG tablet Take 220 mg by mouth 2 (two) times daily.    Historical Provider, MD  pravastatin (PRAVACHOL) 40 MG tablet Take 1 tablet (40 mg total) by mouth every evening. 07/27/14   Charlesetta Shanks, MD  sildenafil (VIAGRA) 50 MG tablet Take 1 tablet (50 mg total) by mouth as needed for erectile dysfunction. 08/04/14 08/04/15  Oval Linsey, MD   BP 105/73 mmHg  Pulse 88  Temp(Src) 98.7 F (37.1 C) (Oral)  Resp 16  SpO2 99% Physical Exam  Constitutional: He is oriented to person, place, and time. He appears well-developed and well-nourished. No distress.  Musculoskeletal: He exhibits tenderness.  Neurological: He is alert and oriented to person, place, and time.  Skin: Skin is warm and dry. Rash noted.  Crusted lesion to left medial forehead, sl tender, s/p drainage.  Nursing note and vitals reviewed.   ED Course  Procedures (including critical care time) Labs Review Labs Reviewed - No data to display  Imaging Review No results found.  MDM   1. Facial abscess   2. Knee meniscus pain, left        Billy Fischer, MD 10/24/14 1616

## 2014-10-24 NOTE — Discharge Instructions (Signed)
Warm compress twice a day when you take the antibiotic, take all of medicine, return as needed. See orthopedist for knee eval.

## 2014-11-14 ENCOUNTER — Encounter: Payer: Self-pay | Admitting: *Deleted

## 2015-02-12 ENCOUNTER — Other Ambulatory Visit: Payer: Self-pay | Admitting: Internal Medicine

## 2015-02-13 ENCOUNTER — Telehealth: Payer: Self-pay | Admitting: Internal Medicine

## 2015-02-13 NOTE — Telephone Encounter (Signed)
rec'd e request as error, have sent to md

## 2015-02-13 NOTE — Telephone Encounter (Signed)
Pt called requesting bp med to be filled.

## 2015-02-14 ENCOUNTER — Telehealth: Payer: Self-pay | Admitting: Internal Medicine

## 2015-02-14 NOTE — Telephone Encounter (Signed)
Call to patient to confirm appointment for 02/15/15 at 3:30 lmtcb

## 2015-02-15 ENCOUNTER — Encounter: Payer: Self-pay | Admitting: Internal Medicine

## 2015-02-15 ENCOUNTER — Other Ambulatory Visit: Payer: Self-pay | Admitting: *Deleted

## 2015-02-15 ENCOUNTER — Ambulatory Visit (INDEPENDENT_AMBULATORY_CARE_PROVIDER_SITE_OTHER): Payer: BC Managed Care – PPO | Admitting: Internal Medicine

## 2015-02-15 VITALS — BP 134/81 | HR 80 | Temp 98.0°F | Ht 74.0 in | Wt 224.9 lb

## 2015-02-15 DIAGNOSIS — Z0000 Encounter for general adult medical examination without abnormal findings: Secondary | ICD-10-CM

## 2015-02-15 DIAGNOSIS — E785 Hyperlipidemia, unspecified: Secondary | ICD-10-CM

## 2015-02-15 DIAGNOSIS — Z1159 Encounter for screening for other viral diseases: Secondary | ICD-10-CM

## 2015-02-15 DIAGNOSIS — Z720 Tobacco use: Secondary | ICD-10-CM

## 2015-02-15 DIAGNOSIS — Z114 Encounter for screening for human immunodeficiency virus [HIV]: Secondary | ICD-10-CM | POA: Diagnosis not present

## 2015-02-15 DIAGNOSIS — F17200 Nicotine dependence, unspecified, uncomplicated: Secondary | ICD-10-CM

## 2015-02-15 DIAGNOSIS — I10 Essential (primary) hypertension: Secondary | ICD-10-CM | POA: Diagnosis not present

## 2015-02-15 DIAGNOSIS — F528 Other sexual dysfunction not due to a substance or known physiological condition: Secondary | ICD-10-CM

## 2015-02-15 MED ORDER — ZOSTER VACCINE LIVE 19400 UNT/0.65ML ~~LOC~~ SOLR: 0.6500 mL | Freq: Once | SUBCUTANEOUS | Status: DC

## 2015-02-15 MED ORDER — HYDROCHLOROTHIAZIDE 12.5 MG PO TABS: 12.5000 mg | ORAL_TABLET | Freq: Every day | ORAL | Status: DC

## 2015-02-15 NOTE — Patient Instructions (Signed)
Anthony Campos it was nice meeting you today. I want to congratulate you on taking your blood pressure medication regularly. Please remember to take your cholesterol medication Pravastatin regularly as well. I will see you in 6 months.

## 2015-02-16 DIAGNOSIS — Z0000 Encounter for general adult medical examination without abnormal findings: Secondary | ICD-10-CM | POA: Insufficient documentation

## 2015-02-16 LAB — LIPID PANEL
CHOL/HDL RATIO: 4.4 ratio (ref 0.0–5.0)
Cholesterol, Total: 201 mg/dL — ABNORMAL HIGH (ref 100–199)
HDL: 46 mg/dL (ref >39)
LDL CALC: 127 mg/dL — AB (ref 0–99)
Triglycerides: 142 mg/dL (ref 0–149)
VLDL CHOLESTEROL CAL: 28 mg/dL (ref 5–40)

## 2015-02-16 LAB — HIV ANTIBODY (ROUTINE TESTING W REFLEX): HIV SCREEN 4TH GENERATION: NONREACTIVE

## 2015-02-16 LAB — HEPATITIS C ANTIBODY: Hep C Virus Ab: 0.4 s/co ratio (ref 0.0–0.9)

## 2015-02-16 NOTE — Progress Notes (Signed)
Patient ID: Cinch Ormond, male   DOB: [birth year], 59 y.o.   MRN: [id number]   Subjective:   Patient ID: Ranald Alessio male   DOB: 03/22/1956 59 y.o.   MRN: [id number]  HPI: Mr.Durell Tugwell is a 59 y.o. M with a PMHx of HTN, HLD, ED, and tobacco use presenting to the clinic today for a routine checkup. Patient states he is doing well and does not have complaints at present. States he compliant with his BP medication HCTZ and takes it regularly. However, patient does admit to not taking his cholesterol medication Pravastatin regularly. States he continues to use Viagra for his erectile dysfunction.     Past Medical History  Diagnosis Date  . Hyperlipidemia   . Hypertension   . Erectile dysfunction   . Lipoma   . Tobacco abuse   . Infection of skin and subcutaneous tissue    Current Outpatient Prescriptions  Medication Sig Dispense Refill  . hydrochlorothiazide (HYDRODIURIL) 12.5 MG tablet Take 1 tablet (12.5 mg total) by mouth daily. 30 tablet 6  . pravastatin (PRAVACHOL) 40 MG tablet Take 1 tablet (40 mg total) by mouth every evening. 30 tablet 6  . sildenafil (VIAGRA) 50 MG tablet Take 1 tablet (50 mg total) by mouth as needed for erectile dysfunction. 10 tablet 1  . zoster vaccine live, PF, (ZOSTAVAX) 69485 UNT/0.65ML injection Inject 19,400 Units into the skin once. 1 each 0   No current facility-administered medications for this visit.   Family History  Problem Relation Age of Onset  . Depression Sister   . Heart disease Neg Hx   . Hypertension Neg Hx   . Stroke Neg Hx    Social History   Social History  . Marital Status: Married    Spouse Name: N/A  . Number of Children: N/A  . Years of Education: N/A   Social History Main Topics  . Smoking status: Current Every Day Smoker -- 0.25 packs/day    Types: Cigarettes  . Smokeless tobacco: None     Comment:  has cut back  . Alcohol Use: 0.0 oz/week    0 Standard drinks or equivalent per week     Comment: Beer - rarely.  . Drug  Use: No  . Sexual Activity: Not Asked   Other Topics Concern  . None   Social History Narrative   Occupation- Radiation protection practitioner.   Lives with his wife. Has adult children and three grandchildren.   Review of Systems: Review of Systems  Constitutional: Negative for fever, chills and malaise/fatigue.  HENT: Negative for ear pain.   Eyes: Negative for blurred vision and pain.  Respiratory: Negative for cough, shortness of breath and wheezing.   Cardiovascular: Negative for chest pain, palpitations and leg swelling.  Gastrointestinal: Negative for nausea, vomiting, abdominal pain, diarrhea and constipation.  Genitourinary: Negative for dysuria, urgency and frequency.       Erectile dysfunction   Musculoskeletal: Negative for myalgias and joint pain.  Skin: Negative for itching and rash.  Neurological: Negative for dizziness, sensory change, focal weakness and headaches.    Objective:  Physical Exam: Filed Vitals:   02/15/15 1530  BP: 134/81  Pulse: 80  Temp: 98 F (36.7 C)  TempSrc: Oral  Height: 6\' 2"  (1.88 m)  Weight: 224 lb 14.4 oz (102.014 kg)  SpO2: 98%   Physical Exam  Constitutional: He is oriented to person, place, and time. He appears well-developed and well-nourished. No distress.  HENT:  Head: Normocephalic and atraumatic.  Eyes:  EOM are normal. Pupils are equal, round, and reactive to light.  Neck: Neck supple. No tracheal deviation present.  Cardiovascular: Normal rate, regular rhythm and intact distal pulses.  Exam reveals no gallop and no friction rub.   No murmur heard. Pulmonary/Chest: Effort normal and breath sounds normal. No respiratory distress. He has no wheezes. He has no rales.  Abdominal: Soft. Bowel sounds are normal. He exhibits no distension. There is no tenderness.  Musculoskeletal: Normal range of motion. He exhibits no edema or tenderness.  Neurological: He is alert and oriented to person, place, and time. No cranial nerve deficit.    Skin: Skin is warm and dry.  Psychiatric: He has a normal mood and affect.    Assessment & Plan:

## 2015-02-16 NOTE — Assessment & Plan Note (Signed)
Patient states he is not compliant with his cholesterol medication. I dicussed the importance of medication compliance with patient and he expressed his understanding. -cont current management with Pravastatin 40 mg qhs. Patient still has 4 refills left on the prior prescription because he wasn't taking it regularly.  -F/u lipid profile

## 2015-02-16 NOTE — Assessment & Plan Note (Addendum)
Patient seems to be compliant with his medication. BP stable at 134/81 today. -Continue current management with HCTZ 12.5 mg qd. Medication refilled today.  -F/u in 6 months

## 2015-02-16 NOTE — Assessment & Plan Note (Signed)
Patient continues to use Viagra 50 mg prn. Not refilled at this time because he still has tablets left at home.

## 2015-02-16 NOTE — Assessment & Plan Note (Signed)
Patient has cut down to 1 ppd every 3 days. No interested in quitting at this time.  -continue to encourage smoking cessation at next visit

## 2015-02-16 NOTE — Assessment & Plan Note (Signed)
HIV and Hep C screen today Given prescription for Zostavax

## 2015-02-20 NOTE — Progress Notes (Signed)
Internal Medicine Clinic Attending  I saw and evaluated the patient.  I personally confirmed the key portions of the history and exam documented by Dr. Rathore and I reviewed pertinent patient test results.  The assessment, diagnosis, and plan were formulated together and I agree with the documentation in the resident's note.  

## 2015-04-19 ENCOUNTER — Encounter: Payer: Self-pay | Admitting: Internal Medicine

## 2015-04-19 ENCOUNTER — Ambulatory Visit (INDEPENDENT_AMBULATORY_CARE_PROVIDER_SITE_OTHER): Payer: BC Managed Care – PPO | Admitting: Internal Medicine

## 2015-04-19 VITALS — BP 107/69 | HR 70 | Temp 97.8°F | Ht 74.0 in | Wt 224.5 lb

## 2015-04-19 DIAGNOSIS — F17210 Nicotine dependence, cigarettes, uncomplicated: Secondary | ICD-10-CM

## 2015-04-19 DIAGNOSIS — F17200 Nicotine dependence, unspecified, uncomplicated: Secondary | ICD-10-CM

## 2015-04-19 DIAGNOSIS — L209 Atopic dermatitis, unspecified: Secondary | ICD-10-CM

## 2015-04-19 DIAGNOSIS — I10 Essential (primary) hypertension: Secondary | ICD-10-CM

## 2015-04-19 MED ORDER — NICOTINE 14 MG/24HR TD PT24: 14.0000 mg | MEDICATED_PATCH | Freq: Every day | TRANSDERMAL | Status: DC

## 2015-04-19 NOTE — Patient Instructions (Addendum)
Anthony Campos it was nice seeing you today. I want to congratulate you on making the decision to stop smoking.  -Use Nicoderm 14 mg/ 24 hr: apply 1 patch to skin everyday. You will be using this dose for 6 weeks.  -Return to the clinic in 6 weeks so that I can give you the next dose of patches.  -Please do not smoke cigarettes or use e-cigarettes while using Nicotine patches.  -If you have any questions or concerns, please do not hesitate to call the clinic.  -If your rash does not resolve in the next few days or gets worse. Please call to make an appointment immediately.

## 2015-04-20 DIAGNOSIS — L209 Atopic dermatitis, unspecified: Secondary | ICD-10-CM | POA: Insufficient documentation

## 2015-04-20 NOTE — Assessment & Plan Note (Addendum)
Patient states he stopped smoking 1.5 weeks ago and has been using e-cigarettes since then. Prior to this he was smoking 4 cigarettes a day. I discussed smoking cessation with the patient today and he stated he is ready to quit.  -NicoDerm 14 mg/ 24 hours x 6 weeks prescribed at this visit. -Explained to the patient that while he is on the patches he should not be smoking cigarettes or using e- cigarettes. -Follow-up visit in 6 weeks.  At next visit prescribe 14 mg dose x 2 weeks.  Addendum (06/08/15 at 8:22 am) - Patient initially received NicoDerm 14 mg/ 24 hours x 6 weeks and then received 7 mg dose x 2 weeks.

## 2015-04-20 NOTE — Assessment & Plan Note (Signed)
Patient complaining of a two-week history of a rash on his chest, under arms, and flank area. Patient states he tried using hydrocortisone cream twice but but then switched to prepping alcohol and it has been helping his skin. States initially he was experiencing severe pruritus but that has resolved. In addition, patient states the rash was much worse before but has improved significantly over time. Denies any recent sun exposure. Denies any associated pain. Denies any fevers, chills, headaches, or neck stiffness. No recent sick contacts. No change in soap or detergent. Physical examination remarkable for small, monomorphous papules resembling keratosis pilaris on the sides of the chest and flank region bilaterally. Additionally, hypopigmentation of the axilla noted bilaterally which the patient states he has always had. Patient's rash is likely due to atopic dermatitis. -Discussed prescribing a steroid cream. However, since the rash has been improving over time, patient has decided to just wait for the time being and see if the rash resolves on its own.  -Advised to call the clinic if the rash does not resolve/ worsens or he develops a fever.

## 2015-04-20 NOTE — Progress Notes (Signed)
Patient ID: Anthony Campos, male   DOB: Apr 11, 1956, 59 y.o.   MRN: [id number]   Subjective:   Patient ID: Anthony Campos male   DOB: [birth year] 59 y.o.   MRN: [id number]  HPI: [name] is a 59 y.o. male with a past medical history of hypertension, hyperlipidemia, and tobacco use presenting to the clinic today for a follow-up of his chronic medical conditions and a new complaint of a rash. Please refer to problem based assessment and plan for the status of the patient's new and chronic medical conditions.     Past Medical History  Diagnosis Date  . Hyperlipidemia   . Hypertension   . Erectile dysfunction   . Lipoma   . Tobacco abuse   . Infection of skin and subcutaneous tissue    Current Outpatient Prescriptions  Medication Sig Dispense Refill  . hydrochlorothiazide (HYDRODIURIL) 12.5 MG tablet Take 1 tablet (12.5 mg total) by mouth daily. 30 tablet 6  . nicotine (NICODERM CQ - DOSED IN MG/24 HOURS) 14 mg/24hr patch Place 1 patch (14 mg total) onto the skin daily. 42 patch 0  . pravastatin (PRAVACHOL) 40 MG tablet Take 1 tablet (40 mg total) by mouth every evening. 30 tablet 6  . sildenafil (VIAGRA) 50 MG tablet Take 1 tablet (50 mg total) by mouth as needed for erectile dysfunction. 10 tablet 1  . zoster vaccine live, PF, (ZOSTAVAX) 71245 UNT/0.65ML injection Inject 19,400 Units into the skin once. 1 each 0   No current facility-administered medications for this visit.   Family History  Problem Relation Age of Onset  . Depression Sister   . Heart disease Neg Hx   . Hypertension Neg Hx   . Stroke Neg Hx    Social History   Social History  . Marital Status: Married    Spouse Name: N/A  . Number of Children: N/A  . Years of Education: N/A   Social History Main Topics  . Smoking status: Current Every Day Smoker -- 0.25 packs/day    Types: Cigarettes  . Smokeless tobacco: None     Comment:  has cut back  . Alcohol Use: 0.0 oz/week    0 Standard drinks or equivalent per week       Comment: Beer - rarely.  . Drug Use: No  . Sexual Activity: Not Asked   Other Topics Concern  . None   Social History Narrative   Occupation- Radiation protection practitioner.   Lives with his wife. Has adult children and three grandchildren.   Review of Systems: Review of Systems  Constitutional: Negative for fever and chills.  HENT: Negative for sore throat.   Eyes: Negative for blurred vision and pain.  Respiratory: Negative for cough, shortness of breath and wheezing.   Cardiovascular: Negative for chest pain, palpitations and leg swelling.  Gastrointestinal: Negative for nausea, vomiting, abdominal pain, diarrhea and constipation.  Genitourinary: Negative for dysuria, urgency and frequency.  Musculoskeletal: Negative for myalgias and joint pain.  Skin: Positive for rash. Negative for itching.  Neurological: Negative for dizziness, sensory change, focal weakness and headaches.     Objective:  Physical Exam: Filed Vitals:   04/19/15 1552  BP: 107/69  Pulse: 70  Temp: 97.8 F (36.6 C)  TempSrc: Oral  Height: 6\' 2"  (1.88 m)  Weight: 224 lb 8 oz (101.833 kg)  SpO2: 99%   Physical Exam  Constitutional: He is oriented to person, place, and time. He appears well-developed and well-nourished.  HENT:  Head: Normocephalic and atraumatic.  Mouth/Throat: No oropharyngeal exudate.  Eyes: EOM are normal. Pupils are equal, round, and reactive to light.  Neck: Neck supple. No tracheal deviation present.  Cardiovascular: Normal rate, regular rhythm and intact distal pulses.   No murmur heard. Pulmonary/Chest: Effort normal. No respiratory distress. He has no wheezes. He has no rales.  Abdominal: Soft. Bowel sounds are normal. He exhibits no distension. There is no tenderness.  Musculoskeletal: He exhibits no edema.  Neurological: He is alert and oriented to person, place, and time.  Skin: Skin is warm and dry. Rash noted.  Small, monomorphous papules noted on the sides of the chest  and flank region bilaterally.  Hypopigmentation of skin noted in the axilla bilaterally.    Assessment & Plan:

## 2015-04-20 NOTE — Assessment & Plan Note (Signed)
BP Readings from Last 3 Encounters:  04/19/15 107/69  02/15/15 134/81  10/24/14 105/73    Lab Results  Component Value Date   NA 136 07/22/2014   K 4.4 07/22/2014   CREATININE 0.80 07/22/2014    Assessment: Blood pressure control:   well-controlled Progress toward BP goal:    at goal, less than 140/90 Comments: He is currently on hydrochlorothiazide 12.5 mg daily.  Plan: Medications:  continue current medications Educational resources provided:   Encouraged healthy eating and exercise. Other plans: Patient motivated to quit smoking, started on nicotine patches at this visit.

## 2015-04-26 NOTE — Progress Notes (Signed)
I saw and evaluated the patient.  I personally confirmed the key portions of Dr. Rathore's history and exam and reviewed pertinent patient test results.  The assessment, diagnosis, and plan were formulated together and I agree with the documentation in the resident's note. 

## 2015-04-27 ENCOUNTER — Other Ambulatory Visit: Payer: Self-pay | Admitting: Internal Medicine

## 2015-04-27 NOTE — Telephone Encounter (Signed)
Called pharm, they have the script but the patches are on back order, they will call pt and explain this

## 2015-04-27 NOTE — Telephone Encounter (Signed)
Pt requesting nicotine patch to be call to [facility] on [address].

## 2015-06-06 ENCOUNTER — Other Ambulatory Visit: Payer: Self-pay | Admitting: Internal Medicine

## 2015-06-06 NOTE — Telephone Encounter (Signed)
Pt requesting nicotine patch to be filled @ [facility] on [address].

## 2015-06-08 MED ORDER — NICOTINE 7 MG/24HR TD PT24: 7.0000 mg | MEDICATED_PATCH | TRANSDERMAL | Status: DC

## 2015-06-27 ENCOUNTER — Other Ambulatory Visit: Payer: Self-pay | Admitting: Internal Medicine

## 2015-06-27 MED ORDER — NICOTINE 7 MG/24HR TD PT24: 7.0000 mg | MEDICATED_PATCH | TRANSDERMAL | Status: DC

## 2015-06-27 NOTE — Telephone Encounter (Signed)
Pt requesting nicotine patch to be filled @ [facility] on [address].

## 2015-09-21 ENCOUNTER — Other Ambulatory Visit: Payer: Self-pay | Admitting: Internal Medicine

## 2015-11-13 ENCOUNTER — Telehealth: Payer: Self-pay | Admitting: Internal Medicine

## 2015-11-13 NOTE — Telephone Encounter (Signed)
APT. REMINDER CALL, NO ANSWER °

## 2015-11-14 ENCOUNTER — Encounter: Payer: Self-pay | Admitting: Internal Medicine

## 2015-11-14 ENCOUNTER — Ambulatory Visit (INDEPENDENT_AMBULATORY_CARE_PROVIDER_SITE_OTHER): Payer: BC Managed Care – PPO | Admitting: Internal Medicine

## 2015-11-14 VITALS — BP 109/65 | HR 94 | Temp 98.2°F | Ht 74.0 in | Wt 232.7 lb

## 2015-11-14 DIAGNOSIS — N529 Male erectile dysfunction, unspecified: Secondary | ICD-10-CM

## 2015-11-14 DIAGNOSIS — I10 Essential (primary) hypertension: Secondary | ICD-10-CM | POA: Diagnosis not present

## 2015-11-14 DIAGNOSIS — L209 Atopic dermatitis, unspecified: Secondary | ICD-10-CM

## 2015-11-14 DIAGNOSIS — Z872 Personal history of diseases of the skin and subcutaneous tissue: Secondary | ICD-10-CM | POA: Diagnosis not present

## 2015-11-14 DIAGNOSIS — E785 Hyperlipidemia, unspecified: Secondary | ICD-10-CM

## 2015-11-14 DIAGNOSIS — Z09 Encounter for follow-up examination after completed treatment for conditions other than malignant neoplasm: Secondary | ICD-10-CM

## 2015-11-14 DIAGNOSIS — F17210 Nicotine dependence, cigarettes, uncomplicated: Secondary | ICD-10-CM

## 2015-11-14 DIAGNOSIS — F528 Other sexual dysfunction not due to a substance or known physiological condition: Secondary | ICD-10-CM

## 2015-11-14 DIAGNOSIS — F17200 Nicotine dependence, unspecified, uncomplicated: Secondary | ICD-10-CM

## 2015-11-14 MED ORDER — ATORVASTATIN CALCIUM 40 MG PO TABS: 40.0000 mg | ORAL_TABLET | Freq: Every day | ORAL | Status: DC

## 2015-11-14 MED ORDER — NICOTINE 7 MG/24HR TD PT24: 7.0000 mg | MEDICATED_PATCH | TRANSDERMAL | Status: DC

## 2015-11-14 MED ORDER — NICOTINE 14 MG/24HR TD PT24: 14.0000 mg | MEDICATED_PATCH | Freq: Every day | TRANSDERMAL | Status: DC

## 2015-11-14 MED ORDER — SILDENAFIL CITRATE 20 MG PO TABS: ORAL_TABLET | ORAL | Status: DC

## 2015-11-14 MED ORDER — NICOTINE POLACRILEX 2 MG MT GUM: CHEWING_GUM | OROMUCOSAL | Status: DC

## 2015-11-14 NOTE — Patient Instructions (Signed)
STOP taking Pravastatin  Start taking Atorvastatin  Continue taking Hydrochlorothiazide   Nicoderm patch:  Use 14 mg/ day patch daily for 6 weeks, then 7 mg/ day patch daily for 2 weeks You may chew nicotine gum along with the patch as instructed.   Take Sildenafil as instructed.   Return for a follow-up visit in 2 months.

## 2015-11-15 ENCOUNTER — Other Ambulatory Visit: Payer: Self-pay

## 2015-11-15 NOTE — Telephone Encounter (Signed)
These prescriptions were sent to [facility], [facility] no longer stocks these patches.  Please resend to [facility].  Thank you!

## 2015-11-16 MED ORDER — NICOTINE 14 MG/24HR TD PT24: 14.0000 mg | MEDICATED_PATCH | Freq: Every day | TRANSDERMAL | Status: DC

## 2015-11-16 MED ORDER — NICOTINE 7 MG/24HR TD PT24: 7.0000 mg | MEDICATED_PATCH | TRANSDERMAL | Status: DC

## 2015-11-16 NOTE — Assessment & Plan Note (Signed)
Assessment: Patient states his rash has resolved.  Plan: -No acute intervention indicated at this time

## 2015-11-16 NOTE — Progress Notes (Signed)
Patient ID: Anthony Campos, male   DOB: 06/22/1956, 60 y.o.   MRN: [id number]   Subjective:   Patient ID: Anthony Campos male   DOB: Aug 01, 1955 60 y.o.   MRN: [id number]  HPI: [name] is a 59 y.o. male with a past medical history of conditions listed below presenting to the clinic to discuss his hypertension, hyperlipidemia, erectile dysfunction, and tobacco abuse. Please refer to the assessment and plan section for the status of the patient's chronic medical conditions.    Past Medical History  Diagnosis Date  . Hyperlipidemia   . Hypertension   . Erectile dysfunction   . Lipoma   . Tobacco abuse   . Infection of skin and subcutaneous tissue    Current Outpatient Prescriptions  Medication Sig Dispense Refill  . atorvastatin (LIPITOR) 40 MG tablet Take 1 tablet (40 mg total) by mouth daily. 90 tablet 3  . hydrochlorothiazide (HYDRODIURIL) 12.5 MG tablet TAKE ONE TABLET BY MOUTH DAILY 90 tablet 1  . nicotine (NICODERM CQ - DOSED IN MG/24 HOURS) 14 mg/24hr patch Place 1 patch (14 mg total) onto the skin daily. 42 patch 0  . nicotine (NICODERM CQ - DOSED IN MG/24 HR) 7 mg/24hr patch Place 1 patch (7 mg total) onto the skin daily. 14 patch 0  . nicotine polacrilex (EQ NICOTINE POLACRILEX) 2 MG gum Chew 1 gum every 2 hours as needed (MAXIMUM 24 pieces/ day) 100 tablet 1  . sildenafil (REVATIO) 20 MG tablet Take 1-2 tablets once daily as needed. 30 tablet 0  . zoster vaccine live, PF, (ZOSTAVAX) 91478 UNT/0.65ML injection Inject 19,400 Units into the skin once. 1 each 0   No current facility-administered medications for this visit.   Family History  Problem Relation Age of Onset  . Depression Sister   . Heart disease Neg Hx   . Hypertension Neg Hx   . Stroke Neg Hx    Social History   Social History  . Marital Status: Married    Spouse Name: N/A  . Number of Children: N/A  . Years of Education: N/A   Social History Main Topics  . Smoking status: Current Every Day Smoker -- 0.25  packs/day    Types: Cigarettes  . Smokeless tobacco: None     Comment:  has cut back  . Alcohol Use: 0.0 oz/week    0 Standard drinks or equivalent per week     Comment: Beer - rarely.  . Drug Use: No  . Sexual Activity: Not Asked   Other Topics Concern  . None   Social History Narrative   Occupation- Radiation protection practitioner.   Lives with his wife. Has adult children and three grandchildren.   Review of Systems: Review of Systems  Constitutional: Negative for fever, chills and malaise/fatigue.  HENT: Negative for congestion and sore throat.   Eyes: Negative for blurred vision and pain.  Respiratory: Negative for cough, shortness of breath and wheezing.   Cardiovascular: Negative for chest pain, palpitations and leg swelling.  Gastrointestinal: Negative for nausea, vomiting, abdominal pain and diarrhea.  Genitourinary: Negative for dysuria and flank pain.  Musculoskeletal: Negative for myalgias.       Occasional bilateral knee pain (chronic and stable as per patient)  Skin: Negative for itching and rash.  Neurological: Negative for dizziness, sensory change, focal weakness and headaches.   Objective:  Physical Exam: Filed Vitals:   11/14/15 1538  BP: 109/65  Pulse: 94  Temp: 98.2 F (36.8 C)  TempSrc: Oral  Height: 6'  2" (1.88 m)  Weight: 232 lb 11.2 oz (105.552 kg)  SpO2: 99%   Physical Exam  Constitutional: He is oriented to person, place, and time. He appears well-developed and well-nourished. No distress.  HENT:  Head: Normocephalic and atraumatic.  Mouth/Throat: Oropharynx is clear and moist. No oropharyngeal exudate.  Eyes: EOM are normal. Pupils are equal, round, and reactive to light.  Neck: Normal range of motion. Neck supple. No tracheal deviation present.  Cardiovascular: Normal rate, regular rhythm and intact distal pulses.  Exam reveals no gallop and no friction rub.   No murmur heard. Pulmonary/Chest: Effort normal and breath sounds normal. No  respiratory distress. He has no wheezes. He has no rales.  Abdominal: Soft. Bowel sounds are normal. He exhibits no distension. There is no tenderness.  Musculoskeletal: Normal range of motion. He exhibits no edema or tenderness.  Neurological: He is alert and oriented to person, place, and time.  Skin: Skin is warm and dry. No rash noted. He is not diaphoretic. No erythema.   Assessment & Plan:

## 2015-11-16 NOTE — Assessment & Plan Note (Signed)
BP Readings from Last 3 Encounters:  11/14/15 109/65  04/19/15 107/69  02/15/15 134/81    Lab Results  Component Value Date   NA 136 07/22/2014   K 4.4 07/22/2014   CREATININE 0.80 07/22/2014    Assessment: Blood pressure control:  well-controlled Progress toward BP goal:   at goal (less than 140/90) Comments: He is currently taking hydrochlorothiazide 12.5 mg daily.  Plan: Medications:  continue current medications Educational resources provided:  encouraged healthy eating and exercise.

## 2015-11-16 NOTE — Assessment & Plan Note (Signed)
Assessment: Patient is currently taking Viagra 50 mg daily as needed and states it helps with his erectile dysfunction. He is requesting a refill on this medication but is concerned about the cost.  Plan: -Generic medication has been prescribed: Sildenafil 20 mg 1-2 tablets per day as needed (#30)

## 2015-11-16 NOTE — Assessment & Plan Note (Signed)
Assessment: Patient was previously prescribed nicotine patches. States he successfully quit for 4-5 months but then started smoking again due to life stressors. At present he smoking 1 cigarette per day. I spent a dedicated 5 minutes discussing the importance of smoking cessation with him. Patient stated he is ready to quit again.  Plan: -NicoDerm 14 mg per day patch for 6 weeks, then NicoDerm 7 mg per day patch for 2 weeks.  -Nicotine gum for cravings -Return to the clinic in 8 weeks

## 2015-11-16 NOTE — Assessment & Plan Note (Signed)
Assessment: Patient is currently on pravastatin 40 mg daily but reports being noncompliant. Last lipid panel done in August 2016 showing cholesterol 201, HDL 46, and LDL 127.  Plan: -Discontinue pravastatin -Start Lipitor 40 mg daily (high intensity statin therapy) -Check lipid panel at next visit in 8 weeks

## 2015-11-20 NOTE — Progress Notes (Signed)
Internal Medicine Clinic Attending  Case discussed with Dr. Rathoreat the time of the visit. We reviewed the resident's history and exam and pertinent patient test results. I agree with the assessment, diagnosis, and plan of care documented in the resident's note.  

## 2015-12-18 ENCOUNTER — Encounter: Payer: Self-pay | Admitting: Gastroenterology

## 2016-02-09 ENCOUNTER — Encounter: Payer: Self-pay | Admitting: Gastroenterology

## 2016-03-28 ENCOUNTER — Ambulatory Visit (AMBULATORY_SURGERY_CENTER): Payer: Self-pay | Admitting: *Deleted

## 2016-03-28 ENCOUNTER — Encounter (INDEPENDENT_AMBULATORY_CARE_PROVIDER_SITE_OTHER): Payer: Self-pay

## 2016-03-28 VITALS — Ht 74.0 in | Wt 231.0 lb

## 2016-03-28 DIAGNOSIS — Z1211 Encounter for screening for malignant neoplasm of colon: Secondary | ICD-10-CM

## 2016-03-28 MED ORDER — NA SULFATE-K SULFATE-MG SULF 17.5-3.13-1.6 GM/177ML PO SOLN: 1.0000 | Freq: Once | ORAL | 0 refills | Status: AC

## 2016-03-28 NOTE — Progress Notes (Signed)
No egg or soy allergy known to patient  No issues with past sedation with any surgeries  or procedures, no intubation problems  No diet pills per patient No home 02 use per patient  No blood thinners per patient  Pt denies issues with constipation  No A fib or A flutter   

## 2016-03-29 ENCOUNTER — Encounter: Payer: Self-pay | Admitting: Gastroenterology

## 2016-04-09 ENCOUNTER — Encounter: Payer: BC Managed Care – PPO | Admitting: Gastroenterology

## 2016-04-12 ENCOUNTER — Ambulatory Visit (AMBULATORY_SURGERY_CENTER): Payer: BC Managed Care – PPO | Admitting: Gastroenterology

## 2016-04-12 ENCOUNTER — Encounter: Payer: Self-pay | Admitting: Gastroenterology

## 2016-04-12 VITALS — BP 123/89 | HR 71 | Temp 98.4°F | Resp 20 | Ht 74.0 in | Wt 231.0 lb

## 2016-04-12 DIAGNOSIS — D122 Benign neoplasm of ascending colon: Secondary | ICD-10-CM

## 2016-04-12 DIAGNOSIS — K5730 Diverticulosis of large intestine without perforation or abscess without bleeding: Secondary | ICD-10-CM

## 2016-04-12 DIAGNOSIS — Z1211 Encounter for screening for malignant neoplasm of colon: Secondary | ICD-10-CM

## 2016-04-12 HISTORY — PX: COLONOSCOPY: SHX174

## 2016-04-12 MED ORDER — SODIUM CHLORIDE 0.9 % IV SOLN: 500.0000 mL | INTRAVENOUS | Status: DC

## 2016-04-12 NOTE — Progress Notes (Signed)
To recovery. Report to [facility], RN, VSS

## 2016-04-12 NOTE — Op Note (Signed)
[place] Patient Name: Anthony Campos Procedure Date: 04/12/2016 7:32 AM MRN: [id number] Endoscopist: Milus Banister , MD Age: 60 Referring MD:  Date of Birth: Aug 28, 1955 Gender: Male Account #: [id number] Procedure:                Colonoscopy Indications:              Screening for colorectal malignant neoplasm;                            colonoscopy 2007 Dr. Ardis Hughs, no polyps Medicines:                Monitored Anesthesia Care Procedure:                Pre-Anesthesia Assessment:                           - Prior to the procedure, a History and Physical                            was performed, and patient medications and                            allergies were reviewed. The patient's tolerance of                            previous anesthesia was also reviewed. The risks                            and benefits of the procedure and the sedation                            options and risks were discussed with the patient.                            All questions were answered, and informed consent                            was obtained. Prior Anticoagulants: The patient has                            taken no previous anticoagulant Campos antiplatelet                            agents. ASA Grade Assessment: II - A patient with                            mild systemic disease. After reviewing the risks                            and benefits, the patient was deemed in                            satisfactory condition to undergo the procedure.  After obtaining informed consent, the colonoscope                            was passed under direct vision. Throughout the                            procedure, the patient's blood pressure, pulse, and                            oxygen saturations were monitored continuously. The                            Model CF-HQ190L [id number]) scope was introduced                            through the anus and advanced  to the the cecum,                            identified by appendiceal orifice and ileocecal                            valve. The colonoscopy was performed without                            difficulty. The patient tolerated the procedure                            well. The quality of the bowel preparation was                            good. The ileocecal valve, appendiceal orifice, and                            rectum were photographed. Scope In: 7:36:29 AM Scope Out: [id number] AM Scope Withdrawal Time: 0 hours 6 minutes 15 seconds  Total Procedure Duration: 0 hours 10 minutes 17 seconds  Findings:                 A 3 mm polyp was found in the ascending colon. The                            polyp was sessile. The polyp was removed with a                            cold snare. Resection and retrieval were complete.                           Multiple small-mouthed diverticula were found in                            the left colon.                           The exam was otherwise without abnormality on  direct and retroflexion views. Complications:            No immediate complications. Estimated blood loss:                            None. Estimated Blood Loss:     Estimated blood loss: none. Impression:               - One 3 mm polyp in the ascending colon, removed                            with a cold snare. Resected and retrieved.                           - Diverticulosis in the left colon.                           - The examination was otherwise normal on direct                            and retroflexion views. Recommendation:           - Patient has a contact number available for                            emergencies. The signs and symptoms of potential                            delayed complications were discussed with the                            patient. Return to normal activities tomorrow.                            Written discharge  instructions were provided to the                            patient.                           - Resume previous diet.                           - Continue present medications.                           You will receive a letter within 2-3 weeks with the                            pathology results and my final recommendations.                           If the polyp(s) is proven to be 'pre-cancerous' on                            pathology, you will need repeat colonoscopy in 5  years. If the polyp(s) is NOT 'precancerous' on                            pathology then you should repeat colon cancer                            screening in 10 years with colonoscopy without need                            for colon cancer screening by any method prior to                            then (including stool testing). Milus Banister, MD 04/12/2016 7:49:31 AM This report has been signed electronically.

## 2016-04-12 NOTE — Patient Instructions (Signed)
YOU HAD AN ENDOSCOPIC PROCEDURE TODAY AT [facility]:   Refer to the procedure report that was given to you for any specific questions about what was found during the examination.  If the procedure report does not answer your questions, please call your gastroenterologist to clarify.  If you requested that your care partner not be given the details of your procedure findings, then the procedure report has been included in a sealed envelope for you to review at your convenience later.  YOU SHOULD EXPECT: Some feelings of bloating in the abdomen. Passage of more gas than usual.  Walking can help get rid of the air that was put into your GI tract during the procedure and reduce the bloating. If you had a lower endoscopy (such as a colonoscopy or flexible sigmoidoscopy) you may notice spotting of blood in your stool or on the toilet paper. If you underwent a bowel prep for your procedure, you may not have a normal bowel movement for a few days.  Please Note:  You might notice some irritation and congestion in your nose or some drainage.  This is from the oxygen used during your procedure.  There is no need for concern and it should clear up in a day or so.  SYMPTOMS TO REPORT IMMEDIATELY:   Following lower endoscopy (colonoscopy or flexible sigmoidoscopy):  Excessive amounts of blood in the stool  Significant tenderness or worsening of abdominal pains  Swelling of the abdomen that is new, acute  Fever of 100F or higher   For urgent or emergent issues, a gastroenterologist can be reached at any hour by calling [phone].   DIET:  We do recommend a small meal at first, but then you may proceed to your regular diet.  Drink plenty of fluids but you should avoid alcoholic beverages for 24 hours.  ACTIVITY:  You should plan to take it easy for the rest of today and you should NOT DRIVE or use heavy machinery until tomorrow (because of the sedation medicines used during the test).     FOLLOW UP: Our staff will call the number listed on your records the next business day following your procedure to check on you and address any questions or concerns that you may have regarding the information given to you following your procedure. If we do not reach you, we will leave a message.  However, if you are feeling well and you are not experiencing any problems, there is no need to return our call.  We will assume that you have returned to your regular daily activities without incident.  If any biopsies were taken you will be contacted by phone or by letter within the next 1-3 weeks.  Please call us at [phone] if you have not heard about the biopsies in 3 weeks.    SIGNATURES/CONFIDENTIALITY: You and/or your care partner have signed paperwork which will be entered into your electronic medical record.  These signatures attest to the fact that that the information above on your After Visit Summary has been reviewed and is understood.  Full responsibility of the confidentiality of this discharge information lies with you and/or your care-partner.   Information on polyps and diverticulosis given to you today  Await letter from Dr. Ardis Hughs with pathology results  Resume usual medications and diet

## 2016-04-12 NOTE — Progress Notes (Signed)
Called to room to assist during endoscopic procedure.  Patient ID and intended procedure confirmed with present staff. Received instructions for my participation in the procedure from the performing physician.  

## 2016-04-15 ENCOUNTER — Telehealth: Payer: Self-pay

## 2016-04-15 ENCOUNTER — Encounter (HOSPITAL_COMMUNITY): Payer: Self-pay | Admitting: *Deleted

## 2016-04-15 ENCOUNTER — Ambulatory Visit (HOSPITAL_COMMUNITY)
Admission: EM | Admit: 2016-04-15 | Discharge: 2016-04-15 | Disposition: A | Discharge status: Home / Self Care | Payer: BC Managed Care – PPO | Attending: Internal Medicine | Admitting: Internal Medicine

## 2016-04-15 NOTE — Telephone Encounter (Signed)
  Follow up Call-  Call back number 04/12/2016  Post procedure Call Back phone  # [phone]  Permission to leave phone message Yes  Some recent data might be hidden    Patient was called for follow up after his procedure on 04/12/2016. No answer at the number given for follow up phone call. I was not able to leave a message on voice mail.

## 2016-04-15 NOTE — Telephone Encounter (Signed)
  Follow up Call-  Call back number 04/12/2016  Post procedure Call Back phone  # [phone]  Permission to leave phone message Yes  Some recent data might be hidden    Patient was called for follow up after his procedure on 04/12/2016. No answer at the number given for follow up phone call. A message was left on the answering machine. This was the second attempt to contact the patient.

## 2016-04-15 NOTE — ED Triage Notes (Signed)
Patient reports scratching his left this evening and then noted large amount of blood coming from the area. Area is wrapped at this time and bleeding appears to be controlled. Patient with noted with varicose veins noted to legs.

## 2016-04-18 ENCOUNTER — Encounter: Payer: Self-pay | Admitting: Gastroenterology

## 2016-06-19 ENCOUNTER — Other Ambulatory Visit: Payer: Self-pay | Admitting: Internal Medicine

## 2016-10-24 ENCOUNTER — Ambulatory Visit (HOSPITAL_COMMUNITY)
Admission: EM | Admit: 2016-10-24 | Discharge: 2016-10-24 | Disposition: A | Discharge status: Home / Self Care | Payer: BC Managed Care – PPO | Attending: Family Medicine | Admitting: Family Medicine

## 2016-10-24 ENCOUNTER — Encounter (HOSPITAL_COMMUNITY): Payer: Self-pay | Admitting: Emergency Medicine

## 2016-10-24 DIAGNOSIS — T148XXA Other injury of unspecified body region, initial encounter: Secondary | ICD-10-CM | POA: Diagnosis not present

## 2016-10-24 MED ORDER — PREDNISONE 20 MG PO TABS: ORAL_TABLET | ORAL | 0 refills | Status: DC

## 2016-10-24 NOTE — Discharge Instructions (Signed)
Please return if you're not improving by Sunday.

## 2016-10-24 NOTE — ED Triage Notes (Signed)
Here for lower back pain/buttocks onset 6 days... Pain increases w/activity  Reports he was lifting heavy flower pots on Saturday and believes he may have pulled a muscle  Not taking any meds   Slow steady gait... A&O x4... NAD

## 2016-10-24 NOTE — ED Provider Notes (Signed)
[facility]    CSN: [id number] Arrival date & time: 10/24/16  1037     History   Chief Complaint Chief Complaint  Patient presents with  . Back Pain    HPI Anthony Campos is a 61 y.o. male.   Here for lower back pain/buttocks onset 6 days... Pain increases w/activity  Reports he was lifting heavy flower pots on Saturday and believes he may have pulled a muscle  Not taking any meds   Slow steady gait... A&O x4... NAD  This is a 61 year old gentleman who works at [employer] and [employer]. He was moving flowerpots about 5 days ago and developed pain in his Buttocks.  The pain is persisted despite taking over-the-counter medication. Pain is bilateral. He has no pain in his calves, his low back, or his abdomen. Said no difficulty with urination or elimination. He notices no weakness in the lower extremities.      Past Medical History:  Diagnosis Date  . Erectile dysfunction   . Hyperlipidemia   . Hypertension   . Infection of skin and subcutaneous tissue   . Lipoma   . Tobacco abuse     Patient Active Problem List   Diagnosis Date Noted  . Healthcare maintenance 02/16/2015  . Medial meniscus tear 07/18/2014  . Fall down stairs 11/10/2013  . TOBACCO ABUSE 12/22/2009  . Essential hypertension, benign 10/20/2008  . LIPOMA 09/22/2007  . ERECTILE DYSFUNCTION 07/24/2006  . Hyperlipidemia, mild 05/20/2006    Past Surgical History:  Procedure Laterality Date  . COLONOSCOPY     01-2006  . HERNIA REPAIR  2005   Right hernia repair        Home Medications    Prior to Admission medications   Medication Sig Start Date End Date Taking? Authorizing Provider  hydrochlorothiazide (HYDRODIURIL) 12.5 MG tablet TAKE 1 TABLET BY MOUTH EVERY DAY 06/19/16  Yes Shela Leff, MD  atorvastatin (LIPITOR) 40 MG tablet Take 1 tablet (40 mg total) by mouth daily. 11/14/15 11/13/16  Shela Leff, MD  nicotine (NICODERM CQ - DOSED IN MG/24  HOURS) 21 mg/24hr patch Place 21 mg onto the skin daily.    Historical Provider, MD  predniSONE (DELTASONE) 20 MG tablet Two daily with food 10/24/16   Robyn Haber, MD  sildenafil (REVATIO) 20 MG tablet Take 1-2 tablets once daily as needed. Patient not taking: Reported on 04/12/2016 11/14/15   Shela Leff, MD    Family History Family History  Problem Relation Age of Onset  . Depression Sister   . Heart disease Neg Hx   . Hypertension Neg Hx   . Stroke Neg Hx   . Colon polyps Neg Hx   . Colon cancer Neg Hx   . Rectal cancer Neg Hx   . Stomach cancer Neg Hx   . Esophageal cancer Neg Hx     Social History Social History  Substance Use Topics  . Smoking status: Current Every Day Smoker    Packs/day: 0.25    Types: Cigarettes  . Smokeless tobacco: Never Used     Comment:  has cut back- 3 cigs a day   . Alcohol use 0.0 oz/week     Comment: Beer - rarely.- holidays like thanksgiving and x mas      Allergies   Patient has no known allergies.   Review of Systems Review of Systems  Musculoskeletal: Positive for gait problem and myalgias.  All other systems reviewed and are negative.    Physical Exam  Triage Vital Signs ED Triage Vitals [10/24/16 1114]  Enc Vitals Group     BP      Pulse      Resp      Temp      Temp src      SpO2      Weight      Height      Head Circumference      Peak Flow      Pain Score 6     Pain Loc      Pain Edu?      Excl. in [place]?    No data found.   Updated Vital Signs There were no vitals taken for this visit.   Physical Exam  Constitutional: He is oriented to person, place, and time. He appears well-developed and well-nourished.  HENT:  Right Ear: External ear normal.  Left Ear: External ear normal.  Mouth/Throat: Oropharynx is clear and moist.  Eyes: Conjunctivae and EOM are normal. Pupils are equal, round, and reactive to light.  Neck: Normal range of motion. Neck supple.  Pulmonary/Chest: Effort normal.    Musculoskeletal: Normal range of motion.  Patient has bilateral straight leg raising signs at 30. Internal and external rotation of the hips is similarly uncomfortable.  Sensation of the lower extremities is normal and patient has good strength.  Neurological: He is alert and oriented to person, place, and time.  Skin: Skin is warm and dry.  Nursing note and vitals reviewed.    UC Treatments / Results  Labs (all labs ordered are listed, but only abnormal results are displayed) Labs Reviewed - No data to display  EKG  EKG Interpretation None       Radiology No results found.  Procedures Procedures (including critical care time)  Medications Ordered in UC Medications - No data to display   Initial Impression / Assessment and Plan / UC Course  I have reviewed the triage vital signs and the nursing notes.  Pertinent labs & imaging results that were available during my care of the patient were reviewed by me and considered in my medical decision making (see chart for details).     Final Clinical Impressions(s) / UC Diagnoses   Final diagnoses:  Muscle strain    New Prescriptions New Prescriptions   PREDNISONE (DELTASONE) 20 MG TABLET    Two daily with food     Robyn Haber, MD 10/24/16 1153

## 2016-11-13 ENCOUNTER — Other Ambulatory Visit: Payer: Self-pay | Admitting: Internal Medicine

## 2016-12-10 ENCOUNTER — Ambulatory Visit (INDEPENDENT_AMBULATORY_CARE_PROVIDER_SITE_OTHER): Payer: BC Managed Care – PPO | Admitting: Internal Medicine

## 2016-12-10 ENCOUNTER — Encounter: Payer: Self-pay | Admitting: Internal Medicine

## 2016-12-10 VITALS — BP 132/84 | HR 86 | Temp 98.1°F | Ht 74.0 in | Wt 232.0 lb

## 2016-12-10 DIAGNOSIS — S0036XA Insect bite (nonvenomous) of nose, initial encounter: Secondary | ICD-10-CM | POA: Diagnosis not present

## 2016-12-10 DIAGNOSIS — E785 Hyperlipidemia, unspecified: Secondary | ICD-10-CM

## 2016-12-10 DIAGNOSIS — N529 Male erectile dysfunction, unspecified: Secondary | ICD-10-CM

## 2016-12-10 DIAGNOSIS — I10 Essential (primary) hypertension: Secondary | ICD-10-CM | POA: Diagnosis not present

## 2016-12-10 DIAGNOSIS — F17200 Nicotine dependence, unspecified, uncomplicated: Secondary | ICD-10-CM

## 2016-12-10 DIAGNOSIS — F528 Other sexual dysfunction not due to a substance or known physiological condition: Secondary | ICD-10-CM

## 2016-12-10 DIAGNOSIS — Z79899 Other long term (current) drug therapy: Secondary | ICD-10-CM

## 2016-12-10 DIAGNOSIS — W57XXXA Bitten or stung by nonvenomous insect and other nonvenomous arthropods, initial encounter: Secondary | ICD-10-CM | POA: Diagnosis not present

## 2016-12-10 DIAGNOSIS — F17210 Nicotine dependence, cigarettes, uncomplicated: Secondary | ICD-10-CM

## 2016-12-10 MED ORDER — SILDENAFIL CITRATE 20 MG PO TABS: ORAL_TABLET | ORAL | 0 refills | Status: DC

## 2016-12-10 MED ORDER — CETIRIZINE HCL 10 MG PO CAPS: 10.0000 mg | ORAL_CAPSULE | Freq: Every day | ORAL | 0 refills | Status: DC

## 2016-12-10 MED ORDER — ATORVASTATIN CALCIUM 40 MG PO TABS: ORAL_TABLET | ORAL | 3 refills | Status: DC

## 2016-12-10 NOTE — Patient Instructions (Addendum)
Mr. Molinelli it was nice seeing you today.  -Take Zyrtec 10 mg daily for the insect bite. Please call the clinic if you do not notice any improvement within the next 5-7 days. Otherwise return for follow-up visit in 6 months.

## 2016-12-11 LAB — BMP8+ANION GAP
ANION GAP: 9 mmol/L — AB (ref 10.0–18.0)
BUN/Creatinine Ratio: 23 (ref 10–24)
BUN: 19 mg/dL (ref 8–27)
CALCIUM: 8.9 mg/dL (ref 8.6–10.2)
CO2: 29 mmol/L (ref 18–29)
CREATININE: 0.83 mg/dL (ref 0.76–1.27)
Chloride: 104 mmol/L (ref 96–106)
GFR calc Af Amer: 110 mL/min/{1.73_m2} (ref >59)
GFR, EST NON AFRICAN AMERICAN: 95 mL/min/{1.73_m2} (ref >59)
Glucose: 117 mg/dL — ABNORMAL HIGH (ref 65–99)
Potassium: 4 mmol/L (ref 3.5–5.2)
SODIUM: 142 mmol/L (ref 134–144)

## 2016-12-11 LAB — LIPID PANEL
CHOL/HDL RATIO: 3 ratio (ref 0.0–5.0)
CHOLESTEROL TOTAL: 134 mg/dL (ref 100–199)
HDL: 45 mg/dL (ref >39)
LDL Calculated: 72 mg/dL (ref 0–99)
Triglycerides: 83 mg/dL (ref 0–149)
VLDL CHOLESTEROL CAL: 17 mg/dL (ref 5–40)

## 2016-12-11 NOTE — Progress Notes (Signed)
   CC: Patient is here to discuss a recent insect bite and erectile dysfunction. Hypertension, tobacco use, and hyperlipidemia were also discussed doing this visit.  HPI:  [name] is a 62 y.o. male with a past medical history of conditions listed below presenting to the clinic to discuss her recent insect bite and erectile dysfunction. Hypertension, tobacco use, and hyperlipidemia were also discussed during this visit. Please see problem based charting for the status of the patient's current and chronic medical conditions.   Past Medical History:  Diagnosis Date  . Erectile dysfunction   . Hyperlipidemia   . Hypertension   . Infection of skin and subcutaneous tissue   . Lipoma   . Tobacco abuse     Review of Systems:   Review of Systems  Constitutional: Negative for chills and fever.  Respiratory: Negative for shortness of breath and wheezing.   Cardiovascular: Negative for chest pain and leg swelling.  Gastrointestinal: Negative for abdominal pain, nausea and vomiting.  Skin: Positive for itching.       Itching at the site of insect bite    Physical Exam:  Vitals:   12/10/16 1635  BP: 132/84  Pulse: 86  Temp: 98.1 F (36.7 C)  TempSrc: Oral  SpO2: 100%  Weight: 232 lb (105.2 kg)  Height: 6\' 2"  (1.88 m)   Physical Exam  Constitutional: He is oriented to person, place, and time. He appears well-developed and well-nourished. No distress.  HENT:  Head: Normocephalic and atraumatic.  Eyes: Right eye exhibits no discharge. Left eye exhibits no discharge.  Neck: Neck supple. No tracheal deviation present.  Cardiovascular: Normal rate, regular rhythm and intact distal pulses.   Pulmonary/Chest: Effort normal and breath sounds normal. No respiratory distress. He has no wheezes. He has no rales.  Abdominal: Soft. Bowel sounds are normal. He exhibits no distension. There is no tenderness.  Musculoskeletal: He exhibits no edema.  Neurological: He is alert and oriented to  person, place, and time.  Skin: Skin is warm and dry.  1/2 cm indurated lesion in the area of the bridge of the nose on the left. No erythema or drainage noted.    Assessment & Plan:   See Encounters Tab for problem based charting.  Patient discussed with Dr. Dareen Piano

## 2016-12-11 NOTE — Assessment & Plan Note (Signed)
BP Readings from Last 3 Encounters:  12/10/16 132/84  04/15/16 136/90  04/12/16 123/89    Lab Results  Component Value Date   NA 142 12/10/2016   K 4.0 12/10/2016   CREATININE 0.83 12/10/2016    Assessment: Blood pressure control:  well-controlled Comments: Currently on hydrochlorothiazide 12.5 mg daily. Creatinine checked at this visit stable at 0.8. No electrolyte abnormalities.  Plan: Medications:  continue current medications Educational resources provided:   Educated patient about healthy eating and exercise. Emphasized the importance of weight loss.

## 2016-12-11 NOTE — Assessment & Plan Note (Signed)
Assessment Patient reports being bit by an insect at the bridge of his nose while being outdoors 2 weeks ago. States the area has been itching since then but overall there is improvement. States he has been using over-the-counter hydrocortisone cream. Denies noticing any drainage. On exam, a 1/2 cm indurated lesion noted on the left side in the area of the bridge of nose. No erythema or drainage; no signs of infection.  Plan -Advised patient to refrain from scratching the area -Zyrtec 10 mg daily -Advised him to call the clinic if he does not notice improvement in the next 5-7 days

## 2016-12-11 NOTE — Assessment & Plan Note (Signed)
Assessment Patient continues to smoke 2 cigarettes per day and is not interested in quitting at this time. He was previously prescribed nicotine patches and has not been using them.  Plan -Continue to counsel patient on smoking cessation during future visits

## 2016-12-11 NOTE — Assessment & Plan Note (Signed)
Assessment Currently on Lipitor 40 mg daily. Lipid panel at this visit appears improved - cholesterol 134, HDL 45, and LDL 72.  Plan -Continue high intensity statin therapy

## 2016-12-11 NOTE — Assessment & Plan Note (Signed)
Assessment Patient states he has been using sildenafil 40 mg as needed for erectile dysfunction. He is requesting a higher dose stating the current dose is not helping him. States both him and his wife are experiencing a lot of stress due to their busy work schedules which is causing them to have a lack of desire for sex. Patient does state he is able to have erections without the medication whenever he has desire to engage in sexual activity. I explained to him that his erectile dysfunction is likely not organic in nature as he is able to have erections. His problem is likely psychological due to life stressors. Explained to him that increasing the dose of sildenafil would likely not help him and that he might not need this medication to begin with. Patient was hesitant about stopping the medication.  Plan -Refill sildenafil 20-40 mg once daily as needed

## 2016-12-13 NOTE — Progress Notes (Signed)
Internal Medicine Clinic Attending  Case discussed with Dr. Rathoreat the time of the visit. We reviewed the resident's history and exam and pertinent patient test results. I agree with the assessment, diagnosis, and plan of care documented in the resident's note.  

## 2017-03-25 ENCOUNTER — Encounter (HOSPITAL_COMMUNITY): Payer: Self-pay | Admitting: Emergency Medicine

## 2017-03-25 ENCOUNTER — Ambulatory Visit (HOSPITAL_COMMUNITY)
Admission: EM | Admit: 2017-03-25 | Discharge: 2017-03-25 | Disposition: A | Discharge status: Home / Self Care | Payer: BC Managed Care – PPO | Attending: Family Medicine | Admitting: Family Medicine

## 2017-03-25 DIAGNOSIS — M791 Myalgia: Secondary | ICD-10-CM | POA: Diagnosis not present

## 2017-03-25 DIAGNOSIS — M7918 Myalgia, other site: Secondary | ICD-10-CM

## 2017-03-25 DIAGNOSIS — G5703 Lesion of sciatic nerve, bilateral lower limbs: Secondary | ICD-10-CM

## 2017-03-25 MED ORDER — PREDNISONE 10 MG (21) PO TBPK: ORAL_TABLET | Freq: Every day | ORAL | 0 refills | Status: DC

## 2017-03-25 NOTE — ED Provider Notes (Signed)
Anthony Campos   [id number] 03/25/17 Arrival Time: 1740  ASSESSMENT & PLAN:  1. Bilateral buttock pain   2. Piriformis syndrome of both sides     Meds ordered this encounter  Medications  . predniSONE (STERAPRED UNI-PAK 21 TAB) 10 MG (21) TBPK tablet    Sig: Take by mouth daily. Take as directed.    Dispense:  21 tablet    Refill:  0   Trial of prednisone. He may continue Aleve with food. If not improving will schedule f/u with his PCP.  Reviewed expectations re: course of current medical issues. Questions answered. Outlined signs and symptoms indicating need for more acute intervention. Patient verbalized understanding. After Visit Summary given.   SUBJECTIVE:  Anthony Campos is a 61 y.o. male who presents with complaint of sporadic bilateral buttock pain that radiates to posterior thigh. Gradual onset over the past week. No fall or injury. No h/o similar in the past. Prolonged sitting exacerbates though he is able to sleep through the night without problem. No LE numbness or weakness reported. No LBP. Normal bowel/bladder habits. OTC Aleve BID with some help. No abdominal symptoms.  ROS: As per HPI.   OBJECTIVE:  Vitals:   03/25/17 1431 03/25/17 1432  BP:  118/80  Pulse:  91  Resp:  16  Temp:  98.5 F (36.9 C)  TempSrc:  Oral  SpO2:  98%  Weight: 227 lb (103 kg)   Height: 6\' 2"  (1.88 m)     General appearance: alert; no distress Abdomen: soft, non-tender Back: no paraspinal tenderness; FROM at hips Buttocks: no tenderness to palpation but points to center of both buttocks (where pain initiates); no swelling Extremities: no cyanosis or edema; symmetrical with no gross deformities Skin: warm and dry Neurologic: normal gait; normal symmetric reflexes in bilateral lower extremities; normal strength Psychological: alert and cooperative; normal mood and affect  No Known Allergies  Past Medical History:  Diagnosis Date  . Erectile dysfunction   .  Hyperlipidemia   . Hypertension   . Infection of skin and subcutaneous tissue   . Lipoma   . Tobacco abuse    Social History   Social History  . Marital status: Married    Spouse name: N/A  . Number of children: N/A  . Years of education: N/A   Occupational History  . Not on file.   Social History Main Topics  . Smoking status: Current Every Day Smoker    Packs/day: 0.25    Types: Cigarettes  . Smokeless tobacco: Never Used     Comment:  has cut back- 3 cigs a day   . Alcohol use 0.0 oz/week     Comment: Beer - rarely.- holidays like thanksgiving and x mas   . Drug use: No  . Sexual activity: Not on file   Other Topics Concern  . Not on file   Social History Narrative   Occupation- Radiation protection practitioner.   Lives with his wife. Has adult children and three grandchildren.   Family History  Problem Relation Age of Onset  . Depression Sister   . Heart disease Neg Hx   . Hypertension Neg Hx   . Stroke Neg Hx   . Colon polyps Neg Hx   . Colon cancer Neg Hx   . Rectal cancer Neg Hx   . Stomach cancer Neg Hx   . Esophageal cancer Neg Hx    Past Surgical History:  Procedure Laterality Date  . COLONOSCOPY     01-2006  .  HERNIA REPAIR  2005   Right hernia repair      Vanessa Kick, MD 03/25/17 [phone]

## 2017-03-25 NOTE — ED Triage Notes (Signed)
PT reports pain in both sides of buttocks with shooting pain down both legs. PT reports this has been going on for 1 week.

## 2017-04-01 ENCOUNTER — Ambulatory Visit (HOSPITAL_COMMUNITY)
Admission: EM | Admit: 2017-04-01 | Discharge: 2017-04-01 | Disposition: A | Discharge status: Home / Self Care | Payer: BC Managed Care – PPO | Attending: Family Medicine | Admitting: Family Medicine

## 2017-04-01 ENCOUNTER — Encounter (HOSPITAL_COMMUNITY): Payer: Self-pay | Admitting: Emergency Medicine

## 2017-04-01 DIAGNOSIS — M5430 Sciatica, unspecified side: Secondary | ICD-10-CM | POA: Diagnosis not present

## 2017-04-01 MED ORDER — HYDROCODONE-ACETAMINOPHEN 5-325 MG PO TABS: 1.0000 | ORAL_TABLET | Freq: Four times a day (QID) | ORAL | 0 refills | Status: DC | PRN

## 2017-04-01 NOTE — ED Provider Notes (Signed)
[facility]   [id number] 04/01/17 Arrival Time: 1901   SUBJECTIVE:  Anthony Campos is a 61 y.o. male who presents to the urgent care with complaint of muscle spasms in the butt and goes to my legs this started 3 weeks ago. Here last Wed for the same problem and was thought to have piriformis syndrome.  Patient has no history of injury. The pain he is experiencing is in both of his buttocks and radiates down the legs. He's having no weakness or difficulty with urination or bowel movements. He has no numbness in his legs.  He works Theatre manager at [employer]  Past Medical History:  Diagnosis Date  . Erectile dysfunction   . Hyperlipidemia   . Hypertension   . Infection of skin and subcutaneous tissue   . Lipoma   . Tobacco abuse    Family History  Problem Relation Age of Onset  . Depression Sister   . Heart disease Neg Hx   . Hypertension Neg Hx   . Stroke Neg Hx   . Colon polyps Neg Hx   . Colon cancer Neg Hx   . Rectal cancer Neg Hx   . Stomach cancer Neg Hx   . Esophageal cancer Neg Hx    Social History   Social History  . Marital status: Married    Spouse name: N/A  . Number of children: N/A  . Years of education: N/A   Occupational History  . Not on file.   Social History Main Topics  . Smoking status: Current Every Day Smoker    Packs/day: 0.25    Types: Cigarettes  . Smokeless tobacco: Never Used     Comment:  has cut back- 3 cigs a day   . Alcohol use 0.0 oz/week     Comment: Beer - rarely.- holidays like thanksgiving and x mas   . Drug use: No  . Sexual activity: Not on file   Other Topics Concern  . Not on file   Social History Narrative   Occupation- Radiation protection practitioner.   Lives with his wife. Has adult children and three grandchildren.   No outpatient prescriptions have been marked as taking for the 04/01/17 encounter [facility] Encounter).   No Known Allergies    ROS: As per HPI,  remainder of ROS negative.   OBJECTIVE:   Vitals:   04/01/17 1921 04/01/17 1922  BP: 131/88   Pulse: 81   Resp: 17   Temp: 98.9 F (37.2 C)   TempSrc: Oral   SpO2: 96%   Weight:  227 lb (103 kg)  Height:  6\' 2"  (1.88 m)     General appearance: alert; no distress Eyes: PERRL; EOMI; conjunctiva normal HENT: normocephalic; atraumatic; TMs normal, canal normal, external ears normal without trauma; nasal mucosa normal; oral mucosa normal Neck: supple Abdomen: soft, non-tender; bowel sounds normal; no masses or organomegaly; no guarding or rebound tenderness Back: no CVA tenderness Extremities: no cyanosis or edema; symmetrical with no gross deformities; full range of motion of both legs although he does have crossover pain with straight leg raising on either leg at 30 Skin: warm and dry Neurologic: normal gait; grossly normal Psychological: alert and cooperative; normal mood and affect      Labs:  Results for orders placed or performed in visit on 12/10/16  BMP8+Anion Gap  Result Value Ref Range   Glucose 117 (H) 65 - 99 mg/dL   BUN 19 8 - 27 mg/dL   Creatinine,  Ser 0.83 0.76 - 1.27 mg/dL   GFR calc non Af Amer 95 >59 mL/min/1.73   GFR calc Af Amer 110 >59 mL/min/1.73   BUN/Creatinine Ratio 23 10 - 24   Sodium 142 134 - 144 mmol/L   Potassium 4.0 3.5 - 5.2 mmol/L   Chloride 104 96 - 106 mmol/L   CO2 29 18 - 29 mmol/L   Anion Gap 9.0 (L) 10.0 - 18.0 mmol/L   Calcium 8.9 8.6 - 10.2 mg/dL  Lipid Profile  Result Value Ref Range   Cholesterol, Total 134 100 - 199 mg/dL   Triglycerides 83 0 - 149 mg/dL   HDL 45 >39 mg/dL   VLDL Cholesterol Cal 17 5 - 40 mg/dL   LDL Calculated 72 0 - 99 mg/dL   Chol/HDL Ratio 3.0 0.0 - 5.0 ratio    Labs Reviewed - No data to display  No results found.     ASSESSMENT & PLAN:  1. Sciatica, unspecified laterality     Meds ordered this encounter  Medications  . HYDROcodone-acetaminophen (NORCO) 5-325 MG tablet    Sig: Take  1 tablet by mouth every 6 (six) hours as needed for moderate pain.    Dispense:  12 tablet    Refill:  0    Reviewed expectations re: course of current medical issues. Questions answered. Outlined signs and symptoms indicating need for more acute intervention. Patient verbalized understanding. After Visit Summary given.    Procedures:      Robyn Haber, MD 04/01/17 2014

## 2017-04-01 NOTE — ED Triage Notes (Signed)
Pt. Stated, I've had muscle spasms in the butt and goes to my legs this started 3 weeks ago. Here last Wed for the same problem

## 2017-04-09 ENCOUNTER — Encounter (HOSPITAL_COMMUNITY): Payer: Self-pay | Admitting: *Deleted

## 2017-04-09 ENCOUNTER — Emergency Department (HOSPITAL_COMMUNITY)
Admission: EM | Admit: 2017-04-09 | Discharge: 2017-04-09 | Disposition: A | Discharge status: Home / Self Care | Payer: BC Managed Care – PPO | Attending: Emergency Medicine | Admitting: Emergency Medicine

## 2017-04-09 DIAGNOSIS — F17210 Nicotine dependence, cigarettes, uncomplicated: Secondary | ICD-10-CM | POA: Insufficient documentation

## 2017-04-09 DIAGNOSIS — M5431 Sciatica, right side: Secondary | ICD-10-CM | POA: Diagnosis present

## 2017-04-09 DIAGNOSIS — M5430 Sciatica, unspecified side: Secondary | ICD-10-CM

## 2017-04-09 DIAGNOSIS — I10 Essential (primary) hypertension: Secondary | ICD-10-CM | POA: Insufficient documentation

## 2017-04-09 DIAGNOSIS — M5432 Sciatica, left side: Secondary | ICD-10-CM | POA: Insufficient documentation

## 2017-04-09 DIAGNOSIS — Z79899 Other long term (current) drug therapy: Secondary | ICD-10-CM | POA: Diagnosis not present

## 2017-04-09 MED ORDER — OXYCODONE-ACETAMINOPHEN 5-325 MG PO TABS: 2.0000 | ORAL_TABLET | ORAL | 0 refills | Status: DC | PRN

## 2017-04-09 MED ORDER — DIAZEPAM 2 MG PO TABS: 2.0000 mg | ORAL_TABLET | Freq: Four times a day (QID) | ORAL | 0 refills | Status: DC | PRN

## 2017-04-09 MED ORDER — DIAZEPAM 5 MG PO TABS
5.0000 mg | ORAL_TABLET | Freq: Once | ORAL | Status: AC
  Administered 2017-04-09: 5 mg via ORAL
  Filled 2017-04-09: qty 1

## 2017-04-09 MED ORDER — OXYCODONE-ACETAMINOPHEN 5-325 MG PO TABS
2.0000 | ORAL_TABLET | Freq: Once | ORAL | Status: AC
  Administered 2017-04-09: 2 via ORAL
  Filled 2017-04-09: qty 2

## 2017-04-09 NOTE — ED Provider Notes (Signed)
[facility] DEPT Provider Note   CSN: [id number] Arrival date & time: 04/09/17  1051     History   Chief Complaint Chief Complaint  Patient presents with  . Back Pain    HPI Anthony Campos is a 61 y.o. male.  61 year old male presents with three-week history of bilateral sciatic pain that is worse with sitting. Has been seen at urgent care 2 for this and is scheduled to see orthopedics in 2 days. He denies any bowel or bladder dysfunction. Denies any back pain. No numbness to his perineum. Denies any foot drop when walking. Is currently on a steroid taper and has been prescribed Percocet which she has run out of. No recent history of trauma. Pain better with remaining still      Past Medical History:  Diagnosis Date  . Erectile dysfunction   . Hyperlipidemia   . Hypertension   . Infection of skin and subcutaneous tissue   . Lipoma   . Tobacco abuse     Patient Active Problem List   Diagnosis Date Noted  . Insect bite 12/10/2016  . Healthcare maintenance 02/16/2015  . TOBACCO ABUSE 12/22/2009  . Essential hypertension, benign 10/20/2008  . ERECTILE DYSFUNCTION 07/24/2006  . Hyperlipidemia, mild 05/20/2006    Past Surgical History:  Procedure Laterality Date  . COLONOSCOPY     01-2006  . HERNIA REPAIR  2005   Right hernia repair        Home Medications    Prior to Admission medications   Medication Sig Start Date End Date Taking? Authorizing Provider  atorvastatin (LIPITOR) 40 MG tablet TAKE 1 TABLET BY MOUTH EVERY DAY 12/10/16   Shela Leff, MD  Cetirizine HCl (ZYRTEC ALLERGY) 10 MG CAPS Take 1 capsule (10 mg total) by mouth daily. 12/10/16   Shela Leff, MD  hydrochlorothiazide (HYDRODIURIL) 12.5 MG tablet TAKE 1 TABLET BY MOUTH EVERY DAY 06/19/16   Shela Leff, MD  HYDROcodone-acetaminophen (NORCO) 5-325 MG tablet Take 1 tablet by mouth every 6 (six) hours as needed for moderate pain. 04/01/17   Robyn Haber, MD  sildenafil (REVATIO)  20 MG tablet Take 1-2 tablets once daily as needed. 12/10/16   Shela Leff, MD    Family History Family History  Problem Relation Age of Onset  . Depression Sister   . Heart disease Neg Hx   . Hypertension Neg Hx   . Stroke Neg Hx   . Colon polyps Neg Hx   . Colon cancer Neg Hx   . Rectal cancer Neg Hx   . Stomach cancer Neg Hx   . Esophageal cancer Neg Hx     Social History Social History  Substance Use Topics  . Smoking status: Current Every Day Smoker    Packs/day: 0.25    Types: Cigarettes  . Smokeless tobacco: Never Used     Comment:  has cut back- 3 cigs a day   . Alcohol use 0.0 oz/week     Comment: Beer - rarely.- holidays like thanksgiving and x mas      Allergies   Patient has no known allergies.   Review of Systems Review of Systems  All other systems reviewed and are negative.    Physical Exam Updated Vital Signs BP (!) 128/97 (BP Location: Right Arm)   Pulse 93   Temp 97.8 F (36.6 C) (Oral)   Resp 16   SpO2 98%   Physical Exam  Constitutional: He is oriented to person, place, and time. He appears well-developed and well-nourished.  Non-toxic appearance. No distress.  HENT:  Head: Normocephalic and atraumatic.  Eyes: Pupils are equal, round, and reactive to light. Conjunctivae, EOM and lids are normal.  Neck: Normal range of motion. Neck supple. No tracheal deviation present. No thyroid mass present.  Cardiovascular: Normal rate, regular rhythm and normal heart sounds.  Exam reveals no gallop.   No murmur heard. Pulmonary/Chest: Effort normal and breath sounds normal. No stridor. No respiratory distress. He has no decreased breath sounds. He has no wheezes. He has no rhonchi. He has no rales.  Abdominal: Soft. Normal appearance and bowel sounds are normal. He exhibits no distension. There is no tenderness. There is no rebound and no CVA tenderness.  Musculoskeletal: Normal range of motion. He exhibits no edema or tenderness.        Legs: Neurological: He is alert and oriented to person, place, and time. He has normal strength. No cranial nerve deficit or sensory deficit. GCS eye subscore is 4. GCS verbal subscore is 5. GCS motor subscore is 6.  Reflex Scores:      Patellar reflexes are 3+ on the right side and 1+ on the left side. No foot drop when walking.  Skin: Skin is warm and dry. No abrasion and no rash noted.  Psychiatric: He has a normal mood and affect. His speech is normal and behavior is normal.  Nursing note and vitals reviewed.    ED Treatments / Results  Labs (all labs ordered are listed, but only abnormal results are displayed) Labs Reviewed - No data to display  EKG  EKG Interpretation None       Radiology No results found.  Procedures Procedures (including critical care time)  Medications Ordered in ED Medications - No data to display   Initial Impression / Assessment and Plan / ED Course  I have reviewed the triage vital signs and the nursing notes.  Pertinent labs & imaging results that were available during my care of the patient were reviewed by me and considered in my medical decision making (see chart for details).    Patient symptoms consistent with musculoskeletal etiology. Do not think that this represents a spinal cord etiology. Will prescribe patient a short course of opiates as well as muscle relaxant and encourage him to follow-up with the orthopedist in 2 days. Return precautions given  Final Clinical Impressions(s) / ED Diagnoses   Final diagnoses:  None    New Prescriptions New Prescriptions   No medications on file     Lacretia Leigh, MD 04/09/17 1320

## 2017-04-09 NOTE — ED Triage Notes (Signed)
To ED for further eval of lower back pain with radiation down legs. States he is having muscle spasms. Has been seen 2 times by [facility] and given steriods which are not helping. Pt walks with sever limp since pain started. States he has numbness intermittently to right lower leg. No difficulty with urination or defication. 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

## 2017-06-02 ENCOUNTER — Telehealth: Payer: Self-pay | Admitting: Vascular Surgery

## 2017-06-02 NOTE — Telephone Encounter (Signed)
Sched appt 06/24/17 at 9:30. Spoke to pt and wife.

## 2017-06-02 NOTE — Telephone Encounter (Signed)
-----   Message from Willy Eddy, RN sent at 05/28/2017  2:55 PM EST ----- Regarding: Appointment needed  Please make office appointment for this patient with Dr. Donnetta Hutching /[facility]  Surgery date is for 07/23/17. Please call patient with date and time.  Thank you [employer]

## 2017-06-19 ENCOUNTER — Other Ambulatory Visit: Payer: Self-pay | Admitting: Internal Medicine

## 2017-06-23 NOTE — Telephone Encounter (Signed)
hydrochlorothiazide (HYDRODIURIL) 12.5 MG tablet, Refill request @ [facility] on [address].

## 2017-06-24 ENCOUNTER — Encounter: Payer: Self-pay | Admitting: Vascular Surgery

## 2017-06-24 ENCOUNTER — Ambulatory Visit: Payer: BC Managed Care – PPO | Admitting: Vascular Surgery

## 2017-06-24 VITALS — BP 171/99 | HR 90 | Temp 98.3°F | Resp 20 | Ht 74.0 in | Wt 234.0 lb

## 2017-06-24 DIAGNOSIS — M5137 Other intervertebral disc degeneration, lumbosacral region: Secondary | ICD-10-CM | POA: Diagnosis not present

## 2017-06-24 NOTE — Progress Notes (Signed)
[facility] Anthony Campos  Patient name: Anthony Campos MRN: [id number] DOB: 1955-11-13 Sex: male  REASON FOR CONSULT: Discuss exposure for anterior interbody fusion  HPI: [name] is a 61 y.o. male, who is referred by Dr.Dumonski for evaluation of anterior exposure for interbody fusion.  He is here today with his wife.  He has severe back pain and has had difficulty with pain radiating into both lower extremities.  He is failed conservative treatment and is been recommended to undergo multilevel fusion by Dr. Lynann Bologna.  He is here today for discussion of anterior approach.  He denies any past cardiac disease.  He denies any history of stroke or peripheral vascular occlusive disease.  His only prior surgery is right inguinal hernia repair approximately 10 years ago.  No intra-abdominal surgery.  Past Medical History:  Diagnosis Date  . Erectile dysfunction   . Hyperlipidemia   . Hypertension   . Infection of skin and subcutaneous tissue   . Lipoma   . Tobacco abuse     Family History  Problem Relation Age of Onset  . Depression Sister   . Heart disease Neg Hx   . Hypertension Neg Hx   . Stroke Neg Hx   . Colon polyps Neg Hx   . Colon cancer Neg Hx   . Rectal cancer Neg Hx   . Stomach cancer Neg Hx   . Esophageal cancer Neg Hx     SOCIAL HISTORY: Social History   Socioeconomic History  . Marital status: Married    Spouse name: Not on file  . Number of children: Not on file  . Years of education: Not on file  . Highest education level: Not on file  Social Needs  . Financial resource strain: Not on file  . Food insecurity - worry: Not on file  . Food insecurity - inability: Not on file  . Transportation needs - medical: Not on file  . Transportation needs - non-medical: Not on file  Occupational History  . Not on file  Tobacco Use  . Smoking status: Current Every Day Smoker    Packs/day: 0.25    Types: Cigarettes    . Smokeless tobacco: Never Used  . Tobacco comment:  has cut back- 3 cigs a day   Substance and Sexual Activity  . Alcohol use: Yes    Alcohol/week: 0.0 oz    Comment: Beer - rarely.- holidays like thanksgiving and x mas   . Drug use: No  . Sexual activity: Not on file  Other Topics Concern  . Not on file  Social History Narrative   Occupation- Radiation protection practitioner.   Lives with his wife. Has adult children and three grandchildren.    No Known Allergies  Current Outpatient Medications  Medication Sig Dispense Refill  . atorvastatin (LIPITOR) 40 MG tablet TAKE 1 TABLET BY MOUTH EVERY DAY 90 tablet 3  . diazepam (VALIUM) 2 MG tablet Take 1 tablet (2 mg total) by mouth every 6 (six) hours as needed for muscle spasms. 15 tablet 0  . hydrochlorothiazide (HYDRODIURIL) 12.5 MG tablet TAKE 1 TABLET BY MOUTH EVERY DAY 90 tablet 3  . HYDROcodone-acetaminophen (NORCO) 5-325 MG tablet Take 1 tablet by mouth every 6 (six) hours as needed for moderate pain. 12 tablet 0  . Meloxicam (MOBIC PO) Take by mouth.    . sildenafil (REVATIO) 20 MG tablet Take 1-2 tablets once daily as needed. 30 tablet 0   No current facility-administered medications for this  visit.     REVIEW OF SYSTEMS:  [X]  denotes positive finding, [ ]  denotes negative finding Cardiac  Comments:  Chest pain or chest pressure:    Shortness of breath upon exertion:    Short of breath when lying flat:    Irregular heart rhythm:        Vascular    Pain in calf, thigh, or hip brought on by ambulation: x  neurogenic  Pain in feet at night that wakes you up from your sleep:     Blood clot in your veins:    Leg swelling:         Pulmonary    Oxygen at home:    Productive cough:     Wheezing:         Neurologic    Sudden weakness in arms or legs:     Sudden numbness in arms or legs:   x  Sudden onset of difficulty speaking or slurred speech:    Temporary loss of vision in one eye:     Problems with dizziness:          Gastrointestinal    Blood in stool:     Vomited blood:         Genitourinary    Burning when urinating:     Blood in urine:        Psychiatric    Major depression:         Hematologic    Bleeding problems:    Problems with blood clotting too easily:        Skin    Rashes or ulcers:        Constitutional    Fever or chills:      PHYSICAL EXAM: Vitals:   06/24/17 1010 06/24/17 1014  BP: (!) 162/102 (!) 171/99  Pulse: 90   Resp: 20   Temp: 98.3 F (36.8 C)   TempSrc: Oral   SpO2: 97%   Weight: 234 lb (106.1 kg)   Height: 6\' 2"  (1.88 m)     GENERAL: The patient is a well-nourished male, in no acute distress. The vital signs are documented above. CARDIOVASCULAR: Carotid arteries without bruits bilaterally.  2+ radial and 2+ dorsalis pedis pulses bilaterally PULMONARY: There is good air exchange  ABDOMEN: Soft and non-tender  MUSCULOSKELETAL: There are no major deformities or cyanosis. NEUROLOGIC: No focal weakness or paresthesias are detected. SKIN: There are no ulcers or rashes noted. PSYCHIATRIC: The patient has a normal affect.  DATA:  I reviewed his plain films of his spine and this shows no evidence of significant calcification of his aortoiliac segments.  MEDICAL ISSUES: I had a long discussion with the patient and his wife present regarding exposure.  Explained that Dr. Lynann Bologna feels appropriate treatment involves anterior fusion for L4-5 and L5-S1.  Explained the technical exposure with mobilization of the rectus muscle.  Also mobilization of intraperitoneal contents, left ureter and arterial venous structures overlying the spine.  Also explained the potential in the patient understands and wishes to proceed as scheduled.   Rosetta Posner, MD FACS [facility] Tel [phone] Pager [phone]

## 2017-06-27 ENCOUNTER — Other Ambulatory Visit: Payer: Self-pay | Admitting: *Deleted

## 2017-07-10 ENCOUNTER — Other Ambulatory Visit: Payer: Self-pay | Admitting: *Deleted

## 2017-07-10 ENCOUNTER — Other Ambulatory Visit: Payer: Self-pay | Admitting: Orthopedic Surgery

## 2017-07-18 NOTE — Pre-Procedure Instructions (Signed)
Anthony Campos  07/18/2017      [facility] [address] [place] [address] [place] [address] Phone: [phone] Fax: [phone]    Your procedure is scheduled on 07/23/2017.  Report to [facility] Admitting at [place].M.  Call this number if you have problems the morning of surgery:  [phone]   Remember:  Do not eat food or drink liquids after midnight.   Continue all medications as directed by your physician except follow these medication instructions before surgery below   Take these medicines the morning of surgery with A SIP OF WATER: Gabapentin (Neurontin) Hydrocodone-acetaminophen (Norco) - if needed for pain Tizanidine (Zanaflex) - if needed for muscle spasms  7 days prior to surgery STOP taking any Aspirin (unless otherwise instructed by your surgeon), Aleve, Naproxen, Ibuprofen, Motrin, Advil, Goody's, BC's, all herbal medications, fish oil, and all vitamins    Do not wear jewelry.  Do not wear lotions, powders, or colognes, or deodorant.  Men may shave face and neck.  Do not bring valuables to the hospital.  [facility] is not responsible for any belongings or valuables.  Hearing aids, eyeglasses, contacts, dentures or bridgework may not be worn into surgery.  Leave your suitcase in the car.  After surgery it may be brought to your room.  For patients admitted to the hospital, discharge time will be determined by your treatment team.  Patients discharged the day of surgery will not be allowed to drive home.   Name and phone number of your driver:    Special instructions:   [facility]- Preparing For Surgery  Before surgery, you can play an important role. Because skin is not sterile, your skin needs to be as free of germs as possible. You can reduce the number of germs on your skin by washing with CHG (chlorahexidine gluconate) Soap before surgery.  CHG is an  antiseptic cleaner which kills germs and bonds with the skin to continue killing germs even after washing.  Please do not use if you have an allergy to CHG or antibacterial soaps. If your skin becomes reddened/irritated stop using the CHG.  Do not shave (including legs and underarms) for at least 48 hours prior to first CHG shower. It is OK to shave your face.  Please follow these instructions carefully.   1. Shower the NIGHT BEFORE SURGERY and the MORNING OF SURGERY with CHG.   2. If you chose to wash your hair, wash your hair first as usual with your normal shampoo.  3. After you shampoo, rinse your hair and body thoroughly to remove the shampoo.  4. Use CHG as you would any other liquid soap. You can apply CHG directly to the skin and wash gently with a scrungie or a clean washcloth.   5. Apply the CHG Soap to your body ONLY FROM THE NECK DOWN.  Do not use on open wounds or open sores. Avoid contact with your eyes, ears, mouth and genitals (private parts). Wash Face and genitals (private parts)  with your normal soap.  6. Wash thoroughly, paying special attention to the area where your surgery will be performed.  7. Thoroughly rinse your body with warm water from the neck down.  8. DO NOT shower/wash with your normal soap after using and rinsing off the CHG Soap.  9. Pat yourself dry with a CLEAN TOWEL.  10. Wear CLEAN  PAJAMAS to bed the night before surgery, wear comfortable clothes the morning of surgery  11. Place CLEAN SHEETS on your bed the night of your first shower and DO NOT SLEEP WITH PETS.    Day of Surgery: Shower as stated above. Do not apply any deodorants/lotions. Please wear clean clothes to the hospital/surgery center.      Please read over the following fact sheets that you were given.

## 2017-07-21 ENCOUNTER — Other Ambulatory Visit: Payer: Self-pay

## 2017-07-21 ENCOUNTER — Ambulatory Visit (HOSPITAL_COMMUNITY): Admission: RE | Admit: 2017-07-21 | Payer: BC Managed Care – PPO | Source: Ambulatory Visit

## 2017-07-21 ENCOUNTER — Encounter (HOSPITAL_COMMUNITY)
Admission: RE | Admit: 2017-07-21 | Discharge: 2017-07-21 | Disposition: A | Discharge status: Home / Self Care | Payer: BC Managed Care – PPO | Source: Ambulatory Visit | Attending: Orthopedic Surgery | Admitting: Orthopedic Surgery

## 2017-07-21 ENCOUNTER — Encounter (HOSPITAL_COMMUNITY): Payer: Self-pay

## 2017-07-21 DIAGNOSIS — M5137 Other intervertebral disc degeneration, lumbosacral region: Secondary | ICD-10-CM | POA: Insufficient documentation

## 2017-07-21 DIAGNOSIS — Z01818 Encounter for other preprocedural examination: Secondary | ICD-10-CM

## 2017-07-21 DIAGNOSIS — I10 Essential (primary) hypertension: Secondary | ICD-10-CM

## 2017-07-21 DIAGNOSIS — R9431 Abnormal electrocardiogram [ECG] [EKG]: Secondary | ICD-10-CM | POA: Insufficient documentation

## 2017-07-21 LAB — CBC WITH DIFFERENTIAL/PLATELET
Basophils Absolute: 0 10*3/uL (ref 0.0–0.1)
Basophils Relative: 0 %
Eosinophils Absolute: 0.3 10*3/uL (ref 0.0–0.7)
Eosinophils Relative: 4 %
HEMATOCRIT: 41.3 % (ref 39.0–52.0)
HEMOGLOBIN: 13.9 g/dL (ref 13.0–17.0)
LYMPHS ABS: 1.3 10*3/uL (ref 0.7–4.0)
Lymphocytes Relative: 19 %
MCH: 30.1 pg (ref 26.0–34.0)
MCHC: 33.7 g/dL (ref 30.0–36.0)
MCV: 89.4 fL (ref 78.0–100.0)
MONO ABS: 0.9 10*3/uL (ref 0.1–1.0)
MONOS PCT: 14 %
NEUTROS ABS: 4.1 10*3/uL (ref 1.7–7.7)
Neutrophils Relative %: 63 %
Platelets: 208 10*3/uL (ref 150–400)
RBC: 4.62 MIL/uL (ref 4.22–5.81)
RDW: 14.2 % (ref 11.5–15.5)
WBC: 6.6 10*3/uL (ref 4.0–10.5)

## 2017-07-21 LAB — COMPREHENSIVE METABOLIC PANEL
ALBUMIN: 3.8 g/dL (ref 3.5–5.0)
ALK PHOS: 74 U/L (ref 38–126)
ALT: 19 U/L (ref 17–63)
ANION GAP: 10 (ref 5–15)
AST: 22 U/L (ref 15–41)
BILIRUBIN TOTAL: 0.6 mg/dL (ref 0.3–1.2)
BUN: 20 mg/dL (ref 6–20)
CALCIUM: 9.1 mg/dL (ref 8.9–10.3)
CO2: 24 mmol/L (ref 22–32)
Chloride: 104 mmol/L (ref 101–111)
Creatinine, Ser: 1.01 mg/dL (ref 0.61–1.24)
Glucose, Bld: 106 mg/dL — ABNORMAL HIGH (ref 65–99)
POTASSIUM: 4.1 mmol/L (ref 3.5–5.1)
Sodium: 138 mmol/L (ref 135–145)
TOTAL PROTEIN: 6.8 g/dL (ref 6.5–8.1)

## 2017-07-21 LAB — URINALYSIS, ROUTINE W REFLEX MICROSCOPIC
BACTERIA UA: NONE SEEN
BILIRUBIN URINE: NEGATIVE
Glucose, UA: NEGATIVE mg/dL
KETONES UR: NEGATIVE mg/dL
Nitrite: NEGATIVE
PH: 5 (ref 5.0–8.0)
Protein, ur: NEGATIVE mg/dL
SPECIFIC GRAVITY, URINE: 1.018 (ref 1.005–1.030)

## 2017-07-21 LAB — TYPE AND SCREEN
ABO/RH(D): O POS
ANTIBODY SCREEN: NEGATIVE

## 2017-07-21 LAB — SURGICAL PCR SCREEN
MRSA, PCR: NEGATIVE
STAPHYLOCOCCUS AUREUS: NEGATIVE

## 2017-07-21 LAB — PROTIME-INR
INR: 0.97
PROTHROMBIN TIME: 12.7 s (ref 11.4–15.2)

## 2017-07-21 LAB — ABO/RH: ABO/RH(D): O POS

## 2017-07-21 LAB — APTT: aPTT: 30 seconds (ref 24–36)

## 2017-07-21 NOTE — Progress Notes (Signed)
Patient left pre-op testing before getting chest xray Patient will need chest xray the morning of surgery

## 2017-07-21 NOTE — Progress Notes (Signed)
PCP - Shela Leff Cardiologist - denies  Chest x-ray - 07/21/17 EKG - 07/21/17 Stress Test - denies ECHO - denies Cardiac Cath - denies   Anesthesia review: NO  Patient denies shortness of breath, fever, cough and chest pain at PAT appointment   Patient verbalized understanding of instructions that were given to them at the PAT appointment. Patient was also instructed that they will need to review over the PAT instructions again at home before surgery.

## 2017-07-23 ENCOUNTER — Other Ambulatory Visit: Payer: Self-pay

## 2017-07-23 ENCOUNTER — Inpatient Hospital Stay (HOSPITAL_COMMUNITY): Payer: BC Managed Care – PPO

## 2017-07-23 ENCOUNTER — Inpatient Hospital Stay (HOSPITAL_COMMUNITY): Payer: BC Managed Care – PPO | Admitting: Certified Registered Nurse Anesthetist

## 2017-07-23 ENCOUNTER — Encounter (HOSPITAL_COMMUNITY): Payer: Self-pay | Admitting: Surgery

## 2017-07-23 ENCOUNTER — Inpatient Hospital Stay (HOSPITAL_COMMUNITY)
Admission: RE | Disposition: A | Discharge status: Home / Self Care | Payer: Self-pay | Source: Ambulatory Visit | Attending: Orthopedic Surgery

## 2017-07-23 ENCOUNTER — Inpatient Hospital Stay (HOSPITAL_COMMUNITY)
Admission: RE | Admit: 2017-07-23 | Discharge: 2017-07-26 | DRG: 455 | Disposition: A | Discharge status: Home / Self Care | Payer: BC Managed Care – PPO | Source: Ambulatory Visit | Attending: Orthopedic Surgery | Admitting: Orthopedic Surgery

## 2017-07-23 DIAGNOSIS — I10 Essential (primary) hypertension: Secondary | ICD-10-CM | POA: Diagnosis present

## 2017-07-23 DIAGNOSIS — M48061 Spinal stenosis, lumbar region without neurogenic claudication: Secondary | ICD-10-CM | POA: Diagnosis present

## 2017-07-23 DIAGNOSIS — E785 Hyperlipidemia, unspecified: Secondary | ICD-10-CM | POA: Diagnosis present

## 2017-07-23 DIAGNOSIS — Z79899 Other long term (current) drug therapy: Secondary | ICD-10-CM

## 2017-07-23 DIAGNOSIS — Z01818 Encounter for other preprocedural examination: Secondary | ICD-10-CM

## 2017-07-23 DIAGNOSIS — Z87891 Personal history of nicotine dependence: Secondary | ICD-10-CM | POA: Diagnosis not present

## 2017-07-23 DIAGNOSIS — M5137 Other intervertebral disc degeneration, lumbosacral region: Secondary | ICD-10-CM | POA: Diagnosis not present

## 2017-07-23 DIAGNOSIS — M5136 Other intervertebral disc degeneration, lumbar region: Secondary | ICD-10-CM | POA: Diagnosis not present

## 2017-07-23 DIAGNOSIS — M4186 Other forms of scoliosis, lumbar region: Secondary | ICD-10-CM | POA: Diagnosis present

## 2017-07-23 DIAGNOSIS — N35919 Unspecified urethral stricture, male, unspecified site: Secondary | ICD-10-CM | POA: Diagnosis present

## 2017-07-23 DIAGNOSIS — M5417 Radiculopathy, lumbosacral region: Secondary | ICD-10-CM | POA: Diagnosis present

## 2017-07-23 DIAGNOSIS — Z419 Encounter for procedure for purposes other than remedying health state, unspecified: Secondary | ICD-10-CM

## 2017-07-23 DIAGNOSIS — M5416 Radiculopathy, lumbar region: Secondary | ICD-10-CM | POA: Diagnosis present

## 2017-07-23 DIAGNOSIS — M5410 Radiculopathy, site unspecified: Secondary | ICD-10-CM | POA: Diagnosis present

## 2017-07-23 HISTORY — PX: CYSTOSCOPY: SHX5120

## 2017-07-23 HISTORY — PX: ANTERIOR LAT LUMBAR FUSION: SHX1168

## 2017-07-23 HISTORY — PX: INSERTION OF SUPRAPUBIC CATHETER: SHX5870

## 2017-07-23 HISTORY — PX: ABDOMINAL EXPOSURE: SHX5708

## 2017-07-23 HISTORY — PX: ANTERIOR LUMBAR FUSION: SHX1170

## 2017-07-23 SURGERY — ANTERIOR LUMBAR FUSION 2 LEVELS
Anesthesia: General | Site: Bladder | Laterality: Right

## 2017-07-23 MED ORDER — MEPERIDINE HCL 25 MG/ML IJ SOLN: 6.2500 mg | INTRAMUSCULAR | Status: DC | PRN

## 2017-07-23 MED ORDER — DEXAMETHASONE SODIUM PHOSPHATE 4 MG/ML IJ SOLN
INTRAMUSCULAR | Status: DC | PRN
  Administered 2017-07-23: 10 mg via INTRAVENOUS

## 2017-07-23 MED ORDER — SENNOSIDES-DOCUSATE SODIUM 8.6-50 MG PO TABS: 1.0000 | ORAL_TABLET | Freq: Every evening | ORAL | Status: DC | PRN

## 2017-07-23 MED ORDER — PROMETHAZINE HCL 25 MG/ML IJ SOLN: 6.2500 mg | INTRAMUSCULAR | Status: DC | PRN

## 2017-07-23 MED ORDER — LACTATED RINGERS IV SOLN
INTRAVENOUS | Status: DC | PRN
  Administered 2017-07-23 (×3): via INTRAVENOUS

## 2017-07-23 MED ORDER — DIAZEPAM 5 MG PO TABS
5.0000 mg | ORAL_TABLET | Freq: Four times a day (QID) | ORAL | Status: DC | PRN
  Administered 2017-07-23: 5 mg via ORAL
  Filled 2017-07-23: qty 1

## 2017-07-23 MED ORDER — BISACODYL 5 MG PO TBEC: 5.0000 mg | DELAYED_RELEASE_TABLET | Freq: Every day | ORAL | Status: DC | PRN

## 2017-07-23 MED ORDER — SODIUM CHLORIDE 0.9% FLUSH
3.0000 mL | Freq: Two times a day (BID) | INTRAVENOUS | Status: DC
  Administered 2017-07-23: 3 mL via INTRAVENOUS

## 2017-07-23 MED ORDER — PROPOFOL 500 MG/50ML IV EMUL
INTRAVENOUS | Status: DC | PRN
  Administered 2017-07-23: 50 ug/kg/min via INTRAVENOUS

## 2017-07-23 MED ORDER — MIDAZOLAM HCL 5 MG/5ML IJ SOLN
INTRAMUSCULAR | Status: DC | PRN
  Administered 2017-07-23: 2 mg via INTRAVENOUS

## 2017-07-23 MED ORDER — ATORVASTATIN CALCIUM 20 MG PO TABS
40.0000 mg | ORAL_TABLET | Freq: Every day | ORAL | Status: DC
  Administered 2017-07-25 – 2017-07-26 (×2): 40 mg via ORAL
  Filled 2017-07-23 (×2): qty 2

## 2017-07-23 MED ORDER — HYDROCHLOROTHIAZIDE 25 MG PO TABS
12.5000 mg | ORAL_TABLET | Freq: Every day | ORAL | Status: DC
  Administered 2017-07-25 – 2017-07-26 (×2): 12.5 mg via ORAL
  Filled 2017-07-23 (×2): qty 1

## 2017-07-23 MED ORDER — THROMBIN 20000 UNITS EX SOLR
CUTANEOUS | Status: AC
  Filled 2017-07-23: qty 20000

## 2017-07-23 MED ORDER — KETAMINE HCL-SODIUM CHLORIDE 100-0.9 MG/10ML-% IV SOSY
PREFILLED_SYRINGE | INTRAVENOUS | Status: AC
  Filled 2017-07-23: qty 10

## 2017-07-23 MED ORDER — MENTHOL 3 MG MT LOZG: 1.0000 | LOZENGE | OROMUCOSAL | Status: DC | PRN

## 2017-07-23 MED ORDER — ACETAMINOPHEN 10 MG/ML IV SOLN: 1000.0000 mg | Freq: Once | INTRAVENOUS | Status: DC | PRN

## 2017-07-23 MED ORDER — HYDROCODONE-ACETAMINOPHEN 7.5-325 MG PO TABS: 1.0000 | ORAL_TABLET | Freq: Once | ORAL | Status: DC | PRN

## 2017-07-23 MED ORDER — HYDROMORPHONE HCL 1 MG/ML IJ SOLN
0.2500 mg | INTRAMUSCULAR | Status: DC | PRN
  Administered 2017-07-23: 0.5 mg via INTRAVENOUS

## 2017-07-23 MED ORDER — ACETAMINOPHEN 325 MG PO TABS: 650.0000 mg | ORAL_TABLET | ORAL | Status: DC | PRN

## 2017-07-23 MED ORDER — HYDROMORPHONE HCL 1 MG/ML IJ SOLN
INTRAMUSCULAR | Status: DC | PRN
  Administered 2017-07-23: 0.5 mg via INTRAVENOUS

## 2017-07-23 MED ORDER — CEFAZOLIN SODIUM 1 G IJ SOLR
INTRAMUSCULAR | Status: AC
  Filled 2017-07-23: qty 20

## 2017-07-23 MED ORDER — DOCUSATE SODIUM 100 MG PO CAPS
100.0000 mg | ORAL_CAPSULE | Freq: Two times a day (BID) | ORAL | Status: DC
  Administered 2017-07-23: 100 mg via ORAL
  Filled 2017-07-23: qty 1

## 2017-07-23 MED ORDER — SODIUM CHLORIDE 0.9 % IV SOLN
INTRAVENOUS | Status: DC | PRN
  Administered 2017-07-23: 14:00:00 via INTRAVENOUS

## 2017-07-23 MED ORDER — MIDAZOLAM HCL 2 MG/2ML IJ SOLN
INTRAMUSCULAR | Status: AC
  Filled 2017-07-23: qty 2

## 2017-07-23 MED ORDER — ROCURONIUM BROMIDE 10 MG/ML (PF) SYRINGE
PREFILLED_SYRINGE | INTRAVENOUS | Status: AC
  Filled 2017-07-23: qty 10

## 2017-07-23 MED ORDER — 0.9 % SODIUM CHLORIDE (POUR BTL) OPTIME
TOPICAL | Status: DC | PRN
  Administered 2017-07-23 (×3): 1000 mL

## 2017-07-23 MED ORDER — SODIUM CHLORIDE 0.9% FLUSH: 3.0000 mL | INTRAVENOUS | Status: DC | PRN

## 2017-07-23 MED ORDER — CEFAZOLIN SODIUM-DEXTROSE 2-4 GM/100ML-% IV SOLN
2.0000 g | Freq: Three times a day (TID) | INTRAVENOUS | Status: AC
  Administered 2017-07-23 – 2017-07-24 (×3): 2 g via INTRAVENOUS
  Filled 2017-07-23 (×2): qty 100

## 2017-07-23 MED ORDER — PANTOPRAZOLE SODIUM 40 MG IV SOLR
40.0000 mg | Freq: Every day | INTRAVENOUS | Status: DC
  Administered 2017-07-23 – 2017-07-24 (×2): 40 mg via INTRAVENOUS
  Filled 2017-07-23 (×3): qty 40

## 2017-07-23 MED ORDER — PHENYLEPHRINE HCL 10 MG/ML IJ SOLN
INTRAMUSCULAR | Status: DC | PRN
  Administered 2017-07-23 (×4): 80 ug via INTRAVENOUS

## 2017-07-23 MED ORDER — ONDANSETRON HCL 4 MG/2ML IJ SOLN: 4.0000 mg | Freq: Four times a day (QID) | INTRAMUSCULAR | Status: DC | PRN

## 2017-07-23 MED ORDER — HYDROMORPHONE HCL 1 MG/ML IJ SOLN
INTRAMUSCULAR | Status: AC
  Filled 2017-07-23: qty 0.5

## 2017-07-23 MED ORDER — FLEET ENEMA 7-19 GM/118ML RE ENEM: 1.0000 | ENEMA | Freq: Once | RECTAL | Status: DC | PRN

## 2017-07-23 MED ORDER — FENTANYL CITRATE (PF) 100 MCG/2ML IJ SOLN
INTRAMUSCULAR | Status: DC | PRN
  Administered 2017-07-23 (×2): 100 ug via INTRAVENOUS
  Administered 2017-07-23: 50 ug via INTRAVENOUS
  Administered 2017-07-23: 150 ug via INTRAVENOUS
  Administered 2017-07-23 (×2): 50 ug via INTRAVENOUS

## 2017-07-23 MED ORDER — CEFAZOLIN SODIUM-DEXTROSE 2-4 GM/100ML-% IV SOLN
2.0000 g | INTRAVENOUS | Status: AC
  Administered 2017-07-23 (×2): 2 g via INTRAVENOUS
  Filled 2017-07-23: qty 100

## 2017-07-23 MED ORDER — GABAPENTIN 300 MG PO CAPS
300.0000 mg | ORAL_CAPSULE | Freq: Three times a day (TID) | ORAL | Status: DC
  Administered 2017-07-23 – 2017-07-26 (×7): 300 mg via ORAL
  Filled 2017-07-23 (×7): qty 1

## 2017-07-23 MED ORDER — SODIUM CHLORIDE 0.9 % IV SOLN: 250.0000 mL | INTRAVENOUS | Status: DC

## 2017-07-23 MED ORDER — ZOLPIDEM TARTRATE 5 MG PO TABS: 5.0000 mg | ORAL_TABLET | Freq: Every evening | ORAL | Status: DC | PRN

## 2017-07-23 MED ORDER — POTASSIUM CHLORIDE IN NACL 20-0.9 MEQ/L-% IV SOLN
INTRAVENOUS | Status: DC
  Administered 2017-07-23: 23:00:00 via INTRAVENOUS
  Filled 2017-07-23: qty 1000

## 2017-07-23 MED ORDER — OXYCODONE-ACETAMINOPHEN 5-325 MG PO TABS
1.0000 | ORAL_TABLET | ORAL | Status: DC | PRN
  Administered 2017-07-23 – 2017-07-26 (×11): 2 via ORAL
  Filled 2017-07-23 (×11): qty 2

## 2017-07-23 MED ORDER — CHLORHEXIDINE GLUCONATE 4 % EX LIQD: 60.0000 mL | Freq: Once | CUTANEOUS | Status: DC

## 2017-07-23 MED ORDER — PROPOFOL 10 MG/ML IV BOLUS
INTRAVENOUS | Status: AC
  Filled 2017-07-23: qty 20

## 2017-07-23 MED ORDER — FENTANYL CITRATE (PF) 250 MCG/5ML IJ SOLN
INTRAMUSCULAR | Status: AC
  Filled 2017-07-23: qty 5

## 2017-07-23 MED ORDER — ONDANSETRON HCL 4 MG PO TABS: 4.0000 mg | ORAL_TABLET | Freq: Four times a day (QID) | ORAL | Status: DC | PRN

## 2017-07-23 MED ORDER — ROCURONIUM BROMIDE 100 MG/10ML IV SOLN
INTRAVENOUS | Status: DC | PRN
  Administered 2017-07-23 (×2): 50 mg via INTRAVENOUS
  Administered 2017-07-23: 40 mg via INTRAVENOUS

## 2017-07-23 MED ORDER — LIDOCAINE HCL (CARDIAC) 20 MG/ML IV SOLN
INTRAVENOUS | Status: DC | PRN
  Administered 2017-07-23: 100 mg via INTRAVENOUS

## 2017-07-23 MED ORDER — MORPHINE SULFATE (PF) 2 MG/ML IV SOLN
1.0000 mg | INTRAVENOUS | Status: DC | PRN
  Administered 2017-07-24: 2 mg via INTRAVENOUS
  Filled 2017-07-23: qty 1

## 2017-07-23 MED ORDER — ONDANSETRON HCL 4 MG/2ML IJ SOLN
INTRAMUSCULAR | Status: AC
  Filled 2017-07-23: qty 2

## 2017-07-23 MED ORDER — ALUM & MAG HYDROXIDE-SIMETH 200-200-20 MG/5ML PO SUSP: 30.0000 mL | Freq: Four times a day (QID) | ORAL | Status: DC | PRN

## 2017-07-23 MED ORDER — LIDOCAINE 2% (20 MG/ML) 5 ML SYRINGE
INTRAMUSCULAR | Status: AC
  Filled 2017-07-23: qty 15

## 2017-07-23 MED ORDER — BUPIVACAINE-EPINEPHRINE 0.5% -1:200000 IJ SOLN
INTRAMUSCULAR | Status: DC | PRN
  Administered 2017-07-23: 6 mL

## 2017-07-23 MED ORDER — DEXAMETHASONE SODIUM PHOSPHATE 10 MG/ML IJ SOLN
INTRAMUSCULAR | Status: AC
  Filled 2017-07-23: qty 1

## 2017-07-23 MED ORDER — THROMBIN 20000 UNITS EX KIT
PACK | CUTANEOUS | Status: AC
  Filled 2017-07-23: qty 1

## 2017-07-23 MED ORDER — HYDROMORPHONE HCL 1 MG/ML IJ SOLN
INTRAMUSCULAR | Status: AC
  Filled 2017-07-23: qty 1

## 2017-07-23 MED ORDER — BUPIVACAINE-EPINEPHRINE (PF) 0.5% -1:200000 IJ SOLN
INTRAMUSCULAR | Status: AC
  Filled 2017-07-23: qty 30

## 2017-07-23 MED ORDER — SUGAMMADEX SODIUM 200 MG/2ML IV SOLN
INTRAVENOUS | Status: DC | PRN
  Administered 2017-07-23: 300 mg via INTRAVENOUS

## 2017-07-23 MED ORDER — GENTAMICIN SULFATE 40 MG/ML IJ SOLN
5.0000 mg/kg | INTRAVENOUS | Status: AC
  Administered 2017-07-23: 550 mg via INTRAVENOUS
  Filled 2017-07-23: qty 13.75

## 2017-07-23 MED ORDER — ONDANSETRON HCL 4 MG/2ML IJ SOLN
INTRAMUSCULAR | Status: DC | PRN
  Administered 2017-07-23: 4 mg via INTRAVENOUS

## 2017-07-23 MED ORDER — PHENYLEPHRINE 40 MCG/ML (10ML) SYRINGE FOR IV PUSH (FOR BLOOD PRESSURE SUPPORT)
PREFILLED_SYRINGE | INTRAVENOUS | Status: AC
  Filled 2017-07-23: qty 10

## 2017-07-23 MED ORDER — THROMBIN (RECOMBINANT) 20000 UNITS EX SOLR
CUTANEOUS | Status: DC | PRN
  Administered 2017-07-23: 20000 [IU] via TOPICAL

## 2017-07-23 MED ORDER — PROPOFOL 10 MG/ML IV BOLUS
INTRAVENOUS | Status: DC | PRN
  Administered 2017-07-23: 200 mg via INTRAVENOUS

## 2017-07-23 MED ORDER — PHENOL 1.4 % MT LIQD: 1.0000 | OROMUCOSAL | Status: DC | PRN

## 2017-07-23 MED ORDER — ACETAMINOPHEN 650 MG RE SUPP: 650.0000 mg | RECTAL | Status: DC | PRN

## 2017-07-23 MED ORDER — LIDOCAINE HCL 2 % EX GEL
CUTANEOUS | Status: DC | PRN
  Administered 2017-07-23 (×2): 1

## 2017-07-23 MED ORDER — KETAMINE HCL 100 MG/ML IJ SOLN
INTRAMUSCULAR | Status: AC
  Filled 2017-07-23: qty 1

## 2017-07-23 MED ORDER — KETAMINE HCL 10 MG/ML IJ SOLN
INTRAMUSCULAR | Status: DC | PRN
  Administered 2017-07-23: 30 mg via INTRAVENOUS
  Administered 2017-07-23 (×2): 10 mg via INTRAVENOUS

## 2017-07-23 MED ORDER — PHENYLEPHRINE HCL 10 MG/ML IJ SOLN
INTRAVENOUS | Status: DC | PRN
  Administered 2017-07-23: 20 ug/min via INTRAVENOUS

## 2017-07-23 SURGICAL SUPPLY — 127 items
ADH SKN CLS APL DERMABOND .7 (GAUZE/BANDAGES/DRESSINGS) ×3
APL SKNCLS STERI-STRIP NONHPOA (GAUZE/BANDAGES/DRESSINGS) ×6
APPLIER CLIP 11 MED OPEN (CLIP) ×5
APR CLP MED 11 20 MLT OPN (CLIP) ×3
BENZOIN TINCTURE PRP APPL 2/3 (GAUZE/BANDAGES/DRESSINGS) ×4 IMPLANT
BLADE CLIPPER SURG (BLADE) ×3 IMPLANT
BLADE SURG 10 STRL SS (BLADE) ×5 IMPLANT
BONE VIVIGEN FORMABLE 10CC (Bone Implant) ×15 IMPLANT
CAGE COROENT XL PLUS 10X18X55 (Cage) ×4 IMPLANT
CAGE COUGAR ALIF MED 5 12 (Cage) ×1 IMPLANT
CAGE COUGAR ALIF MED 5 12MM (Cage) ×1 IMPLANT
CAGE COUGAR LG 14 10 (Cage) ×1 IMPLANT
CAGE COUGAR LG 14MM 10 (Cage) ×1 IMPLANT
CATH BONANNO SUPRAPUBIC 14G (CATHETERS) ×2 IMPLANT
CATH COUDE FOLEY 5CC 12FR (CATHETERS) ×3 IMPLANT
CATH COUDE FOLEY 5CC 14FR (CATHETERS) ×3 IMPLANT
CATH FOLEY 2WAY 5CC 16FR (CATHETERS) ×15
CATH URTH STD 16FR FL 2W DRN (CATHETERS) ×3 IMPLANT
CLIP APPLIE 11 MED OPEN (CLIP) ×6 IMPLANT
CLIP LIGATING EXTRA MED SLVR (CLIP) ×5 IMPLANT
CLIP LIGATING EXTRA SM BLUE (MISCELLANEOUS) ×2 IMPLANT
CLOSURE WOUND 1/2 X4 (GAUZE/BANDAGES/DRESSINGS) ×2
CORDS BIPOLAR (ELECTRODE) ×7 IMPLANT
COVER BACK TABLE 80X110 HD (DRAPES) ×5 IMPLANT
COVER MAYO STAND STRL (DRAPES) ×6 IMPLANT
COVER SURGICAL LIGHT HANDLE (MISCELLANEOUS) ×9 IMPLANT
DERMABOND ADVANCED (GAUZE/BANDAGES/DRESSINGS) ×2
DERMABOND ADVANCED .7 DNX12 (GAUZE/BANDAGES/DRESSINGS) ×3 IMPLANT
DRAPE C-ARM 42X72 X-RAY (DRAPES) ×10 IMPLANT
DRAPE C-ARMOR (DRAPES) ×2 IMPLANT
DRAPE INCISE IOBAN 66X45 STRL (DRAPES) ×2 IMPLANT
DRAPE POUCH INSTRU U-SHP 10X18 (DRAPES) ×7 IMPLANT
DRAPE SURG 17X23 STRL (DRAPES) ×32 IMPLANT
DRSG MEPILEX BORDER 4X12 (GAUZE/BANDAGES/DRESSINGS) ×3 IMPLANT
DURAPREP 26ML APPLICATOR (WOUND CARE) ×9 IMPLANT
ELECT BLADE 4.0 EZ CLEAN MEGAD (MISCELLANEOUS) ×10
ELECT BLADE 6.5 EXT (BLADE) ×5 IMPLANT
ELECT CAUTERY BLADE 6.4 (BLADE) ×5 IMPLANT
ELECT REM PT RETURN 9FT ADLT (ELECTROSURGICAL) ×5
ELECTRODE BLDE 4.0 EZ CLN MEGD (MISCELLANEOUS) ×6 IMPLANT
ELECTRODE REM PT RTRN 9FT ADLT (ELECTROSURGICAL) ×3 IMPLANT
FEE INTRAOP MONITOR IMPULS NCS (MISCELLANEOUS) IMPLANT
GAUZE SPONGE 4X4 12PLY STRL (GAUZE/BANDAGES/DRESSINGS) ×4 IMPLANT
GAUZE SPONGE 4X4 16PLY XRAY LF (GAUZE/BANDAGES/DRESSINGS) ×8 IMPLANT
GLOVE BIO SURGEON STRL SZ 6.5 (GLOVE) ×20 IMPLANT
GLOVE BIO SURGEON STRL SZ7 (GLOVE) ×14 IMPLANT
GLOVE BIO SURGEON STRL SZ8 (GLOVE) ×11 IMPLANT
GLOVE BIO SURGEONS STRL SZ 6.5 (GLOVE) ×10
GLOVE BIOGEL PI IND STRL 6.5 (GLOVE) ×12 IMPLANT
GLOVE BIOGEL PI IND STRL 7.0 (GLOVE) ×3 IMPLANT
GLOVE BIOGEL PI IND STRL 8 (GLOVE) ×7 IMPLANT
GLOVE BIOGEL PI INDICATOR 6.5 (GLOVE) ×12
GLOVE BIOGEL PI INDICATOR 7.0 (GLOVE) ×8
GLOVE BIOGEL PI INDICATOR 8 (GLOVE) ×6
GLOVE SS BIOGEL STRL SZ 7.5 (GLOVE) ×3 IMPLANT
GLOVE SUPERSENSE BIOGEL SZ 7.5 (GLOVE) ×2
GOWN STRL REUS W/ TWL LRG LVL3 (GOWN DISPOSABLE) ×13 IMPLANT
GOWN STRL REUS W/ TWL XL LVL3 (GOWN DISPOSABLE) ×7 IMPLANT
GOWN STRL REUS W/TWL LRG LVL3 (GOWN DISPOSABLE) ×35
GOWN STRL REUS W/TWL XL LVL3 (GOWN DISPOSABLE) ×15
GRAFT BNE MATRIX VG FRMBL L 10 (Bone Implant) IMPLANT
GUIDEWIRE STR DUAL SENSOR (WIRE) ×2 IMPLANT
HEMOSTAT SURGICEL 2X14 (HEMOSTASIS) IMPLANT
INSERT FOGARTY 61MM (MISCELLANEOUS) IMPLANT
INSERT FOGARTY SM (MISCELLANEOUS) IMPLANT
INTRAOP MONITOR FEE IMPULS NCS (MISCELLANEOUS) ×3
INTRAOP MONITOR FEE IMPULSE (MISCELLANEOUS) ×2
KIT BASIN OR (CUSTOM PROCEDURE TRAY) ×5 IMPLANT
KIT DILATOR XLIF 5 (KITS) IMPLANT
KIT ROOM TURNOVER OR (KITS) ×5 IMPLANT
KIT SURGICAL ACCESS MAXCESS 4 (KITS) ×3 IMPLANT
KIT XLIF (KITS) ×2
LOOP VESSEL MAXI BLUE (MISCELLANEOUS) IMPLANT
LOOP VESSEL MINI RED (MISCELLANEOUS) IMPLANT
MARKER SKIN DUAL TIP RULER LAB (MISCELLANEOUS) ×8 IMPLANT
MODULE EMG NDL SSEP NVM5 (NEEDLE) IMPLANT
MODULE EMG NEEDLE SSEP NVM5 (NEEDLE) ×5 IMPLANT
MODULE NVM5 NEXT GEN EMG (NEEDLE) ×2 IMPLANT
NDL HYPO 25GX1X1/2 BEV (NEEDLE) ×3 IMPLANT
NDL SPNL 18GX3.5 QUINCKE PK (NEEDLE) ×3 IMPLANT
NEEDLE HYPO 25GX1X1/2 BEV (NEEDLE) ×10 IMPLANT
NEEDLE SPNL 18GX3.5 QUINCKE PK (NEEDLE) ×5 IMPLANT
NS IRRIG 1000ML POUR BTL (IV SOLUTION) ×10 IMPLANT
PACK LAMINECTOMY ORTHO (CUSTOM PROCEDURE TRAY) ×5 IMPLANT
PACK UNIVERSAL I (CUSTOM PROCEDURE TRAY) ×7 IMPLANT
PAD ARMBOARD 7.5X6 YLW CONV (MISCELLANEOUS) ×26 IMPLANT
PATTIES SURGICAL .5 X1 (DISPOSABLE) ×5 IMPLANT
PROBE MONOPOLAR STIMULATOR (NEUROSURGERY SUPPLIES) ×3 IMPLANT
SCREW 6.5MMX30MM (Screw) ×2 IMPLANT
SCREW 6.5X30 (Screw) ×2 IMPLANT
SET CYSTO W/LG BORE CLAMP LF (SET/KITS/TRAYS/PACK) ×2 IMPLANT
SPONGE INTESTINAL PEANUT (DISPOSABLE) ×15 IMPLANT
SPONGE LAP 18X18 X RAY DECT (DISPOSABLE) IMPLANT
SPONGE LAP 4X18 X RAY DECT (DISPOSABLE) ×14 IMPLANT
SPONGE SURGIFOAM ABS GEL 100 (HEMOSTASIS) ×10 IMPLANT
STAPLER VISISTAT 35W (STAPLE) ×5 IMPLANT
STRIP CLOSURE SKIN 1/2X4 (GAUZE/BANDAGES/DRESSINGS) ×4 IMPLANT
SURGIFLO W/THROMBIN 8M KIT (HEMOSTASIS) IMPLANT
SUT ETHILON 2 0 FS 18 (SUTURE) ×3 IMPLANT
SUT MNCRL AB 4-0 PS2 18 (SUTURE) ×5 IMPLANT
SUT PDS AB 1 CTX 36 (SUTURE) ×6 IMPLANT
SUT PROLENE 4 0 RB 1 (SUTURE)
SUT PROLENE 4-0 RB1 .5 CRCL 36 (SUTURE) IMPLANT
SUT PROLENE 5 0 C 1 24 (SUTURE) ×6 IMPLANT
SUT PROLENE 5 0 CC1 (SUTURE) ×2 IMPLANT
SUT PROLENE 6 0 C 1 30 (SUTURE) ×5 IMPLANT
SUT PROLENE 6 0 CC (SUTURE) IMPLANT
SUT SILK 0 TIES 10X30 (SUTURE) ×5 IMPLANT
SUT SILK 2 0 TIES 10X30 (SUTURE) ×6 IMPLANT
SUT SILK 2 0SH CR/8 30 (SUTURE) IMPLANT
SUT SILK 3 0 TIES 10X30 (SUTURE) ×10 IMPLANT
SUT SILK 3 0SH CR/8 30 (SUTURE) IMPLANT
SUT VIC AB 0 CT1 18XCR BRD 8 (SUTURE) ×3 IMPLANT
SUT VIC AB 0 CT1 8-18 (SUTURE) ×10
SUT VIC AB 1 CT1 27 (SUTURE) ×10
SUT VIC AB 1 CT1 27XBRD ANBCTR (SUTURE) ×6 IMPLANT
SUT VIC AB 1 CTX 36 (SUTURE) ×15
SUT VIC AB 1 CTX36XBRD ANBCTR (SUTURE) ×6 IMPLANT
SUT VIC AB 2-0 CT2 18 VCP726D (SUTURE) ×5 IMPLANT
SYR BULB IRRIGATION 50ML (SYRINGE) ×5 IMPLANT
SYR CONTROL 10ML LL (SYRINGE) ×5 IMPLANT
TOWEL GREEN STERILE (TOWEL DISPOSABLE) ×2 IMPLANT
TOWEL OR 17X24 6PK STRL BLUE (TOWEL DISPOSABLE) ×5 IMPLANT
TOWEL OR 17X26 10 PK STRL BLUE (TOWEL DISPOSABLE) ×7 IMPLANT
WASHER 13.0MM (Orthopedic Implant) ×6 IMPLANT
WATER STERILE IRR 1000ML POUR (IV SOLUTION) ×5 IMPLANT
YANKAUER SUCT BULB TIP NO VENT (SUCTIONS) ×5 IMPLANT

## 2017-07-23 NOTE — Op Note (Signed)
Preoperative diagnosis: Urethral stricture  Postoperative diagnosis: Same, with probable false passage  Principal procedure: Cystoscopy, attempted access into the bladder with sensor tip guidewire, placement of Bonnano suprapubic tube  Surgeon: Kden Wagster  Anesthesia: General endotracheal  Complications: Inability to access bladder the urethra  Estimated blood loss: 30 cc  Drains: Bonnano suprapubic catheter  Indications: 62 year old male without known urologic history (not a current patient in our practice) who is scheduled for anterior lumbar fusion by Dr. Lynann Bologna.  Following anesthetic administration, placement of Foley catheter was difficult, with eventual blood through his meatus.  Urologic consultation.  Findings: Obliterated posterior urethra.  Inability to access bladder with either the cystoscope or new 0.038 inch sensor tip guidewire.  Description of procedure: The patient's penis was prepped and draped and 30 cc of 2% viscous lidocaine was introduced into his urethra.  Flexible cystoscope with water as an irrigant was used to inspect the urethra.  Penile urethra was normal.  In the bulbous urethra, there was an obliterated urethra with periurethral fibers seen.  Despite an extended period of time trying to find the proper urethra with the scope and the 0.038 inch sensor tip guidewire, I was unable to find the proper urethra.  At this point, the patient's suprapubic tube was prepped to the right of midline, as I did not want to influence the patient's eventual extraperitoneal exposure of his lumbosacral spine.  Using a 5 cm 22-gauge spinal needle, I was eventually able to find the bladder, with slightly bloody urine obtained with aspiration.  Once I found the proper site for reaching the bladder, the Bonnano suprapubic tube was passed in the same manner, aspirating withthe syringes I introduced the trocar/tube.  Once the bladder was identified, the trocar was removed from the catheter,  and the catheter passed into the bladder.  Again, I establish proper position by aspiration.  The Bonnano catheter was then shaved the skin with 2-0 silk's.  It was hooked to dependent drainage.  Dr. Donnetta Hutching and [facility] then commenced with their procedure.  The patient was covered with Ancef and gentamicin.  Follow-up will be provided in his postoperative course.

## 2017-07-23 NOTE — Anesthesia Postprocedure Evaluation (Signed)
Anesthesia Post Note  Patient: Anthony Campos  Procedure(s) Performed: LUMBAR 4-5, LUMBAR 5-SACRUM 1, ANTERIOR LUMBAR INTERBODY FUSION WITH INSTRUMENTATION, ALLOGRAFT REQUESTED TIME 6 HRS (N/A ) LUMBAR 3-4 ANTERIOR LATERAL LUMBAR FUSION 1 LEVEL (N/A ) CYSTOSCOPY FLEXIBLE, ATTEMPTED CATHETER INSERTION (Bladder) INSERTION OF SUPRAPUBIC CATHETER (Right Bladder) ABDOMINAL EXPOSURE (N/A )     Anesthesia Type: General    Last Vitals:  Vitals:   07/23/17 1720 07/23/17 1746  BP: (!) 158/87 (!) 157/93  Pulse: 78 81  Resp: 16 15  Temp:    SpO2: 97% 90%    Last Pain:  Vitals:   07/23/17 1730  TempSrc:   PainSc: 4                  Barnet Glasgow

## 2017-07-23 NOTE — Transfer of Care (Signed)
Immediate Anesthesia Transfer of Care Note  Patient: Anthony Campos  Procedure(s) Performed: LUMBAR 4-5, LUMBAR 5-SACRUM 1, ANTERIOR LUMBAR INTERBODY FUSION WITH INSTRUMENTATION, ALLOGRAFT REQUESTED TIME 6 HRS (N/A ) LUMBAR 3-4 ANTERIOR LATERAL LUMBAR FUSION 1 LEVEL (N/A ) CYSTOSCOPY FLEXIBLE, ATTEMPTED CATHETER INSERTION (Bladder) INSERTION OF SUPRAPUBIC CATHETER (Right Bladder) ABDOMINAL EXPOSURE (N/A )  Patient Location: PACU  Anesthesia Type:General  Level of Consciousness: awake and alert   Airway & Oxygen Therapy: Patient Spontanous Breathing and Patient connected to nasal cannula oxygen  Post-op Assessment: Report given to RN, Post -op Vital signs reviewed and stable and Patient moving all extremities  Post vital signs: Reviewed and stable  Last Vitals:  Vitals:   07/23/17 0708 07/23/17 1700  BP: 111/81   Pulse: 83   Resp: 20   Temp: (!) 36.3 C (!) 36.2 C  SpO2: 97%     Last Pain:  Vitals:   07/23/17 1700  TempSrc:   PainSc: Asleep      Patients Stated Pain Goal: 2 (82/88/33 7445)  Complications: No apparent anesthesia complications

## 2017-07-23 NOTE — Anesthesia Procedure Notes (Signed)
Procedure Name: Intubation Date/Time: 07/23/2017 8:50 AM Performed by: Thelbert Gartin T, CRNA Pre-anesthesia Checklist: Patient identified, Emergency Drugs available, Suction available and Patient being monitored Patient Re-evaluated:Patient Re-evaluated prior to induction Oxygen Delivery Method: Circle system utilized Preoxygenation: Pre-oxygenation with 100% oxygen Induction Type: IV induction Ventilation: Mask ventilation without difficulty Laryngoscope Size: Miller and 3 Grade View: Grade III Tube type: Oral Tube size: 7.5 mm Number of attempts: 2 Airway Equipment and Method: Patient positioned with wedge pillow and Stylet Placement Confirmation: ETT inserted through vocal cords under direct vision,  positive ETCO2 and breath sounds checked- equal and bilateral Secured at: 22 cm Tube secured with: Tape Dental Injury: Teeth and Oropharynx as per pre-operative assessment  Difficulty Due To: Difficulty was anticipated, Difficult Airway- due to anterior larynx and Difficult Airway- due to limited oral opening Future Recommendations: Recommend- induction with short-acting agent, and alternative techniques readily available

## 2017-07-23 NOTE — Anesthesia Preprocedure Evaluation (Addendum)
Anesthesia Evaluation  Patient identified by MRN, date of birth, ID band Patient awake    Reviewed: Allergy & Precautions, NPO status , Patient's Chart, lab work & pertinent test results  Airway Mallampati: III       Dental no notable dental hx. (+) Poor Dentition, Teeth Intact   Pulmonary neg pulmonary ROS, former smoker,    Pulmonary exam normal        Cardiovascular hypertension, negative cardio ROS   Rhythm:Regular Rate:Normal     Neuro/Psych negative neurological ROS  negative psych ROS   GI/Hepatic negative GI ROS, Neg liver ROS,   Endo/Other  negative endocrine ROS  Renal/GU negative Renal ROS  negative genitourinary   Musculoskeletal negative musculoskeletal ROS (+)   Abdominal   Peds  Hematology negative hematology ROS (+)   Anesthesia Other Findings   Reproductive/Obstetrics                             Lab Results  Component Value Date   WBC 6.6 07/21/2017   HGB 13.9 07/21/2017   HCT 41.3 07/21/2017   MCV 89.4 07/21/2017   PLT 208 07/21/2017   Lab Results  Component Value Date   CREATININE 1.01 07/21/2017   BUN 20 07/21/2017   NA 138 07/21/2017   K 4.1 07/21/2017   CL 104 07/21/2017   CO2 24 07/21/2017    Anesthesia Physical Anesthesia Plan  ASA: II  Anesthesia Plan: General   Post-op Pain Management:    Induction: Intravenous  PONV Risk Score and Plan: Treatment may vary due to age or medical condition, Dexamethasone and Ondansetron  Airway Management Planned: Oral ETT  Additional Equipment:   Intra-op Plan:   Post-operative Plan: Extubation in OR  Informed Consent: I have reviewed the patients History and Physical, chart, labs and discussed the procedure including the risks, benefits and alternatives for the proposed anesthesia with the patient or authorized representative who has indicated his/her understanding and acceptance.   Dental advisory  given  Plan Discussed with: CRNA  Anesthesia Plan Comments:         Anesthesia Quick Evaluation

## 2017-07-23 NOTE — Progress Notes (Signed)
Patient arrived form PACU at this time. VSS. Pt/family aware of second surgery in AM and to be NPO after M[place].  Ave Filter, RN

## 2017-07-23 NOTE — H&P (Signed)
PREOPERATIVE H&P  Chief Complaint: Bilateral leg pain  HPI: Anthony Campos is a 62 y.o. male who presents with ongoing pain in the bilateral legs  MRI reveals severe stenosis spanning L3-S1 with x-rays notable for a degenerative lumbar curve  Patient has failed multiple forms of conservative care and continues to have pain (see office notes for additional details regarding the patient's full course of treatment)  Past Medical History:  Diagnosis Date  . Erectile dysfunction   . Hyperlipidemia   . Hypertension   . Infection of skin and subcutaneous tissue   . Lipoma   . Tobacco abuse    hx   Past Surgical History:  Procedure Laterality Date  . COLONOSCOPY     01-2006  . HERNIA REPAIR  2005   Right hernia repair    Social History   Socioeconomic History  . Marital status: Married    Spouse name: None  . Number of children: None  . Years of education: None  . Highest education level: None  Social Needs  . Financial resource strain: None  . Food insecurity - worry: None  . Food insecurity - inability: None  . Transportation needs - medical: None  . Transportation needs - non-medical: None  Occupational History  . None  Tobacco Use  . Smoking status: Former Smoker    Packs/day: 0.25    Types: Cigarettes    Last attempt to quit: 06/28/2017    Years since quitting: 0.0  . Smokeless tobacco: Never Used  . Tobacco comment:  has cut back- 3 cigs a day   Substance and Sexual Activity  . Alcohol use: Yes    Alcohol/week: 0.0 oz    Comment: Beer - rarely.- holidays like thanksgiving and x mas   . Drug use: No  . Sexual activity: None  Other Topics Concern  . None  Social History Narrative   Occupation- Radiation protection practitioner.   Lives with his wife. Has adult children and three grandchildren.   Family History  Problem Relation Age of Onset  . Depression Sister   . Heart disease Neg Hx   . Hypertension Neg Hx   . Stroke Neg Hx   . Colon polyps Neg Hx   .  Colon cancer Neg Hx   . Rectal cancer Neg Hx   . Stomach cancer Neg Hx   . Esophageal cancer Neg Hx    No Known Allergies Prior to Admission medications   Medication Sig Start Date End Date Taking? Authorizing Provider  atorvastatin (LIPITOR) 40 MG tablet TAKE 1 TABLET BY MOUTH EVERY DAY Patient taking differently: Take 40 mg by mouth daily.  12/10/16  Yes Shela Leff, MD  gabapentin (NEURONTIN) 300 MG capsule Take 300 mg by mouth 3 (three) times daily.   Yes [provider]  hydrochlorothiazide (HYDRODIURIL) 12.5 MG tablet TAKE 1 TABLET BY MOUTH EVERY DAY 06/23/17  Yes Shela Leff, MD  HYDROcodone-acetaminophen (NORCO) 10-325 MG tablet Take 1 tablet by mouth every 6 (six) hours as needed for moderate pain. for pain 05/26/17  Yes [provider]  tiZANidine (ZANAFLEX) 4 MG tablet Take 4 mg by mouth every 6 (six) hours as needed for muscle spasms.   Yes [provider]  diazepam (VALIUM) 2 MG tablet Take 1 tablet (2 mg total) by mouth every 6 (six) hours as needed for muscle spasms. Patient not taking: Reported on 07/14/2017 04/09/17   Lacretia Leigh, MD  HYDROcodone-acetaminophen [facility]) 5-325 MG tablet Take 1 tablet by  mouth every 6 (six) hours as needed for moderate pain. Patient not taking: Reported on 07/14/2017 04/01/17   Robyn Haber, MD  sildenafil (REVATIO) 20 MG tablet Take 1-2 tablets once daily as needed. Patient not taking: Reported on 07/14/2017 12/10/16   Shela Leff, MD     All other systems have been reviewed and were otherwise negative with the exception of those mentioned in the HPI and as above.  Physical Exam: Vitals:   07/23/17 0708  BP: 111/81  Pulse: 83  Resp: 20  Temp: (!) 97.3 F (36.3 C)  SpO2: 97%    There is no height or weight on file to calculate BMI.  General: Alert, no acute distress Cardiovascular: No pedal edema Respiratory: No cyanosis, no use of accessory musculature Skin: No lesions in the area of  chief complaint Neurologic: Sensation intact distally Psychiatric: Patient is competent for consent with normal mood and affect Lymphatic: No axillary or cervical lymphadenopathy  MUSCULOSKELETAL: Diffuse b/l leg weakness  Assessment/Plan: BILATERAL LEG PAIN Plan for Procedure(s): LUMBAR 4-5, LUMBAR 5-SACRUM 1, ANTERIOR LUMBAR INTERBODY FUSION WITH INSTRUMENTATION, ALLOGRAFT, LUMBAR 3-4 ANTERIOR LATERAL LUMBAR FUSION 1 LEVEL   Sinclair Ship, MD 07/23/2017 7:35 AM

## 2017-07-23 NOTE — Anesthesia Postprocedure Evaluation (Signed)
Anesthesia Post Note  Patient: Anthony Campos  Procedure(s) Performed: LUMBAR 4-5, LUMBAR 5-SACRUM 1, ANTERIOR LUMBAR INTERBODY FUSION WITH INSTRUMENTATION, ALLOGRAFT REQUESTED TIME 6 HRS (N/A ) LUMBAR 3-4 ANTERIOR LATERAL LUMBAR FUSION 1 LEVEL (N/A ) CYSTOSCOPY FLEXIBLE, ATTEMPTED CATHETER INSERTION (Bladder) INSERTION OF SUPRAPUBIC CATHETER (Right Bladder) ABDOMINAL EXPOSURE (N/A )     Patient location during evaluation: PACU Anesthesia Type: General Level of consciousness: awake and alert Pain management: pain level controlled Vital Signs Assessment: post-procedure vital signs reviewed and stable Respiratory status: spontaneous breathing, nonlabored ventilation, respiratory function stable and patient connected to nasal cannula oxygen Cardiovascular status: blood pressure returned to baseline and stable Postop Assessment: no apparent nausea or vomiting Anesthetic complications: no    Last Vitals:  Vitals:   07/23/17 1720 07/23/17 1746  BP: (!) 158/87 (!) 157/93  Pulse: 78 81  Resp: 16 15  Temp:    SpO2: 97% 90%    Last Pain:  Vitals:   07/23/17 1730  TempSrc:   PainSc: 4                  Barnet Glasgow

## 2017-07-23 NOTE — Op Note (Signed)
    OPERATIVE REPORT  DATE OF SURGERY: 07/23/2017  PATIENT: Anthony Campos, 62 y.o. male MRN: [id number]  DOB: 1955-10-04  PRE-OPERATIVE DIAGNOSIS: Degenerative disc disease  POST-OPERATIVE DIAGNOSIS:  Same  PROCEDURE: Anterior exposure for L4-5 and L5-S1 disc surgery  SURGEON:  Curt Jews, M.D.  Co-surgeon for the exposure Dr. Phylliss Bob    ANESTHESIA: General  EBL: 500 total for the case ml  Total I/O In: 1914 [I.V.:3600; Blood:150] Out: 1260 [Urine:760; Blood:500]  BLOOD ADMINISTERED: None  DRAINS: None  SPECIMEN: None  COUNTS CORRECT:  YES  PLAN OF CARE: PACU  PATIENT DISPOSITION:  PACU - hemodynamically stable  PROCEDURE DETAILS: The patient was taken to the operating room and there was difficulty inserting the Foley.  He eventually had a suprapubic tube placed by Dr. Diona Fanti.  This was to the right of the midline retroperitoneal space fully with Dr. Lynann Bologna and [place] and myself we felt that this would not be prohibitive risk for surgery.  A left paramedian incision was made and carried down through the subcutaneous fat with electrocautery.  The anterior rectus sheath was opened in line with the skin incision.  The rectus muscle was mobilized circumferentially.  The retroperitoneal space was entered in the left lower quadrant and the intraperitoneal contents were mobilized bluntly to the right.  The left ureter was identified and it was mobilized to the right as well.  Dissection was continued above the level of the psoas muscle to the iliac vessels and the L5-S1 disc was exposed between the iliac vessels.  The middle sacral vessels were clipped and divided.  Blunt dissection was used to give adequate exposure to the right and left of the L5-S1 disc.  Attention was then turned to the L4-5 disc.  The iliac vessels and aorta were rotated to the right.  Lumbar vessels were clipped and divided at the 3 4 level and blunt dissection was continued.  The patient had a large  iliolumbar vein in the ligated with 2-0 silk ties and divided.  Blunt dissection was continued to give exposure to the L4-5 disc.  The Thompson retractor was brought onto the field and the 150 reverse lip blades were positioned to the right and left of the L5-S1 disc.  Malleable retractors were used for superior and inferior exposure.  The discectomy and fusion will be dictated as a separate note by Dr. Lynann Bologna.  Next returned and re-scrubbed and moved to the retractors to position at the level of the L4-5 disc.  The remainder of the procedure will be dictated as a separate note with Dr. Levora Dredge, M.D., [facility] 07/23/2017 5:55 PM

## 2017-07-24 ENCOUNTER — Inpatient Hospital Stay (HOSPITAL_COMMUNITY): Payer: BC Managed Care – PPO

## 2017-07-24 ENCOUNTER — Inpatient Hospital Stay (HOSPITAL_COMMUNITY): Payer: BC Managed Care – PPO | Admitting: Certified Registered Nurse Anesthetist

## 2017-07-24 ENCOUNTER — Encounter (HOSPITAL_COMMUNITY): Payer: Self-pay | Admitting: Certified Registered Nurse Anesthetist

## 2017-07-24 ENCOUNTER — Inpatient Hospital Stay: Admit: 2017-07-24 | Payer: BC Managed Care – PPO | Admitting: Orthopedic Surgery

## 2017-07-24 ENCOUNTER — Encounter (HOSPITAL_COMMUNITY)
Admission: RE | Disposition: A | Discharge status: Home / Self Care | Payer: Self-pay | Source: Ambulatory Visit | Attending: Orthopedic Surgery

## 2017-07-24 ENCOUNTER — Inpatient Hospital Stay (HOSPITAL_COMMUNITY)
Admission: RE | Admit: 2017-07-24 | Payer: BC Managed Care – PPO | Source: Ambulatory Visit | Admitting: Orthopedic Surgery

## 2017-07-24 ENCOUNTER — Other Ambulatory Visit: Payer: Self-pay

## 2017-07-24 LAB — POCT I-STAT 4, (NA,K, GLUC, HGB,HCT)
Glucose, Bld: 152 mg/dL — ABNORMAL HIGH (ref 65–99)
HEMATOCRIT: 34 % — AB (ref 39.0–52.0)
HEMOGLOBIN: 11.6 g/dL — AB (ref 13.0–17.0)
Potassium: 4 mmol/L (ref 3.5–5.1)
SODIUM: 140 mmol/L (ref 135–145)

## 2017-07-24 SURGERY — POSTERIOR LUMBAR FUSION 3 LEVEL
Anesthesia: General

## 2017-07-24 SURGERY — ANTERIOR LUMBAR FUSION 2 LEVELS
Anesthesia: General

## 2017-07-24 MED ORDER — DEXAMETHASONE SODIUM PHOSPHATE 10 MG/ML IJ SOLN
INTRAMUSCULAR | Status: DC | PRN
  Administered 2017-07-24: 10 mg via INTRAVENOUS

## 2017-07-24 MED ORDER — CEFAZOLIN SODIUM-DEXTROSE 2-4 GM/100ML-% IV SOLN
2.0000 g | Freq: Three times a day (TID) | INTRAVENOUS | Status: AC
  Administered 2017-07-24 (×2): 2 g via INTRAVENOUS
  Filled 2017-07-24 (×2): qty 100

## 2017-07-24 MED ORDER — MENTHOL 3 MG MT LOZG: 1.0000 | LOZENGE | OROMUCOSAL | Status: DC | PRN

## 2017-07-24 MED ORDER — PHENOL 1.4 % MT LIQD: 1.0000 | OROMUCOSAL | Status: DC | PRN

## 2017-07-24 MED ORDER — DIAZEPAM 5 MG PO TABS
5.0000 mg | ORAL_TABLET | Freq: Four times a day (QID) | ORAL | Status: DC | PRN
  Administered 2017-07-24 – 2017-07-26 (×7): 5 mg via ORAL
  Filled 2017-07-24 (×6): qty 1

## 2017-07-24 MED ORDER — ALUM & MAG HYDROXIDE-SIMETH 200-200-20 MG/5ML PO SUSP: 30.0000 mL | Freq: Four times a day (QID) | ORAL | Status: DC | PRN

## 2017-07-24 MED ORDER — PROMETHAZINE HCL 25 MG/ML IJ SOLN: 6.2500 mg | INTRAMUSCULAR | Status: DC | PRN

## 2017-07-24 MED ORDER — ONDANSETRON HCL 4 MG PO TABS: 4.0000 mg | ORAL_TABLET | Freq: Four times a day (QID) | ORAL | Status: DC | PRN

## 2017-07-24 MED ORDER — MORPHINE SULFATE (PF) 4 MG/ML IV SOLN
1.0000 mg | INTRAVENOUS | Status: DC | PRN
  Administered 2017-07-24: 2 mg via INTRAVENOUS
  Filled 2017-07-24: qty 1

## 2017-07-24 MED ORDER — ACETAMINOPHEN 650 MG RE SUPP: 650.0000 mg | RECTAL | Status: DC | PRN

## 2017-07-24 MED ORDER — PROPOFOL 500 MG/50ML IV EMUL
INTRAVENOUS | Status: DC | PRN
  Administered 2017-07-24: 50 ug/kg/min via INTRAVENOUS

## 2017-07-24 MED ORDER — KETAMINE HCL 10 MG/ML IJ SOLN
INTRAMUSCULAR | Status: DC | PRN
  Administered 2017-07-24: 30 mg via INTRAVENOUS
  Administered 2017-07-24: 20 mg via INTRAVENOUS

## 2017-07-24 MED ORDER — DEXTROSE 5 % IV SOLN
INTRAVENOUS | Status: DC | PRN
  Administered 2017-07-24: 40 ug/min via INTRAVENOUS

## 2017-07-24 MED ORDER — FENTANYL CITRATE (PF) 100 MCG/2ML IJ SOLN
INTRAMUSCULAR | Status: DC | PRN
  Administered 2017-07-24 (×3): 50 ug via INTRAVENOUS
  Administered 2017-07-24: 25 ug via INTRAVENOUS
  Administered 2017-07-24: 50 ug via INTRAVENOUS
  Administered 2017-07-24: 25 ug via INTRAVENOUS
  Administered 2017-07-24 (×2): 50 ug via INTRAVENOUS
  Administered 2017-07-24: 100 ug via INTRAVENOUS

## 2017-07-24 MED ORDER — ACETAMINOPHEN 10 MG/ML IV SOLN: 1000.0000 mg | Freq: Once | INTRAVENOUS | Status: DC | PRN

## 2017-07-24 MED ORDER — LIDOCAINE 2% (20 MG/ML) 5 ML SYRINGE
INTRAMUSCULAR | Status: DC | PRN
  Administered 2017-07-24: 100 mg via INTRAVENOUS

## 2017-07-24 MED ORDER — MIDAZOLAM HCL 5 MG/5ML IJ SOLN
INTRAMUSCULAR | Status: DC | PRN
  Administered 2017-07-24: 2 mg via INTRAVENOUS

## 2017-07-24 MED ORDER — LACTATED RINGERS IV SOLN
INTRAVENOUS | Status: DC | PRN
  Administered 2017-07-24 (×2): via INTRAVENOUS

## 2017-07-24 MED ORDER — ONDANSETRON HCL 4 MG/2ML IJ SOLN
INTRAMUSCULAR | Status: DC | PRN
  Administered 2017-07-24: 4 mg via INTRAVENOUS

## 2017-07-24 MED ORDER — HYDROMORPHONE HCL 1 MG/ML IJ SOLN: 0.2500 mg | INTRAMUSCULAR | Status: DC | PRN

## 2017-07-24 MED ORDER — HYDROCODONE-ACETAMINOPHEN 7.5-325 MG PO TABS
1.0000 | ORAL_TABLET | Freq: Once | ORAL | Status: AC | PRN
  Administered 2017-07-24: 1 via ORAL

## 2017-07-24 MED ORDER — FENTANYL CITRATE (PF) 250 MCG/5ML IJ SOLN
INTRAMUSCULAR | Status: AC
  Filled 2017-07-24: qty 5

## 2017-07-24 MED ORDER — LACTATED RINGERS IV SOLN
INTRAVENOUS | Status: DC | PRN
  Administered 2017-07-24: 07:00:00 via INTRAVENOUS

## 2017-07-24 MED ORDER — PROPOFOL 10 MG/ML IV BOLUS
INTRAVENOUS | Status: AC
  Filled 2017-07-24: qty 20

## 2017-07-24 MED ORDER — FLEET ENEMA 7-19 GM/118ML RE ENEM: 1.0000 | ENEMA | Freq: Once | RECTAL | Status: DC | PRN

## 2017-07-24 MED ORDER — POTASSIUM CHLORIDE IN NACL 20-0.9 MEQ/L-% IV SOLN: INTRAVENOUS | Status: DC

## 2017-07-24 MED ORDER — SENNOSIDES-DOCUSATE SODIUM 8.6-50 MG PO TABS
1.0000 | ORAL_TABLET | Freq: Every evening | ORAL | Status: DC | PRN
  Administered 2017-07-25 – 2017-07-26 (×2): 1 via ORAL
  Filled 2017-07-24 (×2): qty 1

## 2017-07-24 MED ORDER — PROPOFOL 10 MG/ML IV BOLUS
INTRAVENOUS | Status: DC | PRN
  Administered 2017-07-24: 20 mg via INTRAVENOUS
  Administered 2017-07-24: 200 mg via INTRAVENOUS
  Administered 2017-07-24: 10 mg via INTRAVENOUS

## 2017-07-24 MED ORDER — PANTOPRAZOLE SODIUM 40 MG IV SOLR: 40.0000 mg | Freq: Every day | INTRAVENOUS | Status: DC

## 2017-07-24 MED ORDER — ZOLPIDEM TARTRATE 5 MG PO TABS: 5.0000 mg | ORAL_TABLET | Freq: Every evening | ORAL | Status: DC | PRN

## 2017-07-24 MED ORDER — SODIUM CHLORIDE 0.9 % IV SOLN: 250.0000 mL | INTRAVENOUS | Status: DC

## 2017-07-24 MED ORDER — ARTIFICIAL TEARS OPHTHALMIC OINT
TOPICAL_OINTMENT | OPHTHALMIC | Status: AC
  Filled 2017-07-24: qty 3.5

## 2017-07-24 MED ORDER — ACETAMINOPHEN 325 MG PO TABS
650.0000 mg | ORAL_TABLET | ORAL | Status: DC | PRN
Start: 2017-07-24 — End: 2017-07-26

## 2017-07-24 MED ORDER — BUPIVACAINE-EPINEPHRINE 0.25% -1:200000 IJ SOLN
INTRAMUSCULAR | Status: DC | PRN
  Administered 2017-07-24: 30 mL

## 2017-07-24 MED ORDER — MEPERIDINE HCL 25 MG/ML IJ SOLN: 6.2500 mg | INTRAMUSCULAR | Status: DC | PRN

## 2017-07-24 MED ORDER — MORPHINE SULFATE (PF) 4 MG/ML IV SOLN: 1.0000 mg | INTRAVENOUS | Status: DC | PRN

## 2017-07-24 MED ORDER — MIDAZOLAM HCL 2 MG/2ML IJ SOLN
INTRAMUSCULAR | Status: AC
  Filled 2017-07-24: qty 2

## 2017-07-24 MED ORDER — SODIUM CHLORIDE 0.9% FLUSH: 3.0000 mL | INTRAVENOUS | Status: DC | PRN

## 2017-07-24 MED ORDER — DEXAMETHASONE SODIUM PHOSPHATE 10 MG/ML IJ SOLN
INTRAMUSCULAR | Status: AC
  Filled 2017-07-24: qty 1

## 2017-07-24 MED ORDER — PHENYLEPHRINE 40 MCG/ML (10ML) SYRINGE FOR IV PUSH (FOR BLOOD PRESSURE SUPPORT)
PREFILLED_SYRINGE | INTRAVENOUS | Status: DC | PRN
  Administered 2017-07-24: 40 ug via INTRAVENOUS
  Administered 2017-07-24 (×4): 120 ug via INTRAVENOUS

## 2017-07-24 MED ORDER — 0.9 % SODIUM CHLORIDE (POUR BTL) OPTIME
TOPICAL | Status: DC | PRN
  Administered 2017-07-24: 1000 mL

## 2017-07-24 MED ORDER — ACETAMINOPHEN 10 MG/ML IV SOLN
INTRAVENOUS | Status: DC | PRN
  Administered 2017-07-24: 1000 mg via INTRAVENOUS

## 2017-07-24 MED ORDER — LIDOCAINE 2% (20 MG/ML) 5 ML SYRINGE
INTRAMUSCULAR | Status: AC
  Filled 2017-07-24: qty 5

## 2017-07-24 MED ORDER — SODIUM CHLORIDE 0.9% FLUSH
3.0000 mL | Freq: Two times a day (BID) | INTRAVENOUS | Status: DC
  Administered 2017-07-24 – 2017-07-25 (×2): 3 mL via INTRAVENOUS

## 2017-07-24 MED ORDER — BISACODYL 5 MG PO TBEC: 5.0000 mg | DELAYED_RELEASE_TABLET | Freq: Every day | ORAL | Status: DC | PRN

## 2017-07-24 MED ORDER — ONDANSETRON HCL 4 MG/2ML IJ SOLN
4.0000 mg | Freq: Four times a day (QID) | INTRAMUSCULAR | Status: DC | PRN
Start: 2017-07-24 — End: 2017-07-26

## 2017-07-24 MED ORDER — DOCUSATE SODIUM 100 MG PO CAPS
100.0000 mg | ORAL_CAPSULE | Freq: Two times a day (BID) | ORAL | Status: DC
  Administered 2017-07-24 – 2017-07-26 (×5): 100 mg via ORAL
  Filled 2017-07-24 (×6): qty 1

## 2017-07-24 MED ORDER — ONDANSETRON HCL 4 MG/2ML IJ SOLN
INTRAMUSCULAR | Status: AC
  Filled 2017-07-24: qty 2

## 2017-07-24 MED ORDER — SUCCINYLCHOLINE CHLORIDE 200 MG/10ML IV SOSY
PREFILLED_SYRINGE | INTRAVENOUS | Status: DC | PRN
  Administered 2017-07-24: 100 mg via INTRAVENOUS

## 2017-07-24 MED ORDER — BUPIVACAINE LIPOSOME 1.3 % IJ SUSP
20.0000 mL | INTRAMUSCULAR | Status: AC
  Administered 2017-07-24: 20 mL
  Filled 2017-07-24: qty 20

## 2017-07-24 MED FILL — Thrombin For Soln Kit 20000 Unit: CUTANEOUS | Qty: 1 | Status: AC

## 2017-07-24 SURGICAL SUPPLY — 94 items
APL SKNCLS STERI-STRIP NONHPOA (GAUZE/BANDAGES/DRESSINGS) ×1
BENZOIN TINCTURE PRP APPL 2/3 (GAUZE/BANDAGES/DRESSINGS) ×3 IMPLANT
BLADE CLIPPER SURG (BLADE) IMPLANT
BONE VIVIGEN FORMABLE 1.3CC (Bone Implant) ×6 IMPLANT
BUR PRESCISION 1.7 ELITE (BURR) ×1 IMPLANT
BUR ROUND FLUTED 5 RND (BURR) ×1 IMPLANT
BUR ROUND FLUTED 5MM RND (BURR) ×1
BUR ROUND PRECISION 4.0 (BURR) IMPLANT
BUR ROUND PRECISION 4.0MM (BURR)
BUR SABER RD CUTTING 3.0 (BURR) IMPLANT
BUR SABER RD CUTTING 3.0MM (BURR)
CARTRIDGE OIL MAESTRO DRILL (MISCELLANEOUS) ×1 IMPLANT
CLOSURE WOUND 1/2 X4 (GAUZE/BANDAGES/DRESSINGS) ×1
CONT SPEC 4OZ CLIKSEAL STRL BL (MISCELLANEOUS) ×2 IMPLANT
COVER SURGICAL LIGHT HANDLE (MISCELLANEOUS) ×1 IMPLANT
DIFFUSER DRILL AIR PNEUMATIC (MISCELLANEOUS) ×3 IMPLANT
DRAIN CHANNEL 15F RND FF W/TCR (WOUND CARE) ×1 IMPLANT
DRAPE C-ARM 42X72 X-RAY (DRAPES) ×3 IMPLANT
DRAPE C-ARMOR (DRAPES) ×2 IMPLANT
DRAPE ORTHO SPLIT 77X108 STRL (DRAPES) ×3
DRAPE POUCH INSTRU U-SHP 10X18 (DRAPES) ×3 IMPLANT
DRAPE SURG 17X23 STRL (DRAPES) ×12 IMPLANT
DRAPE SURG ORHT 6 SPLT 77X108 (DRAPES) ×1 IMPLANT
DRSG OPSITE POSTOP 4X6 (GAUZE/BANDAGES/DRESSINGS) ×2 IMPLANT
DURAPREP 26ML APPLICATOR (WOUND CARE) ×3 IMPLANT
ELECT BLADE 4.0 EZ CLEAN MEGAD (MISCELLANEOUS) ×3
ELECT CAUTERY BLADE 6.4 (BLADE) ×3 IMPLANT
ELECT REM PT RETURN 9FT ADLT (ELECTROSURGICAL) ×3
ELECTRODE BLDE 4.0 EZ CLN MEGD (MISCELLANEOUS) ×1 IMPLANT
ELECTRODE REM PT RTRN 9FT ADLT (ELECTROSURGICAL) ×1 IMPLANT
EVACUATOR SILICONE 100CC (DRAIN) IMPLANT
FILTER STRAW FLUID ASPIR (MISCELLANEOUS) ×3 IMPLANT
GAUZE SPONGE 4X4 16PLY XRAY LF (GAUZE/BANDAGES/DRESSINGS) ×5 IMPLANT
GLOVE BIO SURGEON STRL SZ7 (GLOVE) ×3 IMPLANT
GLOVE BIO SURGEON STRL SZ8 (GLOVE) ×3 IMPLANT
GLOVE BIOGEL PI IND STRL 7.0 (GLOVE) ×1 IMPLANT
GLOVE BIOGEL PI IND STRL 8 (GLOVE) ×1 IMPLANT
GLOVE BIOGEL PI INDICATOR 7.0 (GLOVE) ×2
GLOVE BIOGEL PI INDICATOR 8 (GLOVE) ×2
GOWN STRL REUS W/ TWL LRG LVL3 (GOWN DISPOSABLE) ×2 IMPLANT
GOWN STRL REUS W/ TWL XL LVL3 (GOWN DISPOSABLE) ×1 IMPLANT
GOWN STRL REUS W/TWL LRG LVL3 (GOWN DISPOSABLE) ×6
GOWN STRL REUS W/TWL XL LVL3 (GOWN DISPOSABLE) ×3
GRAFT BNE MATRIX VG FRMBL SM 1 (Bone Implant) IMPLANT
GUIDEWIRE BLUNT VIPER II 1.45 (WIRE) ×3 IMPLANT
GUIDEWIRE SHARP VIPER II (WIRE) ×14 IMPLANT
IV CATH 14GX2 1/4 (CATHETERS) ×3 IMPLANT
KIT BASIN OR (CUSTOM PROCEDURE TRAY) ×3 IMPLANT
KIT POSITION SURG JACKSON T1 (MISCELLANEOUS) ×3 IMPLANT
KIT ROOM TURNOVER OR (KITS) ×3 IMPLANT
MARKER SKIN DUAL TIP RULER LAB (MISCELLANEOUS) ×3 IMPLANT
NDL HYPO 25GX1X1/2 BEV (NEEDLE) ×1 IMPLANT
NDL JAMSHIDI VIPER (NEEDLE) IMPLANT
NDL SPNL 18GX3.5 QUINCKE PK (NEEDLE) ×2 IMPLANT
NEEDLE HYPO 25GX1X1/2 BEV (NEEDLE) ×3 IMPLANT
NEEDLE JAMSHIDI VIPER (NEEDLE) ×12 IMPLANT
NEEDLE SPNL 18GX3.5 QUINCKE PK (NEEDLE) ×3 IMPLANT
NS IRRIG 1000ML POUR BTL (IV SOLUTION) ×3 IMPLANT
OIL CARTRIDGE MAESTRO DRILL (MISCELLANEOUS) ×3
PACK LAMINECTOMY ORTHO (CUSTOM PROCEDURE TRAY) ×3 IMPLANT
PACK UNIVERSAL I (CUSTOM PROCEDURE TRAY) ×3 IMPLANT
PAD ARMBOARD 7.5X6 YLW CONV (MISCELLANEOUS) ×6 IMPLANT
PATTIES SURGICAL .5 X1 (DISPOSABLE) ×1 IMPLANT
PATTIES SURGICAL .5 X3 (DISPOSABLE) ×1 IMPLANT
PATTIES SURGICAL .5X1.5 (GAUZE/BANDAGES/DRESSINGS) ×1 IMPLANT
ROD VIPER LORD 5.5X110MM (Rod) ×2 IMPLANT
ROD VIPER PREBENT 100MM (Rod) ×2 IMPLANT
SCREW POLY VIPER2 7X40MM (Screw) ×4 IMPLANT
SCREW SET SINGLE INNER MIS (Screw) ×16 IMPLANT
SCREW XTAB POLY VIPER  7X45 (Screw) ×12 IMPLANT
SCREW XTAB POLY VIPER 7X45 (Screw) IMPLANT
SPONGE INTESTINAL PEANUT (DISPOSABLE) ×1 IMPLANT
SPONGE SURGIFOAM ABS GEL 100 (HEMOSTASIS) ×2 IMPLANT
STRIP CLOSURE SKIN 1/2X4 (GAUZE/BANDAGES/DRESSINGS) ×2 IMPLANT
STYLET INTUB SATIN SLIP 14FR (MISCELLANEOUS) ×3 IMPLANT
SURGIFLO W/THROMBIN 8M KIT (HEMOSTASIS) IMPLANT
SUT MNCRL AB 4-0 PS2 18 (SUTURE) ×9 IMPLANT
SUT VIC AB 0 CT1 18XCR BRD 8 (SUTURE) ×2 IMPLANT
SUT VIC AB 0 CT1 8-18 (SUTURE) ×9
SUT VIC AB 1 CT1 18XBRD ANBCTR (SUTURE) IMPLANT
SUT VIC AB 1 CT1 18XCR BRD 8 (SUTURE) ×2 IMPLANT
SUT VIC AB 1 CT1 8-18 (SUTURE) ×9
SUT VIC AB 2-0 CT2 18 VCP726D (SUTURE) ×12 IMPLANT
SYR 20CC LL (SYRINGE) ×3 IMPLANT
SYR BULB IRRIGATION 50ML (SYRINGE) ×3 IMPLANT
SYR CONTROL 10ML LL (SYRINGE) ×3 IMPLANT
SYR TB 1ML LUER SLIP (SYRINGE) ×3 IMPLANT
TAP CANN VIPER2 DL 5.0 (TAP) ×4 IMPLANT
TAP CANN VIPER2 DL 6.0 (TAP) ×4 IMPLANT
TOWEL OR 17X24 6PK STRL BLUE (TOWEL DISPOSABLE) ×3 IMPLANT
TOWEL OR 17X26 10 PK STRL BLUE (TOWEL DISPOSABLE) ×3 IMPLANT
TRAY FOLEY W/METER SILVER 16FR (SET/KITS/TRAYS/PACK) ×1 IMPLANT
WATER STERILE IRR 1000ML POUR (IV SOLUTION) ×3 IMPLANT
YANKAUER SUCT BULB TIP NO VENT (SUCTIONS) ×3 IMPLANT

## 2017-07-24 NOTE — Op Note (Signed)
NAME:  Anthony Campos, Anthony Campos NO.:  [id number]  MEDICAL RECORD NO.:  [id number]  LOCATION:  [id number]                        FACILITY:  [facility]  PHYSICIAN:  Phylliss Bob, MD      DATE OF BIRTH:  1956-05-21  DATE OF PROCEDURE:  07/24/2017                              OPERATIVE REPORT   PREOPERATIVE DIAGNOSES:  Lumbar degenerative scoliosis and spinal stenosis, status post anterior procedures on July 23, 2017.  The patient did present today for stage 2 of his procedure.  POSTOPERATIVE DIAGNOSES:  Lumbar degenerative scoliosis and spinal stenosis, status post anterior procedures on July 23, 2017.  The patient did present today for stage 2 of his procedure.  PROCEDURES: 1. Posterior spinal fusion at L3-4, L4-5, L5-S1. 2. Placement of posterior segmental instrumentation bilaterally, L3-     S1. 3. Use of morselized allograft - ViviGen. 4. Intraoperative use of fluoroscopy.  SURGEON:  Phylliss Bob, MD.  ASSISTANTPricilla Holm, PA-C.  ANESTHESIA:  General endotracheal anesthesia.  COMPLICATIONS:  None.  DISPOSITION:  Stable.  ESTIMATED BLOOD LOSS:  Minimal.  INDICATIONS FOR SURGERY:  Briefly, Anthony Campos is a pleasant 62 year old male, who did present yesterday for stage I of what was to be a 2-stage procedure.  Please refer to my operative report on July 23, 2017, for additional details.  Again, he did present today for stage 2 of his procedure.  OPERATIVE DETAILS:  On July 24, 2017, the patient was brought to Surgery and general endotracheal anesthesia was administered.  The patient was placed prone on a well-padded flat Jackson bed with a spinal frame.  Antibiotics were given and a time-out procedure was performed. The patient's back was prepped and draped in the usual sterile fashion. At this point, the vertebral bodies and pedicles were identified using AP and lateral fluoroscopy.  I then made 2 paramedian incisions just lateral to the pedicles  on the right and left sides.  Liberally using AP and lateral fluoroscopy, I did cannulate the pedicles bilaterally at L3, L4, L5, and S1.  Guidewires were placed into the cannulated pedicles.  I then exposed the posterior elements and left-sided facet joints at L3-4, L4-5, and L5-S1.  A high-speed bur was used to decorticate the posterior elements and posterolateral gutter.  I then liberally packed ViviGen into the facet joints and posterolateral gutters, to help aid in the success of the fusion.  At this point, I did advance 6-mm taps over the guidewires at L3, L4, L5, and S1 bilaterally.  I did use triggered EMG to test each of the taps to ensure that the taps were not adjacent to any neurologic structures.  The taps tested well above 15 milliamps.  At this point, 7 x 45 mm screws were placed bilaterally at L3, L4, and L5 and 7 x 40 mm screws were placed bilaterally at S1.  Rods were then secured into the tulip heads of the screws bilaterally.  Caps were then placed.  A compression maneuver was performed on the right side, given the patient's scoliotic deformity.  A final locking procedure was then performed across the caps from L3-S1.  I was very pleased with the final AP  and lateral fluoroscopic images.  The wound was then copiously irrigated and closed in layers using #1 Vicryl followed by 2-0 Vicryl followed by 4-0 Monocryl.  Benzoin and Steri-Strips were applied followed by sterile dressing.  All instrument counts were correct at the termination of the procedure.  Of note, Pricilla Holm was my assistant throughout surgery, and did aid in retraction, suctioning, and closure from start to finish.     Phylliss Bob, MD     MD/MEDQ  D:  07/24/2017  T:  07/24/2017  Job:  [id number]

## 2017-07-24 NOTE — H&P (Signed)
Patient tolerated yesterday's procedure well, and presents today for stage 2 (L2-S1 PSF). Will proceed as planned.

## 2017-07-24 NOTE — Op Note (Signed)
NAME:  NYCERE, PRESLEY NO.:  [id number]  MEDICAL RECORD NO.:  [id number]  LOCATION:  [id number]                        FACILITY:  [facility]  PHYSICIAN:  Phylliss Bob, MD      DATE OF BIRTH:  12-14-1955  DATE OF PROCEDURE:  07/23/2017                              OPERATIVE REPORT   PREOPERATIVE DIAGNOSES: 1. Lumbar spinal stenosis. 2. Lumbar degenerative scoliosis. 3. Bilateral leg pain and weakness.  POSTOPERATIVE DIAGNOSES: 1. Lumbar spinal stenosis. 2. Lumbar degenerative scoliosis. 3. Bilateral leg pain and weakness.  PROCEDURES: 1. Anterior lumbar interbody fusion, via an anterior retroperitoneal     approach, L4-5, L5-S1. 2. Anterior lumbar interbody fusion via a direct left-sided lateral     approach, L3-4. 3. Insertion of interbody device x3. 4. Placement of anterior instrumentation, securing the L4-5 and L5-S1     intervertebral segments. 5. Intraoperative use of fluoroscopy. 6. Use of morselized allograft.  ASSISTANT:  Pricilla Holm, PA-C.  ANESTHESIA:  General endotracheal anesthesia.  COMPLICATIONS:  None.  DISPOSITION:  Stable.  ESTIMATED BLOOD LOSS:  100 mL.  INDICATIONS FOR SURGERY:  Briefly, Mr. Cappiello is a very pleasant 62 year old male, who did present to me with ongoing and progressive pain and weakness in his bilateral legs.  The patient's MRI and x-rays did reveal the findings noted above.  The patient did continue to have ongoing pain despite appropriate conservative treatment measures.  Given his ongoing pain, we did discuss proceeding with the procedure reflected above.  The patient was fully aware of the risks and limitations of surgery and did wish to proceed.  OPERATIVE DETAILS:  On July 23, 2017, the patient was brought to surgery and general endotracheal anesthesia was administered.  Of note, a catheter was attempted to be placed by the operating room circulating nurse.  The catheter was inserted approximately 70% to  80% of the length of the catheter, and a firm endpoint was identified and no urine was identified in the catheter.  An additional attempt was made and again, no urine was noted.  At this point, we did not proceed any further, and we did consult Urology, and did await a urologist to enter into the room for additional assistance with regard to the catheter.  Ultimately, Dr. Diona Fanti did enter the room.  He did perform a cystoscopy.  In the process of his cystoscopy, he did identify a false passage, and he did attempt to find the proper urethra, but was not able to find the urethra and was not able to advance a catheter into the bladder.  He then proceeded with placement of a suprapubic catheter.  The catheter was then stitched to the skin.  We then proceeded with the procedure.    At this point, the abdomen was prepped and draped in the usual sterile fashion.  A time-out procedure was performed.  An anterior retroperitoneal approach was then performed by Dr. Curt Jews.  The anterior spine was noted.  My attention was then turned to the L5-S1 intervertebral space.  I then proceeded with a thorough and complete L5- S1 intervertebral diskectomy, to the level of the posterior longitudinal ligament.  The endplates were then  prepared and a 14 mm large intervertebral spacer, with 10 degrees of lordosis, was packed with ViviGen and tamped into position.  I was very pleased with the press-fit of the implant.  I then proceeded with the anterior instrumentation portion of the procedure.  Using a rongeur, I did remove the superior and anterior aspect of the S1 overhang.  The S1 vertebral body was then cannulated and a cannulated screw with a buttress device posteriorly was advanced into the S1 vertebral body, securing the L5-S1 intervertebral space.  At this point, Dr. Donnetta Hutching then scrubbed back into the case and did expose the L4-5 level.  I then proceeded again with a thorough and complete L4-5  intervertebral diskectomy.  I was very pleased with the diskectomy that I was able to accomplish.  I then placed a series of intervertebral spacer trials and I did feel that a 12 mm large, 5 degree lordotic, spacer would be the most appropriate fit.  The appropriate spacer was then packed with ViviGen and tamped into position.  I was very pleased with the press-fit of the implant.  I then cannulated the upper and anterior aspect of the L5 vertebral body and again, the cannulated screw with a posterior fixation device was advanced until into the L5 vertebral body, securing the L4-5 intervertebral space.  The wound was then irrigated and closed in layers using #1 PDS, followed by 0 Vicryl, followed by 2-0 Vicryl, followed by 4-0 Monocryl.  Benzoin and Steri-Strips were applied, followed by a sterile dressing.  At this point, the patient was then rolled into the lateral decubitus position with the left side up.  The patient's left flank was then prepped and draped in the usual sterile fashion and once again, a time-out procedure was performed.  I then made a transverse incision just below the patient's ribcage, just above the iliac crest, in line with the L3-4 intervertebral space.  The external and internal oblique musculature were dissected and the retroperitoneal space was entered.  The peritoneum was bluntly swept anteriorly and the psoas muscle was readily noted.  I then placed a series of dilators through the psoas, docked over the L3-4 intervertebral space.  Neurologic monitoring did confirm that there were no neurologic structures in the immediate vicinity of the dilators.  A self-retaining retractor was then placed over the dilators and secured to the bed using a rigid arm.  Through the retractor, I did perform a thorough and complete L3-4 intervertebral diskectomy.  The contralateral annulus was released and the endplates were prepared.  I did feel that a 10 mm spacer would be the  most appropriate fit.  A 10 x 55 x 18 mm NuVasive intervertebral spacer was then packed with ViviGen and tamped into position.  I was pleased with the press fit, and I was very pleased with the final AP and lateral fluoroscopic images.  The wound was then irrigated and closed in layers using #1 Vicryl, followed by 2-0 Vicryl, followed by 4-0 Monocryl. Benzoin and Steri-Strips were then applied, followed by sterile dressing.  All instrument counts were correct at the termination of the procedure.  Of note, Pricilla Holm was my assistant throughout the surgery, and did aid in retraction, suctioning and closure from start to finish.     Phylliss Bob, MD   ______________________________ Phylliss Bob, MD    MD/MEDQ  D:  07/23/2017  T:  07/24/2017  Job:  [id number]

## 2017-07-24 NOTE — Evaluation (Signed)
Physical Therapy Evaluation Patient Details Name: Anthony Campos MRN: [id number] DOB: 1955-10-13 Today's Date: 07/24/2017   History of Present Illness  Pt is a 62 y/o male who presents s/p L2-S1 PLIF on 07/24/17. He underwent L4-S1 ALIF, L3-L4 lateral approach on 07/23/17.  Clinical Impression  Pt admitted with above diagnosis. Pt currently with functional limitations due to the deficits listed below (see PT Problem List). At the time of PT eval pt pleasant and willing to participate. He was able to ambulate ~75' with RW and close guard for safety. Pt was educated on precautions and brace application. He will have 24 hour assist upon d/c from wife and family. Pt will benefit from skilled PT to increase their independence and safety with mobility to allow discharge to the venue listed below.       Follow Up Recommendations No PT follow up;Supervision for mobility/OOB(MD to order PT when appropriate per post-op protocol)    Equipment Recommendations  Rolling walker with 5" wheels;3in1 (PT)    Recommendations for Other Services       Precautions / Restrictions Precautions Precautions: Fall;Back Precaution Booklet Issued: Yes (comment) Precaution Comments: Reviewed verbally and provided handout at the end of the session Required Braces or Orthoses: Spinal Brace Spinal Brace: Thoracolumbosacral orthotic;Applied in sitting position Restrictions Weight Bearing Restrictions: No      Mobility  Bed Mobility Overal bed mobility: Needs Assistance Bed Mobility: Rolling;Sidelying to Sit Rolling: Min guard Sidelying to sit: Min assist       General bed mobility comments: VC's for log roll technique. Pt was able to transition to EOB with min assist for trunk elevation.   Transfers Overall transfer level: Needs assistance Equipment used: None Transfers: Sit to/from Stand Sit to Stand: Min assist         General transfer comment: Assist for balance. Pt was able to power-up to full stand  without assistance. Increased time required.   Ambulation/Gait Ambulation/Gait assistance: Min assist;Min guard Ambulation Distance (Feet): 75 Feet Assistive device: 1 person hand held assist;Rolling walker (2 wheeled) Gait Pattern/deviations: Step-through pattern;Decreased stride length;Trunk flexed;Narrow base of support Gait velocity: Decreased Gait velocity interpretation: Below normal speed for age/gender General Gait Details: Initially with HHA and IV pole - requiring min assist for balance and safety. Pt was able to progress to min guard assist with RW.  Stairs            Wheelchair Mobility    Modified Rankin (Stroke Patients Only)       Balance Overall balance assessment: Needs assistance Sitting-balance support: Feet supported;No upper extremity supported Sitting balance-Leahy Scale: Fair     Standing balance support: Bilateral upper extremity supported;During functional activity Standing balance-Leahy Scale: Poor Standing balance comment: Reliant on UE support for balance                             Pertinent Vitals/Pain Pain Assessment: Faces Faces Pain Scale: Hurts little more Pain Location: Back and abdomen Pain Descriptors / Indicators: Operative site guarding;Discomfort Pain Intervention(s): Limited activity within patient's tolerance;Monitored during session;Repositioned    Home Living Family/patient expects to be discharged to:: Private residence Living Arrangements: Spouse/significant other Available Help at Discharge: Family;Available 24 hours/day Type of Home: House Home Access: Stairs to enter Entrance Stairs-Rails: Psychiatric nurse of Steps: 5 Home Layout: Two level;1/2 bath on main level;Able to live on main level with bedroom/bathroom Home Equipment: None      Prior Function Level of  Independence: Independent               Hand Dominance        Extremity/Trunk Assessment   Upper Extremity  Assessment Upper Extremity Assessment: Overall WFL for tasks assessed    Lower Extremity Assessment Lower Extremity Assessment: Generalized weakness(Consistent with pre-op diagnosis)    Cervical / Trunk Assessment Cervical / Trunk Assessment: Other exceptions Cervical / Trunk Exceptions: Forward head posture; s/p surgery  Communication   Communication: No difficulties  Cognition Arousal/Alertness: Awake/alert Behavior During Therapy: WFL for tasks assessed/performed Overall Cognitive Status: Within Functional Limits for tasks assessed                                        General Comments      Exercises     Assessment/Plan    PT Assessment Patient needs continued PT services  PT Problem List Decreased strength;Decreased range of motion;Decreased activity tolerance;Decreased balance;Decreased mobility;Decreased knowledge of use of DME;Decreased safety awareness;Decreased knowledge of precautions;Pain       PT Treatment Interventions DME instruction;Gait training;Stair training;Functional mobility training;Therapeutic activities;Therapeutic exercise;Neuromuscular re-education;Patient/family education    PT Goals (Current goals can be found in the Care Plan section)  Acute Rehab PT Goals Patient Stated Goal: Return home at d/c PT Goal Formulation: With patient/family Time For Goal Achievement: 08/07/17 Potential to Achieve Goals: Good    Frequency Min 5X/week   Barriers to discharge        Co-evaluation               AM-PAC PT "6 Clicks" Daily Activity  Outcome Measure Difficulty turning over in bed (including adjusting bedclothes, sheets and blankets)?: Unable Difficulty moving from lying on back to sitting on the side of the bed? : Unable Difficulty sitting down on and standing up from a chair with arms (e.g., wheelchair, bedside commode, etc,.)?: Unable Help needed moving to and from a bed to chair (including a wheelchair)?: A Little Help  needed walking in hospital room?: A Little Help needed climbing 3-5 steps with a railing? : A Little 6 Click Score: 12    End of Session Equipment Utilized During Treatment: Gait belt;Back brace Activity Tolerance: Patient tolerated treatment well Patient left: in chair;with call bell/phone within reach;with family/visitor present Nurse Communication: Mobility status PT Visit Diagnosis: Unsteadiness on feet (R26.81);Pain;Other symptoms and signs involving the nervous system (R29.898) Pain - part of body: (back/abdomen)    Time: 1610-9604 PT Time Calculation (min) (ACUTE ONLY): 43 min   Charges:   PT Evaluation $PT Eval Moderate Complexity: 1 Mod PT Treatments $Gait Training: 23-37 mins   PT G Codes:        Rolinda Roan, PT, DPT Acute Rehabilitation Services Pager: [phone]   Thelma Comp 07/24/2017, 2:50 PM

## 2017-07-24 NOTE — Progress Notes (Signed)
Patient ID: Anthony Campos, male   DOB: 1956/05/30, 62 y.o.   MRN: [id number] Patient is in OR holding for posterior approach fusion this morning. Reports soreness in his abdomen.  No nausea or vomiting. Abdomen soft.  2+ dorsalis pedis pulses bilaterally. From his anterior approach.  We will not follow actively.  Please let us know if we can provide any assistance.

## 2017-07-24 NOTE — Anesthesia Procedure Notes (Addendum)
Procedure Name: Intubation Date/Time: 07/24/2017 7:43 AM Performed by: Harden Mo, CRNA Pre-anesthesia Checklist: Patient identified, Emergency Drugs available, Suction available and Patient being monitored Patient Re-evaluated:Patient Re-evaluated prior to induction Oxygen Delivery Method: Circle System Utilized Preoxygenation: Pre-oxygenation with 100% oxygen Induction Type: IV induction and Rapid sequence Ventilation: Mask ventilation without difficulty Laryngoscope Size: Glidescope and 4 Grade View: Grade I Tube type: Oral Tube size: 7.5 mm Number of attempts: 1 Airway Equipment and Method: Stylet,  Oral airway and Video-laryngoscopy Placement Confirmation: ETT inserted through vocal cords under direct vision,  positive ETCO2 and breath sounds checked- equal and bilateral Secured at: 23 cm Tube secured with: Tape Dental Injury: Teeth and Oropharynx as per pre-operative assessment  Comments: Difficult intubation per previous anesthesia records. Glidescope utilized first attempt with no complications. Intubation by Yaakov Guthrie, SRNA.

## 2017-07-24 NOTE — Progress Notes (Signed)
I came by to check on pt this am--he is in OR. I'll check back tomorrow.

## 2017-07-24 NOTE — Anesthesia Postprocedure Evaluation (Signed)
Anesthesia Post Note  Patient: Francis Doenges  Procedure(s) Performed: POSTERIOR LUMBAR FUSION LUMBAR THREE TO SACRAL ONE (N/A )     Patient location during evaluation: PACU Anesthesia Type: General Level of consciousness: awake and alert Pain management: pain level controlled Vital Signs Assessment: post-procedure vital signs reviewed and stable Respiratory status: spontaneous breathing, nonlabored ventilation, respiratory function stable and patient connected to nasal cannula oxygen Cardiovascular status: blood pressure returned to baseline and stable Postop Assessment: no apparent nausea or vomiting Anesthetic complications: no    Last Vitals:  Vitals:   07/24/17 1243 07/24/17 1310  BP: (!) 144/94 (!) 147/87  Pulse: 88 89  Resp: 14 16  Temp: 36.7 C 36.5 C  SpO2: 95% 94%    Last Pain:  Vitals:   07/24/17 1448  TempSrc:   PainSc: 6                  Barnet Glasgow

## 2017-07-24 NOTE — Anesthesia Preprocedure Evaluation (Signed)
Anesthesia Evaluation  Patient identified by MRN, date of birth, ID band Patient awake    Reviewed: Allergy & Precautions, NPO status , Patient's Chart, lab work & pertinent test results  Airway Mallampati: III       Dental no notable dental hx. (+) Poor Dentition, Teeth Intact   Pulmonary neg pulmonary ROS, former smoker,    Pulmonary exam normal        Cardiovascular hypertension, negative cardio ROS   Rhythm:Regular Rate:Normal     Neuro/Psych negative neurological ROS  negative psych ROS   GI/Hepatic negative GI ROS, Neg liver ROS,   Endo/Other  negative endocrine ROS  Renal/GU negative Renal ROS  negative genitourinary   Musculoskeletal negative musculoskeletal ROS (+)   Abdominal   Peds  Hematology negative hematology ROS (+)   Anesthesia Other Findings   Reproductive/Obstetrics                             Lab Results  Component Value Date   WBC 6.6 07/21/2017   HGB 13.9 07/21/2017   HCT 41.3 07/21/2017   MCV 89.4 07/21/2017   PLT 208 07/21/2017   Lab Results  Component Value Date   CREATININE 1.01 07/21/2017   BUN 20 07/21/2017   NA 138 07/21/2017   K 4.1 07/21/2017   CL 104 07/21/2017   CO2 24 07/21/2017    Anesthesia Physical  Anesthesia Plan  ASA: II  Anesthesia Plan: General   Post-op Pain Management:    Induction: Intravenous  PONV Risk Score and Plan: Treatment may vary due to age or medical condition, Dexamethasone and Ondansetron  Airway Management Planned: Oral ETT  Additional Equipment:   Intra-op Plan:   Post-operative Plan: Extubation in OR  Informed Consent: I have reviewed the patients History and Physical, chart, labs and discussed the procedure including the risks, benefits and alternatives for the proposed anesthesia with the patient or authorized representative who has indicated his/her understanding and acceptance.   Dental  advisory given  Plan Discussed with: CRNA  Anesthesia Plan Comments:         Anesthesia Quick Evaluation

## 2017-07-24 NOTE — Progress Notes (Signed)
Paged Urologists Dr Diona Fanti regarding suprapubic catheter. Informed by OR nurse upon turning patient after surgery, 2 sutures were removed from catheter inadvertently. OR nurse taped suprapubic catheter in place temporarily. Attempted to page Urologist in order to notify of the suture removal and get further plan. No return page.  12:03pm- 2nd attempt to page Urologist-no return call.

## 2017-07-24 NOTE — Progress Notes (Signed)
Orthopedic Tech Progress Note Patient Details:  Anthony Campos 20-Aug-1955 [id number]  Patient ID: Anthony Campos, male   DOB: 1956/03/11, 62 y.o.   MRN: [id number]   [name] 07/24/2017, 10:39 AMCalled [employer] for TLSO brace.

## 2017-07-24 NOTE — Plan of Care (Signed)
  Progressing Education: Knowledge of General Education information will improve 07/24/2017 2023 - Progressing by Charlena Cross, RN Health Behavior/Discharge Planning: Ability to manage health-related needs will improve 07/24/2017 2023 - Progressing by Pricilla Holm, Sheridyn Canino D, RN Clinical Measurements: Ability to maintain clinical measurements within normal limits will improve 07/24/2017 2023 - Progressing by Charlena Cross, RN Will remain free from infection 07/24/2017 2023 - Progressing by Margot Chimes D, RN Diagnostic test results will improve 07/24/2017 2023 - Progressing by Margot Chimes D, RN Respiratory complications will improve 07/24/2017 2023 - Progressing by Charlena Cross, RN Cardiovascular complication will be avoided 07/24/2017 2023 - Progressing by Margot Chimes D, RN Activity: Risk for activity intolerance will decrease 07/24/2017 2023 - Progressing by Margot Chimes D, RN Nutrition: Adequate nutrition will be maintained 07/24/2017 2023 - Progressing by Margot Chimes D, RN Coping: Level of anxiety will decrease 07/24/2017 2023 - Progressing by Charlena Cross, RN Elimination: Will not experience complications related to bowel motility 07/24/2017 2023 - Progressing by Charlena Cross, RN Will not experience complications related to urinary retention 07/24/2017 2023 - Progressing by Margot Chimes D, RN Pain Managment: General experience of comfort will improve 07/24/2017 2023 - Progressing by Margot Chimes D, RN Safety: Ability to remain free from injury will improve 07/24/2017 2023 - Progressing by Pricilla Holm, Cris Gibby D, RN Skin Integrity: Risk for impaired skin integrity will decrease 07/24/2017 2023 - Progressing by Charlena Cross, RN Activity: Ability to avoid complications of mobility impairment will improve 07/24/2017 2023 - Progressing by Pricilla Holm, Janila Arrazola D, RN Ability to tolerate increased activity will improve 07/24/2017 2023 - Progressing by Charlena Cross, RN Will remain  free from falls 07/24/2017 2023 - Progressing by Margot Chimes D, RN Bowel/Gastric: Gastrointestinal status for postoperative course will improve 07/24/2017 2023 - Progressing by Charlena Cross, RN Education: Ability to verbalize activity precautions or restrictions will improve 07/24/2017 2023 - Progressing by Margot Chimes D, RN Knowledge of the prescribed therapeutic regimen will improve 07/24/2017 2023 - Progressing by Charlena Cross, RN Understanding of discharge needs will improve 07/24/2017 2023 - Progressing by Charlena Cross, RN Physical Regulation: Ability to maintain clinical measurements within normal limits will improve 07/24/2017 2023 - Progressing by Pricilla Holm, Odelia Graciano D, RN Postoperative complications will be avoided or minimized 07/24/2017 2023 - Progressing by Charlena Cross, RN Diagnostic test results will improve 07/24/2017 2023 - Progressing by Margot Chimes D, RN Pain Management: Pain level will decrease 07/24/2017 2023 - Progressing by Charlena Cross, RN Skin Integrity: Signs of wound healing will improve 07/24/2017 2023 - Progressing by Margot Chimes D, RN Health Behavior/Discharge Planning: Identification of resources available to assist in meeting health care needs will improve 07/24/2017 2023 - Progressing by Margot Chimes D, RN Bladder/Genitourinary: Urinary functional status for postoperative course will improve 07/24/2017 2023 - Progressing by Charlena Cross, RN

## 2017-07-24 NOTE — Transfer of Care (Signed)
Immediate Anesthesia Transfer of Care Note  Patient: Zollie Ellery  Procedure(s) Performed: POSTERIOR LUMBAR FUSION LUMBAR THREE TO SACRAL ONE (N/A )  Patient Location: PACU  Anesthesia Type:General  Level of Consciousness: awake, alert  and oriented  Airway & Oxygen Therapy: Patient Spontanous Breathing and Patient connected to nasal cannula oxygen  Post-op Assessment: Report given to RN, Post -op Vital signs reviewed and stable and Patient moving all extremities X 4  Post vital signs: Reviewed and stable  Last Vitals:  Vitals:   07/24/17 0124 07/24/17 0558  BP: 101/66 (!) 92/53  Pulse: (!) 129 (!) 110  Resp: 20 20  Temp: 36.7 C 37.4 C  SpO2: 99% 97%    Last Pain:  Vitals:   07/24/17 0629  TempSrc:   PainSc: 7       Patients Stated Pain Goal: 2 (68/12/75 1700)  Complications: No apparent anesthesia complications

## 2017-07-25 ENCOUNTER — Encounter (HOSPITAL_COMMUNITY): Payer: Self-pay | Admitting: Orthopedic Surgery

## 2017-07-25 MED ORDER — MAGNESIUM CITRATE PO SOLN
1.0000 | Freq: Once | ORAL | Status: AC
  Administered 2017-07-25: 1 via ORAL
  Filled 2017-07-25: qty 296

## 2017-07-25 MED FILL — Heparin Sodium (Porcine) Inj 1000 Unit/ML: INTRAMUSCULAR | Qty: 60 | Status: AC

## 2017-07-25 MED FILL — Sodium Chloride IV Soln 0.9%: INTRAVENOUS | Qty: 2000 | Status: AC

## 2017-07-25 NOTE — Progress Notes (Signed)
    Patient doing well  Pain has been minimal Has been ambulating   Physical Exam: Vitals:   07/24/17 2335 07/25/17 0400  BP: 107/68 113/67  Pulse: 83 (!) 125  Resp: 20 18  Temp: (!) 100.7 F (38.2 C) 99.6 F (37.6 C)  SpO2: 100% 97%    Dressing in place NVI  Pt s/p L3-S1 A/P fusion, doing well  - up with PT/OT, encourage ambulation - Percocet for pain, Valium for muscle spasms - likely d/c home today with f/u in 2 weeks  - patient will need to follow-up with Dr. Diona Fanti for foley/urethethral management

## 2017-07-25 NOTE — Progress Notes (Signed)
Physical Therapy Treatment Patient Details Name: Anthony Campos MRN: [id number] DOB: [birth year] Today's Date: 07/25/2017    History of Present Illness Pt is a 62 y/o male who presents s/p L2-S1 PLIF on 07/24/17. He underwent L4-S1 ALIF, L3-L4 lateral approach on 07/23/17.    PT Comments    Pt progressing towards physical therapy goals. Stair training initiated this session however pt requiring min-mod assist for safe completion. Pt reports he has ~200' walk to his front porch which is also on a hill, and does not feel comfortable attempting this this afternoon if he were to d/c. Pt and wife asking for another night and PT session prior to returning home. I do feel that from a safety standpoint, pt would benefit from another PT session to practice stairs again and reinforce safety with mobility. RN notified. Will continue to follow and progress as able per POC.   Follow Up Recommendations  No PT follow up;Supervision for mobility/OOB(MD to order PT when appropriate per post-op protocol)     Equipment Recommendations  Rolling walker with 5" wheels;3in1 (PT)    Recommendations for Other Services       Precautions / Restrictions Precautions Precautions: Fall;Back Precaution Booklet Issued: Yes (comment) Precaution Comments: Reviewed verbally and provided handout at the end of the session Required Braces or Orthoses: Spinal Brace Spinal Brace: Thoracolumbosacral orthotic;Applied in sitting position Restrictions Weight Bearing Restrictions: No    Mobility  Bed Mobility Overal bed mobility: Needs Assistance Bed Mobility: Rolling;Sidelying to Sit;Sit to Sidelying Rolling: Supervision Sidelying to sit: Min assist     Sit to sidelying: Min guard General bed mobility comments: Assist for shoulder elevation to full sitting position. Increased time and considerable effort to complete transitions.  Transfers Overall transfer level: Needs assistance Equipment used: Rolling walker (2  wheeled) Transfers: Sit to/from Stand Sit to Stand: Min assist         General transfer comment: Assist for controlled descent to low EOB. Pt was cued for hand placement on seated surface for safety, and a wide BOS to aide in controlled descent to chair. Increased time required for power-up to full stand with assist for balance support.  Ambulation/Gait Ambulation/Gait assistance: Min guard Ambulation Distance (Feet): 175 Feet Assistive device: Rolling walker (2 wheeled) Gait Pattern/deviations: Step-through pattern;Decreased stride length;Trunk flexed Gait velocity: Decreased Gait velocity interpretation: Below normal speed for age/gender General Gait Details: VC's for improved posture. Pt fatiguing quicker than earlier session but pushing forward to try and make it to the stairs.   Stairs Stairs: Yes   Stair Management: One rail Left;Step to pattern;Sideways Number of Stairs: 5 General stair comments: Initially attempted leading with LLE and required heavy min assist to light moderate assist to complete stairs. When leading with the RLE holding the L railing pt was able to complete with min assist.   Wheelchair Mobility    Modified Rankin (Stroke Patients Only)       Balance Overall balance assessment: Needs assistance Sitting-balance support: Feet supported;No upper extremity supported Sitting balance-Leahy Scale: Fair     Standing balance support: Bilateral upper extremity supported;During functional activity Standing balance-Leahy Scale: Poor Standing balance comment: Reliant on UE support for balance                            Cognition Arousal/Alertness: Awake/alert Behavior During Therapy: WFL for tasks assessed/performed Overall Cognitive Status: Within Functional Limits for tasks assessed  Exercises      General Comments        Pertinent Vitals/Pain Pain Assessment: Faces Faces Pain  Scale: Hurts even more Pain Location: Back and abdomen Pain Descriptors / Indicators: Operative site guarding;Discomfort Pain Intervention(s): Limited activity within patient's tolerance;Monitored during session;Repositioned    Home Living                      Prior Function            PT Goals (current goals can now be found in the care plan section) Acute Rehab PT Goals Patient Stated Goal: Return home at d/c PT Goal Formulation: With patient/family Time For Goal Achievement: 08/07/17 Potential to Achieve Goals: Good Progress towards PT goals: Progressing toward goals    Frequency    Min 5X/week      PT Plan Current plan remains appropriate    Co-evaluation              AM-PAC PT "6 Clicks" Daily Activity  Outcome Measure  Difficulty turning over in bed (including adjusting bedclothes, sheets and blankets)?: Unable Difficulty moving from lying on back to sitting on the side of the bed? : Unable Difficulty sitting down on and standing up from a chair with arms (e.g., wheelchair, bedside commode, etc,.)?: Unable Help needed moving to and from a bed to chair (including a wheelchair)?: A Little Help needed walking in hospital room?: A Little Help needed climbing 3-5 steps with a railing? : A Little 6 Click Score: 12    End of Session Equipment Utilized During Treatment: Gait belt;Back brace Activity Tolerance: Patient tolerated treatment well Patient left: in chair;with call bell/phone within reach;with family/visitor present Nurse Communication: Mobility status PT Visit Diagnosis: Unsteadiness on feet (R26.81);Pain;Other symptoms and signs involving the nervous system (R29.898) Pain - part of body: (back/abdomen)     Time: 6384-6659 PT Time Calculation (min) (ACUTE ONLY): 34 min  Charges:  $Gait Training: 23-37 mins                    G Codes:       Rolinda Roan, PT, DPT Acute Rehabilitation Services Pager: [place] 07/25/2017, 3:12 PM

## 2017-07-25 NOTE — Evaluation (Signed)
Occupational Therapy Evaluation Patient Details Name: Anthony Campos MRN: [id number] DOB: 1956-01-11 Today's Date: 07/25/2017    History of Present Illness Pt is a 62 y/o male who presents s/p L2-S1 PLIF on 07/24/17. He underwent L4-S1 ALIF, L3-L4 lateral approach on 07/23/17.   Clinical Impression   Patient is s/p L2-s1  And ALIF L4-s1 surgery resulting in functional limitations due to the deficits listed below (see OT problem list). Pt currently requires (A) to complete bed mobility and total (A) for LB adls. Pt and wife educated on couch modifications for sleeping and transfers.  Patient will benefit from skilled OT acutely to increase independence and safety with ADLS to allow discharge [place] with RW. Pt could benefit from extended acute stay to allow for continued education and progression with ability to power up into standing with practice.     Follow Up Recommendations  Home health OT    Equipment Recommendations  3 in 1 bedside commode;Other (comment)(rw)    Recommendations for Other Services       Precautions / Restrictions Precautions Precautions: Fall;Back Precaution Booklet Issued: Yes (comment) Precaution Comments: reviewed back precautions for adls with handout present Required Braces or Orthoses: Spinal Brace Spinal Brace: Thoracolumbosacral orthotic;Applied in sitting position Restrictions Weight Bearing Restrictions: No      Mobility Bed Mobility Overal bed mobility: Needs Assistance Bed Mobility: Rolling;Supine to Sit Rolling: Mod assist Sidelying to sit: Min assist Supine to sit: Mod assist   Sit to sidelying: Min guard General bed mobility comments: pt requires (A) for sequences and to elevate trunk from bed surface. Advise next session to have wife (A) if possible to educate them together  Transfers Overall transfer level: Needs assistance Equipment used: Rolling walker (2 wheeled) Transfers: Sit to/from Stand Sit to Stand: Mod assist          General transfer comment: pt unable to power up without (A) . pt attempting twice prior to thearpist    Balance Overall balance assessment: Needs assistance Sitting-balance support: Feet supported;Bilateral upper extremity supported Sitting balance-Leahy Scale: Fair     Standing balance support: Bilateral upper extremity supported;During functional activity Standing balance-Leahy Scale: Poor Standing balance comment: Reliant on UE support for balance                           ADL either performed or assessed with clinical judgement   ADL Overall ADL's : Needs assistance/impaired Eating/Feeding: Set up   Grooming: Wash/dry face;Wash/dry hands;Oral care;Set up;Sitting   Upper Body Bathing: Minimal assistance   Lower Body Bathing: Maximal assistance         Lower Body Dressing Details (indicate cue type and reason): pt is unable to cross bil LE and will need (A) from wife or AE. Pts wife present and agreeable to (A) with LB if needed Toilet Transfer: Moderate assistance Toilet Transfer Details (indicate cue type and reason): pt required (A) to push up to complete transfer         Functional mobility during ADLs: Minimal assistance;Rolling walker General ADL Comments: pt requires extensive (A) for bed level mobility and sit<>stand. Pt and wife educated on elevation of surfaces. pt plans to stay on main floor and only use couch for sleeping to avoid stairs   Back handout provided and reviewed adls in detail. Pt educated on: clothing between brace, never sleep in brace, set an alarm at night for medication, avoid sitting for long periods of time, correct bed positioning for sleeping,  correct sequence for bed mobility, avoiding lifting more than 5 pounds and never wash directly over incision. Pt educated to use new linen for each shower   Vision Baseline Vision/History: No visual deficits       Perception     Praxis      Pertinent Vitals/Pain Pain Assessment:  Faces Faces Pain Scale: Hurts even more Pain Location: Back and abdomen Pain Descriptors / Indicators: Operative site guarding;Discomfort Pain Intervention(s): Monitored during session;Premedicated before session;Repositioned     Hand Dominance Right   Extremity/Trunk Assessment Upper Extremity Assessment Upper Extremity Assessment: Generalized weakness   Lower Extremity Assessment Lower Extremity Assessment: Defer to PT evaluation   Cervical / Trunk Assessment Cervical / Trunk Assessment: Other exceptions Cervical / Trunk Exceptions: Forward head posture; s/p surgery   Communication Communication Communication: No difficulties   Cognition Arousal/Alertness: Awake/alert Behavior During Therapy: WFL for tasks assessed/performed Overall Cognitive Status: Within Functional Limits for tasks assessed                                     General Comments  pt currently with R abdominal cather and recommend leg based foly bag to allow for easy don doff of pants. RN made aware of foley bag currently in place with pending follow up appointment. pt also with tubing taped to leg in an "X" pattern and requesting for patient to have leg strap instead of tape.     Exercises     Shoulder Instructions      Home Living Family/patient expects to be discharged to:: Private residence Living Arrangements: Spouse/significant other Available Help at Discharge: Family;Available 24 hours/day Type of Home: House Home Access: Stairs to enter [employer] of Steps: 5 Entrance Stairs-Rails: Right;Left Home Layout: Two level;1/2 bath on main level;Able to live on main level with bedroom/bathroom     Bathroom Shower/Tub: Teacher, early years/pre: Standard     Home Equipment: None          Prior Functioning/Environment Level of Independence: Independent                 OT Problem List: Decreased strength;Decreased activity tolerance;Impaired balance  (sitting and/or standing);Decreased safety awareness;Decreased knowledge of use of DME or AE;Decreased knowledge of precautions;Pain      OT Treatment/Interventions: Self-care/ADL training;Therapeutic exercise;DME and/or AE instruction;Therapeutic activities;Balance training;Patient/family education    OT Goals(Current goals can be found in the care plan section) Acute Rehab OT Goals Patient Stated Goal: Return home at d/c OT Goal Formulation: With patient Time For Goal Achievement: 08/08/17 Potential to Achieve Goals: Good  OT Frequency: Min 2X/week   Barriers to D/C:            Co-evaluation              AM-PAC PT "6 Clicks" Daily Activity     Outcome Measure Help from another person eating meals?: None Help from another person taking care of personal grooming?: A Little Help from another person toileting, which includes using toliet, bedpan, or urinal?: A Lot Help from another person bathing (including washing, rinsing, drying)?: A Lot Help from another person to put on and taking off regular upper body clothing?: A Lot Help from another person to put on and taking off regular lower body clothing?: A Lot 6 Click Score: 15   End of Session Equipment Utilized During Treatment: Gait belt;Rolling walker;Back brace Nurse Communication: Mobility status;Precautions  Activity Tolerance: Patient tolerated treatment well Patient left: Other (comment)(with PT Mickel Baas)  OT Visit Diagnosis: Unsteadiness on feet (R26.81);Pain                Time: 0825-0906 OT Time Calculation (min): 41 min Charges:  OT General Charges $OT Visit: 1 Visit OT Evaluation $OT Eval Moderate Complexity: 1 Mod OT Treatments $Self Care/Home Management : 23-37 mins G-Codes:      Jeri Modena   OTR/L Pager: [phone] Office: [phone] .   Parke Poisson B 07/25/2017, 3:46 PM

## 2017-07-25 NOTE — Progress Notes (Signed)
Physical Therapy Treatment Patient Details Name: Anthony Campos MRN: [id number] DOB: 1956/06/10 Today's Date: 07/25/2017    History of Present Illness Pt is a 62 y/o male who presents s/p L2-S1 PLIF on 07/24/17. He underwent L4-S1 ALIF, L3-L4 lateral approach on 07/23/17.    PT Comments    Pt progressing towards physical therapy goals. Was able to perform transfers and ambulation with gross min guard assist to min assist for balance support and safety. Continues to be reliant on RW at this time due to pain. Will continue to follow.    Follow Up Recommendations  No PT follow up;Supervision for mobility/OOB(MD to order PT when appropriate per post-op protocol)     Equipment Recommendations  Rolling walker with 5" wheels;3in1 (PT)    Recommendations for Other Services       Precautions / Restrictions Precautions Precautions: Fall;Back Precaution Booklet Issued: Yes (comment) Precaution Comments: Reviewed verbally and provided handout at the end of the session Required Braces or Orthoses: Spinal Brace Spinal Brace: Thoracolumbosacral orthotic;Applied in sitting position Restrictions Weight Bearing Restrictions: No    Mobility  Bed Mobility               General bed mobility comments: Pt was received standing in room with OT.  Transfers Overall transfer level: Needs assistance Equipment used: Rolling walker (2 wheeled) Transfers: Sit to/from Stand Sit to Stand: Min assist         General transfer comment: Assist for controlled descent to chair. Pt was cued for hand placement on seated surface for safety, and a wide BOS to aide in controlled descent to chair. Pt's chair was boosted up with pillow for comfort and ease of sit>stand later.   Ambulation/Gait Ambulation/Gait assistance: Min guard Ambulation Distance (Feet): 80 Feet Assistive device: Rolling walker (2 wheeled) Gait Pattern/deviations: Step-through pattern;Decreased stride length;Trunk flexed;Narrow base of  support Gait velocity: Decreased Gait velocity interpretation: Below normal speed for age/gender General Gait Details: With RW, pt required close guard for safety. Hands-on guarding provided throughout gait training as pt appeared unsteady and guarded with movement.    Stairs            Wheelchair Mobility    Modified Rankin (Stroke Patients Only)       Balance Overall balance assessment: Needs assistance Sitting-balance support: Feet supported;No upper extremity supported Sitting balance-Leahy Scale: Fair     Standing balance support: Bilateral upper extremity supported;During functional activity Standing balance-Leahy Scale: Poor Standing balance comment: Reliant on UE support for balance                            Cognition Arousal/Alertness: Awake/alert Behavior During Therapy: WFL for tasks assessed/performed Overall Cognitive Status: Within Functional Limits for tasks assessed                                        Exercises      General Comments        Pertinent Vitals/Pain Pain Assessment: Faces Faces Pain Scale: Hurts even more Pain Location: Back and abdomen Pain Descriptors / Indicators: Operative site guarding;Discomfort Pain Intervention(s): Limited activity within patient's tolerance;Monitored during session;Repositioned    Home Living                      Prior Function  PT Goals (current goals can now be found in the care plan section) Acute Rehab PT Goals Patient Stated Goal: Return home at d/c PT Goal Formulation: With patient/family Time For Goal Achievement: 08/07/17 Potential to Achieve Goals: Good Progress towards PT goals: Progressing toward goals    Frequency    Min 5X/week      PT Plan Current plan remains appropriate    Co-evaluation              AM-PAC PT "6 Clicks" Daily Activity  Outcome Measure  Difficulty turning over in bed (including adjusting  bedclothes, sheets and blankets)?: Unable Difficulty moving from lying on back to sitting on the side of the bed? : Unable Difficulty sitting down on and standing up from a chair with arms (e.g., wheelchair, bedside commode, etc,.)?: Unable Help needed moving to and from a bed to chair (including a wheelchair)?: A Little Help needed walking in hospital room?: A Little Help needed climbing 3-5 steps with a railing? : A Little 6 Click Score: 12    End of Session Equipment Utilized During Treatment: Gait belt;Back brace Activity Tolerance: Patient tolerated treatment well Patient left: in chair;with call bell/phone within reach;with family/visitor present Nurse Communication: Mobility status PT Visit Diagnosis: Unsteadiness on feet (R26.81);Pain;Other symptoms and signs involving the nervous system (R29.898) Pain - part of body: (back/abdomen)     Time: 9833-8250 PT Time Calculation (min) (ACUTE ONLY): 18 min  Charges:  $Gait Training: 8-22 mins                    G Codes:       Rolinda Roan, PT, DPT Acute Rehabilitation Services Pager: [place] 07/25/2017, 11:58 AM

## 2017-07-25 NOTE — Progress Notes (Signed)
1 Day Post-Op Subjective: Patient reports moderate pain but controlled with medications  Objective: Vital signs in last 24 hours: Temp:  [97.7 F (36.5 C)-100.7 F (38.2 C)] 99.6 F (37.6 C) (01/18 0400) Pulse Rate:  [83-125] 125 (01/18 0400) Resp:  [10-25] 18 (01/18 0400) BP: (105-147)/(67-94) 113/67 (01/18 0400) SpO2:  [94 %-100 %] 97 % (01/18 0400) Weight:  [109.8 kg (242 lb)] 109.8 kg (242 lb) (01/17 1310)  Intake/Output from previous day: 01/17 0701 - 01/18 0700 In: 2353 [I.V.:1803; IV Piggyback:100] Out: 2375 [Urine:2325; Blood:50] Intake/Output this shift: No intake/output data recorded.  Physical Exam:  Constitutional: Vital signs reviewed. WD WN in NAD   Eyes: PERRL, No scleral icterus.   Pulmonary/Chest: Normal effort Extremities: No cyanosis or edema   Lab Results: Recent Labs    07/24/17 0659  HGB 11.6*  HCT 34.0*   BMET Recent Labs    07/24/17 0659  NA 140  K 4.0  GLUCOSE 152*   No results for input(s): LABPT, INR in the last 72 hours. No results for input(s): LABURIN in the last 72 hours. Results for orders placed or performed during the hospital encounter of 07/21/17  Surgical pcr screen     Status: None   Collection Time: 07/21/17  9:54 AM  Result Value Ref Range Status   MRSA, PCR NEGATIVE NEGATIVE Final   Staphylococcus aureus NEGATIVE NEGATIVE Final    Comment: (NOTE) The Xpert SA Assay (FDA approved for NASAL specimens in patients 68 years of age and older), is one component of a comprehensive surveillance program. It is not intended to diagnose infection nor to guide or monitor treatment.     Studies/Results: Dg Lumbar Spine 2-3 Views  Result Date: 07/24/2017 CLINICAL DATA:  Posterior lumbar fusion from L3-S1. Reported fluoro time is 6 minutes, 12 seconds EXAM: DG C-ARM 61-120 MIN; LUMBAR SPINE - 2-3 VIEW COMPARISON:  AP and lateral radiographic views of the lumbar spine of July 23, 2017 FINDINGS: The patient is undergone pedicle  screw placement at L3, L4, L5, and S1. There are connecting rods present bilaterally. There pre-existing intradiscal devices at L3-4, L4-5, and L5-S1. There is no immediate complication. IMPRESSION: Posterior fusion appliances have been placed from L3 through S1. Electronically Signed   By: David  Martinique M.D.   On: 07/24/2017 10:59   Dg Lumbar Spine 2-3 Views  Result Date: 07/23/2017 CLINICAL DATA:  L4-5 and L5-S1 ALIF EXAM: DG C-ARM 61-120 MIN; LUMBAR SPINE - 2-3 VIEW COMPARISON:  MRI 04/30/2017 FINDINGS: Two low resolution intraoperative spot views of the lumbar spine. Total fluoroscopy time was 3 minutes 3 seconds. Images demonstrate anterior screw fixation at L5 and S1 with interbody device at L3-L4, L4-L5 and L5-S1. IMPRESSION: Intraoperative fluoroscopic assistance provided during lumbar spine surgery. Electronically Signed   By: Donavan Foil M.D.   On: 07/23/2017 16:47   Dg C-arm 1-60 Min  Result Date: 07/24/2017 CLINICAL DATA:  Posterior lumbar fusion from L3-S1. Reported fluoro time is 6 minutes, 12 seconds EXAM: DG C-ARM 61-120 MIN; LUMBAR SPINE - 2-3 VIEW COMPARISON:  AP and lateral radiographic views of the lumbar spine of July 23, 2017 FINDINGS: The patient is undergone pedicle screw placement at L3, L4, L5, and S1. There are connecting rods present bilaterally. There pre-existing intradiscal devices at L3-4, L4-5, and L5-S1. There is no immediate complication. IMPRESSION: Posterior fusion appliances have been placed from L3 through S1. Electronically Signed   By: David  Martinique M.D.   On: 07/24/2017 10:59   Dg  C-arm 1-60 Min  Result Date: 07/24/2017 CLINICAL DATA:  Posterior lumbar fusion from L3-S1. Reported fluoro time is 6 minutes, 12 seconds EXAM: DG C-ARM 61-120 MIN; LUMBAR SPINE - 2-3 VIEW COMPARISON:  AP and lateral radiographic views of the lumbar spine of July 23, 2017 FINDINGS: The patient is undergone pedicle screw placement at L3, L4, L5, and S1. There are connecting rods  present bilaterally. There pre-existing intradiscal devices at L3-4, L4-5, and L5-S1. There is no immediate complication. IMPRESSION: Posterior fusion appliances have been placed from L3 through S1. Electronically Signed   By: David  Martinique M.D.   On: 07/24/2017 10:59   Dg C-arm 1-60 Min  Result Date: 07/24/2017 CLINICAL DATA:  Posterior lumbar fusion from L3-S1. Reported fluoro time is 6 minutes, 12 seconds EXAM: DG C-ARM 61-120 MIN; LUMBAR SPINE - 2-3 VIEW COMPARISON:  AP and lateral radiographic views of the lumbar spine of July 23, 2017 FINDINGS: The patient is undergone pedicle screw placement at L3, L4, L5, and S1. There are connecting rods present bilaterally. There pre-existing intradiscal devices at L3-4, L4-5, and L5-S1. There is no immediate complication. IMPRESSION: Posterior fusion appliances have been placed from L3 through S1. Electronically Signed   By: David  Martinique M.D.   On: 07/24/2017 10:59   Dg C-arm 1-60 Min  Result Date: 07/23/2017 CLINICAL DATA:  L4-5 and L5-S1 ALIF EXAM: DG C-ARM 61-120 MIN; LUMBAR SPINE - 2-3 VIEW COMPARISON:  MRI 04/30/2017 FINDINGS: Two low resolution intraoperative spot views of the lumbar spine. Total fluoroscopy time was 3 minutes 3 seconds. Images demonstrate anterior screw fixation at L5 and S1 with interbody device at L3-L4, L4-L5 and L5-S1. IMPRESSION: Intraoperative fluoroscopic assistance provided during lumbar spine surgery. Electronically Signed   By: Donavan Foil M.D.   On: 07/23/2017 16:47   Dg C-arm 1-60 Min  Result Date: 07/23/2017 CLINICAL DATA:  L4-5 and L5-S1 ALIF EXAM: DG C-ARM 61-120 MIN; LUMBAR SPINE - 2-3 VIEW COMPARISON:  MRI 04/30/2017 FINDINGS: Two low resolution intraoperative spot views of the lumbar spine. Total fluoroscopy time was 3 minutes 3 seconds. Images demonstrate anterior screw fixation at L5 and S1 with interbody device at L3-L4, L4-L5 and L5-S1. IMPRESSION: Intraoperative fluoroscopic assistance provided during lumbar  spine surgery. Electronically Signed   By: Donavan Foil M.D.   On: 07/23/2017 16:47   Dg C-arm 1-60 Min  Result Date: 07/23/2017 CLINICAL DATA:  L4-5 and L5-S1 ALIF EXAM: DG C-ARM 61-120 MIN; LUMBAR SPINE - 2-3 VIEW COMPARISON:  MRI 04/30/2017 FINDINGS: Two low resolution intraoperative spot views of the lumbar spine. Total fluoroscopy time was 3 minutes 3 seconds. Images demonstrate anterior screw fixation at L5 and S1 with interbody device at L3-L4, L4-L5 and L5-S1. IMPRESSION: Intraoperative fluoroscopic assistance provided during lumbar spine surgery. Electronically Signed   By: Donavan Foil M.D.   On: 07/23/2017 16:47   Dg Or Local Abdomen  Result Date: 07/23/2017 CLINICAL DATA:  Lumbar discectomy and fusion. EXAM: OR LOCAL ABDOMEN COMPARISON:  MRI 04/30/2017 FINDINGS: AP view shows interbody fusion material at L4-5 and L5-S1. Anterior screws and washers in place at L5-S1. Catheter present overlying the pelvis. IMPRESSION: Discectomy at L4-5 and L5-S1. Interbody fusion material. Anterior screws and washers L5 and S1. Electronically Signed   By: Nelson Chimes M.D.   On: 07/23/2017 14:01    Assessment/Plan:   Urethral trauma from catheter passage, most likely related to pre-existing urethral stricture.  In speaking with the patient and his wife, for quite a while he  has had a slow stream, feeling of inadequate emptying, intermittency, hesitancy.  These are indicative of obstruction.    I would recommend that the patient go home with the suprapubic tube.  I will have the nurses instruct him how to empty the bag.  We will arrange for the patient to be seen in the office.  He will eventually need cystogram with voiding phase.  He may well be able to have the suprapubic tube removed  after a voiding trial as an outpatient.    From urologic standpoint, he is fine to go home.  We will make sure he has proper follow-up.   LOS: 2 days   Jorja Loa 07/25/2017, 8:04 AM

## 2017-07-26 MED ORDER — PANTOPRAZOLE SODIUM 40 MG PO TBEC: 40.0000 mg | DELAYED_RELEASE_TABLET | Freq: Every day | ORAL | Status: DC

## 2017-07-26 NOTE — Progress Notes (Signed)
Occupational Therapy Treatment and Discharge Patient Details Name: Anthony Campos MRN: [id number] DOB: 27-Nov-1955 Today's Date: 07/26/2017    History of present illness Pt is a 62 y/o male who presents s/p L2-S1 PLIF on 07/24/17. He underwent L4-S1 ALIF, L3-L4 lateral approach on 07/23/17.   OT comments  This 62 yo male admitted and underwent above presents to acute OT with all education completed with pt and wife, no further OT needs, we will sign off.  Follow Up Recommendations  Home health OT    Equipment Recommendations  3 in 1 bedside commode;Other (comment)(RW)       Precautions / Restrictions Precautions Precautions: Fall;Back Precaution Booklet Issued: Yes (comment) Precaution Comments: pt needed cues from wife as to what his back precautions are; pt and wife report they have a post op back theapy handout Required Braces or Orthoses: Spinal Brace Spinal Brace: Thoracolumbosacral orthotic;Applied in sitting position Restrictions Weight Bearing Restrictions: No       Mobility Bed Mobility       General bed mobility comments: Pt sitting on EOB upon my arrival  Transfers Overall transfer level: Needs assistance Equipment used: Rolling walker (2 wheeled) Transfers: Sit to/from Stand Sit to Stand: Min guard         General transfer comment: For safety as pt powered up to full standing position at EOB. Increased time required.    Balance Overall balance assessment: Needs assistance Sitting-balance support: No upper extremity supported;Feet supported Sitting balance-Leahy Scale: Good     Standing balance support: Bilateral upper extremity supported;During functional activity Standing balance-Leahy Scale: Poor Standing balance comment: Reliant on UE support for balance                           ADL either performed or assessed with clinical judgement   ADL                                         General ADL Comments: Educated pt and  family on tub transfer (side step) and using 3n1 in tub as seat, 2 cups for brushing teeth so as to not bend over the sink (one rinse cup and one spit cup); Using wet wipes post bowel movement to help with getting cleaner easier and quicker so as to avoid twisting much. Pt min A to don brace at EOB.      Vision Patient Visual Report: No change from baseline            Cognition Arousal/Alertness: Awake/alert Behavior During Therapy: WFL for tasks assessed/performed Overall Cognitive Status: Within Functional Limits for tasks assessed                                 General Comments: decreased recall of precautions x 2 times asked, wife fully aware of them                   Pertinent Vitals/ Pain       Pain Assessment: 0-10 Pain Score: 4  Faces Pain Scale: Hurts a little bit Pain Location:  abdomen Pain Descriptors / Indicators: Sore Pain Intervention(s): Limited activity within patient's tolerance;Monitored during session;Repositioned            Progress Toward Goals  OT Goals(current goals can now be found in the care plan  section)  Progress towards OT goals: (All education completed)  Acute Rehab OT Goals Patient Stated Goal: Return home at d/c  Plan Discharge plan remains appropriate       AM-PAC PT "6 Clicks" Daily Activity     Outcome Measure   Help from another person eating meals?: None Help from another person taking care of personal grooming?: A Little Help from another person toileting, which includes using toliet, bedpan, or urinal?: A Lot Help from another person bathing (including washing, rinsing, drying)?: A Lot Help from another person to put on and taking off regular upper body clothing?: A Little Help from another person to put on and taking off regular lower body clothing?: Total 6 Click Score: 15    End of Session Equipment Utilized During Treatment: Gait belt;Rolling walker;Back brace  OT Visit Diagnosis: Unsteadiness on  feet (R26.81);Pain Pain - part of body: (abdomen)   Activity Tolerance Patient tolerated treatment well   Patient Left (standing up at foot of bed with RW)   Nurse Communication          Time: 4098-1191 OT Time Calculation (min): 30 min  Charges: OT General Charges $OT Visit: 1 Visit OT Treatments $Self Care/Home Management : 23-37 mins [place], OTR/L [phone] 07/26/2017

## 2017-07-26 NOTE — Progress Notes (Signed)
    Patient doing well PO day 2/3. Well controlled minimal LBP, denies leg pain, improving strength and function. He is pleased with his progress in PT/OT and amenable to D/C home today. He has been up and ambulating this morning. Eating and drinking NL, NL BM, suprapubic catheter in place without complaint.    Physical Exam: Vitals:   07/26/17 0345 07/26/17 0817  BP: 129/84 (!) 133/95  Pulse: (!) 113 (!) 120  Resp: 18 18  Temp: 98.2 F (36.8 C) 97.9 F (36.6 C)  SpO2: 94% 97%    Dressing's in place, CDI, suprapubic catheter in place w/o complication. Pt sitting at edge of bed, brace at bedside  NVI  POD #2/3 s/p ANT/LAT/Post lumbar fusion   - up with PT/OT, encourage ambulation  -TLSO brace at all times when upright or oob - Percocet for pain, Valium for muscle spasms  -Scripts in chart for D/C - likely d/c home today with f/u in 2 weeks

## 2017-07-26 NOTE — Progress Notes (Signed)
Physical Therapy Treatment Patient Details Name: Anthony Campos MRN: [id number] DOB: 1955/09/11 Today's Date: 07/26/2017    History of Present Illness Pt is a 62 y/o male who presents s/p L2-S1 PLIF on 07/24/17. He underwent L4-S1 ALIF, L3-L4 lateral approach on 07/23/17.    PT Comments    Pt progressing towards physical therapy goals. Was able to perform transfers and ambulation with gross min guard assist to supervision for safety. Pt and wife were educated on precautions, stair negotiation, car transfer, and safety with mobility progression. Will continue to follow and progress as able per POC.    Follow Up Recommendations  No PT follow up;Supervision for mobility/OOB(MD to order PT when appropriate per post-op protocol)     Equipment Recommendations  Rolling walker with 5" wheels;3in1 (PT)    Recommendations for Other Services       Precautions / Restrictions Precautions Precautions: Fall;Back Precaution Booklet Issued: Yes (comment) Precaution Comments: pt needed cues from wife as to what his back precautions are; pt and wife report they have a post op back theapy handout Required Braces or Orthoses: Spinal Brace Spinal Brace: Thoracolumbosacral orthotic;Applied in sitting position Restrictions Weight Bearing Restrictions: No    Mobility  Bed Mobility Overal bed mobility: Needs Assistance Bed Mobility: Rolling;Supine to Sit Rolling: Supervision Sidelying to sit: Min guard       General bed mobility comments: Pt was able to transition to EOB without assistance this session. Increased time and min cues for technique only.   Transfers Overall transfer level: Needs assistance Equipment used: Rolling walker (2 wheeled) Transfers: Sit to/from Stand Sit to Stand: Supervision         General transfer comment: Close supervision for safety as pt powered up to full standing position at EOB. Increased time required.  Ambulation/Gait Ambulation/Gait assistance: Min  guard;Supervision Ambulation Distance (Feet): 300 Feet Assistive device: Rolling walker (2 wheeled) Gait Pattern/deviations: Step-through pattern;Decreased stride length;Trunk flexed Gait velocity: Decreased Gait velocity interpretation: Below normal speed for age/gender General Gait Details: VC's for improved posture. Pt initially requiring hands-on guarding however progressed to supervision for safety.    Stairs Stairs: Yes   Stair Management: One rail Left;Step to pattern;Sideways Number of Stairs: 5 General stair comments: Pt demonstrated well with close guard for safety. Minimal cues provided for technique with sidestep.  Wheelchair Mobility    Modified Rankin (Stroke Patients Only)       Balance Overall balance assessment: Needs assistance Sitting-balance support: Feet supported;Bilateral upper extremity supported Sitting balance-Leahy Scale: Fair     Standing balance support: Bilateral upper extremity supported;During functional activity Standing balance-Leahy Scale: Poor Standing balance comment: Reliant on UE support for balance                            Cognition Arousal/Alertness: Awake/alert Behavior During Therapy: WFL for tasks assessed/performed Overall Cognitive Status: Within Functional Limits for tasks assessed                                        Exercises      General Comments        Pertinent Vitals/Pain Pain Assessment: 0-10 Pain Score: 4  Faces Pain Scale: Hurts a little bit Pain Location:  abdomen Pain Descriptors / Indicators: Sore Pain Intervention(s): Limited activity within patient's tolerance;Monitored during session;Repositioned    Home Living  Prior Function            PT Goals (current goals can now be found in the care plan section) Acute Rehab PT Goals Patient Stated Goal: Return home at d/c PT Goal Formulation: With patient/family Time For Goal Achievement:  08/07/17 Potential to Achieve Goals: Good Progress towards PT goals: Progressing toward goals    Frequency    Min 5X/week      PT Plan Current plan remains appropriate    Co-evaluation              AM-PAC PT "6 Clicks" Daily Activity  Outcome Measure  Difficulty turning over in bed (including adjusting bedclothes, sheets and blankets)?: A Little Difficulty moving from lying on back to sitting on the side of the bed? : A Little Difficulty sitting down on and standing up from a chair with arms (e.g., wheelchair, bedside commode, etc,.)?: A Little Help needed moving to and from a bed to chair (including a wheelchair)?: A Little Help needed walking in hospital room?: A Little Help needed climbing 3-5 steps with a railing? : A Little 6 Click Score: 18    End of Session Equipment Utilized During Treatment: Gait belt;Back brace Activity Tolerance: Patient tolerated treatment well Patient left: with call bell/phone within reach;with family/visitor present(Sitting EOB to get dressed) Nurse Communication: Mobility status PT Visit Diagnosis: Unsteadiness on feet (R26.81);Pain;Other symptoms and signs involving the nervous system (R29.898) Pain - part of body: (back/abdomen)     Time: 7616-0737 PT Time Calculation (min) (ACUTE ONLY): 24 min  Charges:  $Gait Training: 23-37 mins                    G Codes:       Rolinda Roan, PT, DPT Acute Rehabilitation Services Pager: [place] 07/26/2017, 10:48 AM

## 2017-07-26 NOTE — Progress Notes (Signed)
Patient is discharged from room 3C03 at this time. Alert and in stable condition. IV site d/c'd and instructions read to patient and family with understanding verbalized. Left unit via wheelchair with all belongings at side. 

## 2017-07-28 ENCOUNTER — Encounter (HOSPITAL_COMMUNITY): Payer: Self-pay | Admitting: Orthopedic Surgery

## 2017-08-06 NOTE — Discharge Summary (Signed)
Patient ID: Anthony Campos MRN: [id number] DOB/AGE: [birth year] 62 y.o.  Admit date: 07/23/2017 Discharge date: 07/26/2017  Admission Diagnoses:  Active Problems:   Radiculopathy   Discharge Diagnoses:  Same  Past Medical History:  Diagnosis Date  . Erectile dysfunction   . Hyperlipidemia   . Hypertension   . Infection of skin and subcutaneous tissue   . Lipoma   . Tobacco abuse    hx    Surgeries: Procedure(s): POSTERIOR LUMBAR FUSION LUMBAR THREE TO SACRAL ONE on 07/24/2017   Consultants: Treatment Team:  Franchot Gallo, MD  Discharged Condition: Improved  Hospital Course: Anthony Campos is an 62 y.o. male who was admitted 07/23/2017 for operative treatment of radiculopathy. Patient has severe unremitting pain that affects sleep, daily activities, and work/hobbies. After pre-op clearance the patient was taken to the operating room on 07/24/2017 and underwent  Procedure(s): POSTERIOR LUMBAR FUSION LUMBAR THREE TO SACRAL ONE.    Patient was given perioperative antibiotics:  Anti-infectives (From admission, onward)   Start     Dose/Rate Route Frequency Ordered Stop   07/24/17 1315  ceFAZolin (ANCEF) IVPB 2g/100 mL premix     2 g 200 mL/hr over 30 Minutes Intravenous Every 8 hours 07/24/17 1309 07/24/17 2155   07/23/17 2200  ceFAZolin (ANCEF) IVPB 2g/100 mL premix     2 g 200 mL/hr over 30 Minutes Intravenous Every 8 hours 07/23/17 1856 07/24/17 1406   07/23/17 1045  gentamicin (GARAMYCIN) 550 mg in dextrose 5 % 50 mL IVPB     5 mg/kg  109.9 kg 127.5 mL/hr over 30 Minutes Intravenous To Surgery 07/23/17 1039 07/24/17 1406   07/23/17 0635  ceFAZolin (ANCEF) IVPB 2g/100 mL premix     2 g 200 mL/hr over 30 Minutes Intravenous On call to O.R. 07/23/17 9211 07/23/17 1415       Patient was given sequential compression devices, early ambulation to prevent DVT.  Patient benefited maximally from hospital stay and there were no complications.    Recent vital signs: BP (!)  133/95   Pulse (!) 120   Temp 97.9 F (36.6 C)   Resp 18   Ht 6\' 2"  (1.88 m)   Wt 109.8 kg (242 lb)   SpO2 97%   BMI 31.07 kg/m    Discharge Medications:   Allergies as of 07/26/2017   No Known Allergies     Medication List    TAKE these medications   atorvastatin 40 MG tablet Commonly known as:  LIPITOR TAKE 1 TABLET BY MOUTH EVERY DAY What changed:    how much to take  how to take this  when to take this  additional instructions   gabapentin 300 MG capsule Commonly known as:  NEURONTIN Take 300 mg by mouth 3 (three) times daily.   hydrochlorothiazide 12.5 MG tablet Commonly known as:  HYDRODIURIL TAKE 1 TABLET BY MOUTH EVERY DAY   sildenafil 20 MG tablet Commonly known as:  REVATIO Take 1-2 tablets once daily as needed.       Diagnostic Studies: Dg Chest 2 View  Result Date: 07/23/2017 CLINICAL DATA:  Preoperative lumbar surgery. History of tobacco use. EXAM: CHEST  2 VIEW COMPARISON:  None. FINDINGS: There is no edema or consolidation. The heart size and pulmonary vascularity are normal. No adenopathy. No evident bone lesions. IMPRESSION: No edema or consolidation. Electronically Signed   By: Lowella Grip III M.D.   On: 07/23/2017 07:42   Dg Lumbar Spine 2-3 Views  Result Date: 07/24/2017  CLINICAL DATA:  Posterior lumbar fusion from L3-S1. Reported fluoro time is 6 minutes, 12 seconds EXAM: DG C-ARM 61-120 MIN; LUMBAR SPINE - 2-3 VIEW COMPARISON:  AP and lateral radiographic views of the lumbar spine of July 23, 2017 FINDINGS: The patient is undergone pedicle screw placement at L3, L4, L5, and S1. There are connecting rods present bilaterally. There pre-existing intradiscal devices at L3-4, L4-5, and L5-S1. There is no immediate complication. IMPRESSION: Posterior fusion appliances have been placed from L3 through S1. Electronically Signed   By: David  Martinique M.D.   On: 07/24/2017 10:59   Dg Lumbar Spine 2-3 Views  Result Date: 07/23/2017 CLINICAL  DATA:  L4-5 and L5-S1 ALIF EXAM: DG C-ARM 61-120 MIN; LUMBAR SPINE - 2-3 VIEW COMPARISON:  MRI 04/30/2017 FINDINGS: Two low resolution intraoperative spot views of the lumbar spine. Total fluoroscopy time was 3 minutes 3 seconds. Images demonstrate anterior screw fixation at L5 and S1 with interbody device at L3-L4, L4-L5 and L5-S1. IMPRESSION: Intraoperative fluoroscopic assistance provided during lumbar spine surgery. Electronically Signed   By: Donavan Foil M.D.   On: 07/23/2017 16:47   Dg C-arm 1-60 Min  Result Date: 07/24/2017 CLINICAL DATA:  Posterior lumbar fusion from L3-S1. Reported fluoro time is 6 minutes, 12 seconds EXAM: DG C-ARM 61-120 MIN; LUMBAR SPINE - 2-3 VIEW COMPARISON:  AP and lateral radiographic views of the lumbar spine of July 23, 2017 FINDINGS: The patient is undergone pedicle screw placement at L3, L4, L5, and S1. There are connecting rods present bilaterally. There pre-existing intradiscal devices at L3-4, L4-5, and L5-S1. There is no immediate complication. IMPRESSION: Posterior fusion appliances have been placed from L3 through S1. Electronically Signed   By: David  Martinique M.D.   On: 07/24/2017 10:59   Dg C-arm 1-60 Min  Result Date: 07/24/2017 CLINICAL DATA:  Posterior lumbar fusion from L3-S1. Reported fluoro time is 6 minutes, 12 seconds EXAM: DG C-ARM 61-120 MIN; LUMBAR SPINE - 2-3 VIEW COMPARISON:  AP and lateral radiographic views of the lumbar spine of July 23, 2017 FINDINGS: The patient is undergone pedicle screw placement at L3, L4, L5, and S1. There are connecting rods present bilaterally. There pre-existing intradiscal devices at L3-4, L4-5, and L5-S1. There is no immediate complication. IMPRESSION: Posterior fusion appliances have been placed from L3 through S1. Electronically Signed   By: David  Martinique M.D.   On: 07/24/2017 10:59   Dg C-arm 1-60 Min  Result Date: 07/24/2017 CLINICAL DATA:  Posterior lumbar fusion from L3-S1. Reported fluoro time is 6  minutes, 12 seconds EXAM: DG C-ARM 61-120 MIN; LUMBAR SPINE - 2-3 VIEW COMPARISON:  AP and lateral radiographic views of the lumbar spine of July 23, 2017 FINDINGS: The patient is undergone pedicle screw placement at L3, L4, L5, and S1. There are connecting rods present bilaterally. There pre-existing intradiscal devices at L3-4, L4-5, and L5-S1. There is no immediate complication. IMPRESSION: Posterior fusion appliances have been placed from L3 through S1. Electronically Signed   By: David  Martinique M.D.   On: 07/24/2017 10:59   Dg C-arm 1-60 Min  Result Date: 07/23/2017 CLINICAL DATA:  L4-5 and L5-S1 ALIF EXAM: DG C-ARM 61-120 MIN; LUMBAR SPINE - 2-3 VIEW COMPARISON:  MRI 04/30/2017 FINDINGS: Two low resolution intraoperative spot views of the lumbar spine. Total fluoroscopy time was 3 minutes 3 seconds. Images demonstrate anterior screw fixation at L5 and S1 with interbody device at L3-L4, L4-L5 and L5-S1. IMPRESSION: Intraoperative fluoroscopic assistance provided during lumbar spine surgery. Electronically  Signed   By: Donavan Foil M.D.   On: 07/23/2017 16:47   Dg C-arm 1-60 Min  Result Date: 07/23/2017 CLINICAL DATA:  L4-5 and L5-S1 ALIF EXAM: DG C-ARM 61-120 MIN; LUMBAR SPINE - 2-3 VIEW COMPARISON:  MRI 04/30/2017 FINDINGS: Two low resolution intraoperative spot views of the lumbar spine. Total fluoroscopy time was 3 minutes 3 seconds. Images demonstrate anterior screw fixation at L5 and S1 with interbody device at L3-L4, L4-L5 and L5-S1. IMPRESSION: Intraoperative fluoroscopic assistance provided during lumbar spine surgery. Electronically Signed   By: Donavan Foil M.D.   On: 07/23/2017 16:47   Dg C-arm 1-60 Min  Result Date: 07/23/2017 CLINICAL DATA:  L4-5 and L5-S1 ALIF EXAM: DG C-ARM 61-120 MIN; LUMBAR SPINE - 2-3 VIEW COMPARISON:  MRI 04/30/2017 FINDINGS: Two low resolution intraoperative spot views of the lumbar spine. Total fluoroscopy time was 3 minutes 3 seconds. Images demonstrate  anterior screw fixation at L5 and S1 with interbody device at L3-L4, L4-L5 and L5-S1. IMPRESSION: Intraoperative fluoroscopic assistance provided during lumbar spine surgery. Electronically Signed   By: Donavan Foil M.D.   On: 07/23/2017 16:47   Dg Or Local Abdomen  Result Date: 07/23/2017 CLINICAL DATA:  Lumbar discectomy and fusion. EXAM: OR LOCAL ABDOMEN COMPARISON:  MRI 04/30/2017 FINDINGS: AP view shows interbody fusion material at L4-5 and L5-S1. Anterior screws and washers in place at L5-S1. Catheter present overlying the pelvis. IMPRESSION: Discectomy at L4-5 and L5-S1. Interbody fusion material. Anterior screws and washers L5 and S1. Electronically Signed   By: Nelson Chimes M.D.   On: 07/23/2017 14:01    Disposition: 01-Home or Self Care  Discharge Instructions    Discharge patient   Complete by:  As directed    Discharge disposition:  01-Home or Self Care   Discharge patient date:  07/25/2017   Discharge patient   Complete by:  As directed    Discharge disposition:  01-Home or Self Care   Discharge patient date:  07/26/2017     POD #2/3 s/p ANT/LAT/Post lumbar fusion   - up with PT/OT, encourage ambulation             -TLSO brace at all times when upright or oob - Percocet for pain, Valium for muscle spasms             -Scripts in chart for D/C - d/c home today with f/u in 2 weeks -Written scripts for pain signed and in chart -D/C instructions sheet printed and in chart  Signed: Justice Britain 08/06/2017, 10:43 AM

## 2017-08-13 ENCOUNTER — Other Ambulatory Visit: Payer: Self-pay | Admitting: Urology

## 2017-08-13 DIAGNOSIS — N35011 Post-traumatic bulbous urethral stricture: Secondary | ICD-10-CM

## 2017-08-19 ENCOUNTER — Other Ambulatory Visit: Payer: Self-pay

## 2017-08-19 ENCOUNTER — Ambulatory Visit: Payer: BC Managed Care – PPO | Admitting: Internal Medicine

## 2017-08-19 ENCOUNTER — Encounter: Payer: Self-pay | Admitting: Internal Medicine

## 2017-08-19 VITALS — BP 144/86 | HR 105 | Temp 97.6°F | Ht 74.0 in | Wt 242.4 lb

## 2017-08-19 DIAGNOSIS — Z981 Arthrodesis status: Secondary | ICD-10-CM | POA: Diagnosis not present

## 2017-08-19 DIAGNOSIS — M5416 Radiculopathy, lumbar region: Secondary | ICD-10-CM

## 2017-08-19 DIAGNOSIS — Z79899 Other long term (current) drug therapy: Secondary | ICD-10-CM | POA: Diagnosis not present

## 2017-08-19 DIAGNOSIS — Z79891 Long term (current) use of opiate analgesic: Secondary | ICD-10-CM

## 2017-08-19 DIAGNOSIS — M5410 Radiculopathy, site unspecified: Secondary | ICD-10-CM

## 2017-08-19 DIAGNOSIS — I10 Essential (primary) hypertension: Secondary | ICD-10-CM

## 2017-08-19 DIAGNOSIS — Z960 Presence of urogenital implants: Secondary | ICD-10-CM | POA: Diagnosis not present

## 2017-08-19 MED ORDER — HYDROCHLOROTHIAZIDE 25 MG PO TABS: 25.0000 mg | ORAL_TABLET | Freq: Every day | ORAL | 0 refills | Status: DC

## 2017-08-19 NOTE — Patient Instructions (Addendum)
Anthony Campos it was nice seeing you today.  -Dose of hydrochlorothiazide has been increased: Take 25 mg by mouth once daily.  -Please go to your appointment with urology.  FOLLOW-UP INSTRUCTIONS When: 4-6 weeks For: Blood pressure recheck What to bring: Medications

## 2017-08-19 NOTE — Assessment & Plan Note (Signed)
Patient underwent posterior lumbar fusion surgery on July 24, 2017.  He is currently wearing a back brace and using a cane for ambulation.  He is taking Norco and Zanaflex for pain.  He continues to have a suprapubic catheter which was initially placed at the time of his surgery last month in the setting of a urethral stricture.  He has an appointment with urology on February 18.  Plan -Advised him to continue pain management as recommended by surgery -Follow-up with surgery -Explained to him that having a suprapubic catheter puts him at risk for infections.  Encouraged him to go to his appointment with urology on February 18.

## 2017-08-19 NOTE — Progress Notes (Signed)
   CC: Follow-up of hypertension  HPI:  [name] is a 62 y.o. male with a past medical history of conditions listed below presenting to the clinic for a regular checkup.  Hypertension and recent spinal surgery were discussed during this visit. Please see problem based charting for the status of the patient's current and chronic medical conditions.   Past Medical History:  Diagnosis Date  . Erectile dysfunction   . Hyperlipidemia   . Hypertension   . Infection of skin and subcutaneous tissue   . Lipoma   . Tobacco abuse    hx   Review of Systems:  Review of Systems  Constitutional: Negative for chills and fever.  Respiratory: Negative for shortness of breath.   Cardiovascular: Negative for leg swelling.  Gastrointestinal: Negative for nausea and vomiting.    Physical Exam:  Vitals:   08/19/17 1542 08/19/17 1618  BP: (!) 151/88 (!) 144/86  Pulse: (!) 101 (!) 105  Temp: 97.6 F (36.4 C)   TempSrc: Oral   SpO2: 98%   Weight: 242 lb 6.4 oz (110 kg)   Height: 6\' 2"  (1.88 m)    Physical Exam  Constitutional: He is oriented to person, place, and time. He appears well-developed and well-nourished.  [name] present to assist with ambulation.  HENT:  Head: Normocephalic and atraumatic.  Mouth/Throat: Oropharynx is clear and moist.  Eyes: Right eye exhibits no discharge. Left eye exhibits no discharge.  Cardiovascular: Normal rate, regular rhythm and intact distal pulses.  Pulmonary/Chest: Effort normal and breath sounds normal. No respiratory distress. He has no wheezes. He has no rales.  Abdominal: Soft. Bowel sounds are normal. He exhibits no distension. There is no tenderness.  Musculoskeletal: He exhibits no edema.  Wearing a back brace  Neurological: He is alert and oriented to person, place, and time.  Skin: Skin is warm and dry. He is not diaphoretic.    Assessment & Plan:   See Encounters Tab for problem based charting.  Patient discussed with Dr. Lynnae January

## 2017-08-19 NOTE — Assessment & Plan Note (Addendum)
BP Readings from Last 3 Encounters:  08/19/17 (!) 144/86  07/26/17 (!) 133/95  07/21/17 127/75    Lab Results  Component Value Date   NA 140 07/24/2017   K 4.0 07/24/2017   CREATININE 1.01 07/21/2017    Assessment: Blood pressure control:  Above goal Comments: Initial blood pressure 151/88 and repeat 144/86 at this visit.  He is currently taking hydrochlorothiazide 12.5 mg once daily.  BMP done a month ago showing stable renal function and normal electrolytes.   Plan: Increase dose to hydrochlorothiazide 25 mg once daily.  Return to the clinic in 4-6 weeks for blood pressure recheck.

## 2017-08-20 NOTE — Progress Notes (Signed)
Internal Medicine Clinic Attending  Case discussed with Dr. Rathoreat the time of the visit. We reviewed the resident's history and exam and pertinent patient test results. I agree with the assessment, diagnosis, and plan of care documented in the resident's note.  

## 2017-08-25 ENCOUNTER — Ambulatory Visit (HOSPITAL_COMMUNITY)
Admission: RE | Admit: 2017-08-25 | Discharge: 2017-08-25 | Disposition: A | Discharge status: Home / Self Care | Payer: BC Managed Care – PPO | Source: Ambulatory Visit | Attending: Urology | Admitting: Urology

## 2017-08-25 ENCOUNTER — Ambulatory Visit (HOSPITAL_COMMUNITY): Payer: BC Managed Care – PPO

## 2017-08-25 DIAGNOSIS — N35011 Post-traumatic bulbous urethral stricture: Secondary | ICD-10-CM | POA: Diagnosis present

## 2017-08-25 MED ORDER — IOTHALAMATE MEGLUMINE 17.2 % UR SOLN: 250.0000 mL | Freq: Once | URETHRAL | Status: DC | PRN

## 2017-08-27 ENCOUNTER — Encounter (HOSPITAL_BASED_OUTPATIENT_CLINIC_OR_DEPARTMENT_OTHER): Payer: Self-pay

## 2017-08-27 ENCOUNTER — Other Ambulatory Visit: Payer: Self-pay | Admitting: Urology

## 2017-08-27 ENCOUNTER — Other Ambulatory Visit: Payer: Self-pay

## 2017-08-27 NOTE — Progress Notes (Signed)
Spoke with:  Anthony Campos NPO:  After Midnight, no gum, candy, or mints  Arrival time: 0745AM Labs: Istat8 AM medications: None Pre op orders: No needs second sign Ride home:  Anthony Campos ride home Anthony Campos (wife) [phone]

## 2017-08-28 ENCOUNTER — Encounter (HOSPITAL_BASED_OUTPATIENT_CLINIC_OR_DEPARTMENT_OTHER)
Admission: RE | Disposition: A | Discharge status: Home / Self Care | Payer: Self-pay | Source: Ambulatory Visit | Attending: Urology

## 2017-08-28 ENCOUNTER — Encounter (HOSPITAL_BASED_OUTPATIENT_CLINIC_OR_DEPARTMENT_OTHER): Payer: Self-pay

## 2017-08-28 ENCOUNTER — Ambulatory Visit (HOSPITAL_BASED_OUTPATIENT_CLINIC_OR_DEPARTMENT_OTHER): Payer: BC Managed Care – PPO | Admitting: Anesthesiology

## 2017-08-28 ENCOUNTER — Ambulatory Visit (HOSPITAL_BASED_OUTPATIENT_CLINIC_OR_DEPARTMENT_OTHER)
Admission: RE | Admit: 2017-08-28 | Discharge: 2017-08-28 | Disposition: A | Discharge status: Home / Self Care | Payer: BC Managed Care – PPO | Source: Ambulatory Visit | Attending: Urology | Admitting: Urology

## 2017-08-28 DIAGNOSIS — I10 Essential (primary) hypertension: Secondary | ICD-10-CM | POA: Insufficient documentation

## 2017-08-28 DIAGNOSIS — Z79899 Other long term (current) drug therapy: Secondary | ICD-10-CM | POA: Insufficient documentation

## 2017-08-28 DIAGNOSIS — N35812 Other urethral bulbous stricture, male: Secondary | ICD-10-CM | POA: Insufficient documentation

## 2017-08-28 DIAGNOSIS — F17210 Nicotine dependence, cigarettes, uncomplicated: Secondary | ICD-10-CM | POA: Diagnosis not present

## 2017-08-28 DIAGNOSIS — E785 Hyperlipidemia, unspecified: Secondary | ICD-10-CM | POA: Insufficient documentation

## 2017-08-28 HISTORY — PX: CYSTOSCOPY WITH URETHRAL DILATATION: SHX5125

## 2017-08-28 LAB — POCT I-STAT, CHEM 8
BUN: 19 mg/dL (ref 6–20)
Calcium, Ion: 1.19 mmol/L (ref 1.15–1.40)
Chloride: 101 mmol/L (ref 101–111)
Creatinine, Ser: 0.9 mg/dL (ref 0.61–1.24)
Glucose, Bld: 118 mg/dL — ABNORMAL HIGH (ref 65–99)
HEMATOCRIT: 33 % — AB (ref 39.0–52.0)
HEMOGLOBIN: 11.2 g/dL — AB (ref 13.0–17.0)
POTASSIUM: 3.6 mmol/L (ref 3.5–5.1)
SODIUM: 141 mmol/L (ref 135–145)
TCO2: 25 mmol/L (ref 22–32)

## 2017-08-28 SURGERY — CYSTOSCOPY, WITH URETHRAL DILATION
Anesthesia: General | Site: Bladder

## 2017-08-28 MED ORDER — LIDOCAINE 2% (20 MG/ML) 5 ML SYRINGE
INTRAMUSCULAR | Status: DC | PRN
  Administered 2017-08-28: 80 mg via INTRAVENOUS

## 2017-08-28 MED ORDER — DEXAMETHASONE SODIUM PHOSPHATE 10 MG/ML IJ SOLN
INTRAMUSCULAR | Status: AC
  Filled 2017-08-28: qty 1

## 2017-08-28 MED ORDER — ONDANSETRON HCL 4 MG/2ML IJ SOLN
INTRAMUSCULAR | Status: DC | PRN
  Administered 2017-08-28: 4 mg via INTRAVENOUS

## 2017-08-28 MED ORDER — SULFAMETHOXAZOLE-TRIMETHOPRIM 800-160 MG PO TABS: 1.0000 | ORAL_TABLET | Freq: Two times a day (BID) | ORAL | 0 refills | Status: DC

## 2017-08-28 MED ORDER — DEXAMETHASONE SODIUM PHOSPHATE 4 MG/ML IJ SOLN
INTRAMUSCULAR | Status: DC | PRN
  Administered 2017-08-28: 10 mg via INTRAVENOUS

## 2017-08-28 MED ORDER — FENTANYL CITRATE (PF) 100 MCG/2ML IJ SOLN
INTRAMUSCULAR | Status: AC
  Filled 2017-08-28: qty 2

## 2017-08-28 MED ORDER — PROPOFOL 10 MG/ML IV BOLUS
INTRAVENOUS | Status: DC | PRN
  Administered 2017-08-28: 250 mg via INTRAVENOUS
  Administered 2017-08-28: 50 mg via INTRAVENOUS

## 2017-08-28 MED ORDER — CEFAZOLIN SODIUM-DEXTROSE 2-4 GM/100ML-% IV SOLN
INTRAVENOUS | Status: AC
  Filled 2017-08-28: qty 100

## 2017-08-28 MED ORDER — FENTANYL CITRATE (PF) 100 MCG/2ML IJ SOLN
25.0000 ug | INTRAMUSCULAR | Status: DC | PRN
  Filled 2017-08-28: qty 1

## 2017-08-28 MED ORDER — KETOROLAC TROMETHAMINE 30 MG/ML IJ SOLN
INTRAMUSCULAR | Status: DC | PRN
  Administered 2017-08-28: 30 mg via INTRAVENOUS

## 2017-08-28 MED ORDER — OXYCODONE HCL 5 MG/5ML PO SOLN
5.0000 mg | Freq: Once | ORAL | Status: DC | PRN
  Filled 2017-08-28: qty 5

## 2017-08-28 MED ORDER — MIDAZOLAM HCL 5 MG/5ML IJ SOLN
INTRAMUSCULAR | Status: DC | PRN
  Administered 2017-08-28: 2 mg via INTRAVENOUS

## 2017-08-28 MED ORDER — FENTANYL CITRATE (PF) 100 MCG/2ML IJ SOLN
INTRAMUSCULAR | Status: DC | PRN
  Administered 2017-08-28 (×4): 25 ug via INTRAVENOUS
  Administered 2017-08-28: 50 ug via INTRAVENOUS
  Administered 2017-08-28 (×2): 25 ug via INTRAVENOUS

## 2017-08-28 MED ORDER — ONDANSETRON HCL 4 MG/2ML IJ SOLN
INTRAMUSCULAR | Status: AC
  Filled 2017-08-28: qty 2

## 2017-08-28 MED ORDER — OXYCODONE HCL 5 MG PO TABS
5.0000 mg | ORAL_TABLET | Freq: Once | ORAL | Status: DC | PRN
  Filled 2017-08-28: qty 1

## 2017-08-28 MED ORDER — SODIUM CHLORIDE 0.9 % IR SOLN: Status: DC | PRN

## 2017-08-28 MED ORDER — CEFAZOLIN SODIUM-DEXTROSE 2-4 GM/100ML-% IV SOLN
2.0000 g | INTRAVENOUS | Status: AC
  Administered 2017-08-28: 2 g via INTRAVENOUS
  Filled 2017-08-28: qty 100

## 2017-08-28 MED ORDER — IOHEXOL 300 MG/ML  SOLN
INTRAMUSCULAR | Status: DC | PRN
  Administered 2017-08-28: 10 mL via URETHRAL

## 2017-08-28 MED ORDER — LIDOCAINE 2% (20 MG/ML) 5 ML SYRINGE
INTRAMUSCULAR | Status: AC
  Filled 2017-08-28: qty 5

## 2017-08-28 MED ORDER — STERILE WATER FOR IRRIGATION IR SOLN
Status: DC | PRN
  Administered 2017-08-28: 3000 mL

## 2017-08-28 MED ORDER — MIDAZOLAM HCL 2 MG/2ML IJ SOLN
INTRAMUSCULAR | Status: AC
  Filled 2017-08-28: qty 2

## 2017-08-28 MED ORDER — LACTATED RINGERS IV SOLN
INTRAVENOUS | Status: DC
  Administered 2017-08-28 (×2): via INTRAVENOUS
  Filled 2017-08-28: qty 1000

## 2017-08-28 MED ORDER — PROMETHAZINE HCL 25 MG/ML IJ SOLN
6.2500 mg | INTRAMUSCULAR | Status: DC | PRN
  Filled 2017-08-28: qty 1

## 2017-08-28 MED ORDER — PROPOFOL 10 MG/ML IV BOLUS
INTRAVENOUS | Status: AC
  Filled 2017-08-28: qty 40

## 2017-08-28 SURGICAL SUPPLY — 29 items
BAG DRAIN URO-CYSTO SKYTR STRL (DRAIN) ×3 IMPLANT
BAG DRN ANRFLXCHMBR STRAP LEK (BAG) ×1
BAG DRN UROCATH (DRAIN) ×1
BAG URINE LEG 19OZ MD ST LTX (BAG) ×2 IMPLANT
BALLN NEPHROSTOMY (BALLOONS) ×3
BALLOON NEPHROSTOMY (BALLOONS) ×1 IMPLANT
BANDAGE ADH SHEER 1  50/CT (GAUZE/BANDAGES/DRESSINGS) ×2 IMPLANT
CATH FOLEY 2W COUNCIL 5CC 16FR (CATHETERS) ×2 IMPLANT
CATH FOLEY 2WAY SLVR  5CC 20FR (CATHETERS)
CATH FOLEY 2WAY SLVR  5CC 22FR (CATHETERS)
CATH FOLEY 2WAY SLVR 5CC 20FR (CATHETERS) IMPLANT
CATH FOLEY 2WAY SLVR 5CC 22FR (CATHETERS) IMPLANT
CATH INTERMIT  6FR 70CM (CATHETERS) ×2 IMPLANT
CLOTH BEACON ORANGE TIMEOUT ST (SAFETY) ×6 IMPLANT
GLOVE BIO SURGEON STRL SZ8 (GLOVE) ×3 IMPLANT
GOWN STRL REUS W/ TWL LRG LVL3 (GOWN DISPOSABLE) ×1 IMPLANT
GOWN STRL REUS W/ TWL XL LVL3 (GOWN DISPOSABLE) ×1 IMPLANT
GOWN STRL REUS W/TWL LRG LVL3 (GOWN DISPOSABLE) ×3
GOWN STRL REUS W/TWL XL LVL3 (GOWN DISPOSABLE) ×3
GUIDEWIRE 0.038 PTFE COATED (WIRE) IMPLANT
GUIDEWIRE ANG ZIPWIRE 038X150 (WIRE) IMPLANT
GUIDEWIRE STR DUAL SENSOR (WIRE) ×2 IMPLANT
KIT RM TURNOVER CYSTO AR (KITS) ×3 IMPLANT
MANIFOLD NEPTUNE II (INSTRUMENTS) ×2 IMPLANT
NS IRRIG 500ML POUR BTL (IV SOLUTION) IMPLANT
PACK CYSTO (CUSTOM PROCEDURE TRAY) ×3 IMPLANT
TUBE CONNECTING 12'X1/4 (SUCTIONS) ×1
TUBE CONNECTING 12X1/4 (SUCTIONS) ×1 IMPLANT
WATER STERILE IRR 3000ML UROMA (IV SOLUTION) ×3 IMPLANT

## 2017-08-28 NOTE — H&P (Signed)
H&P  Chief Complaint: Urethral stricture  History of Present Illness: 62 year old male with a urethral stricture presents for cystoscopy/balloon dilation of the stricture. He presented to my attention approximately a month ago during anterior spine procedure--a catheter could not be placed prior to the procedure. Cystoscopy revealed obliteration of the bulbous urethra--a sp tube was placed. Recent cystourethrogram revealed no significant obstruction, but followup cysto in the office revealed continued stricture.  Past Medical History:  Diagnosis Date  . Erectile dysfunction   . Hyperlipidemia   . Hypertension   . Infection of skin and subcutaneous tissue   . Lipoma   . Tobacco abuse    hx    Past Surgical History:  Procedure Laterality Date  . ABDOMINAL EXPOSURE N/A 07/23/2017   Procedure: ABDOMINAL EXPOSURE;  Surgeon: Rosetta Posner, MD;  Location: [facility] OR;  Service: Vascular;  Laterality: N/A;  . ANTERIOR LAT LUMBAR FUSION N/A 07/23/2017   Procedure: LUMBAR 3-4 ANTERIOR LATERAL LUMBAR FUSION 1 LEVEL;  Surgeon: Phylliss Bob, MD;  Location: [place];  Service: Orthopedics;  Laterality: N/A;  . ANTERIOR LUMBAR FUSION N/A 07/23/2017   Procedure: LUMBAR 4-5, LUMBAR 5-SACRUM 1, ANTERIOR LUMBAR INTERBODY FUSION WITH INSTRUMENTATION, ALLOGRAFT REQUESTED TIME 6 HRS;  Surgeon: Phylliss Bob, MD;  Location: [place];  Service: Orthopedics;  Laterality: N/A;  LUMBAR 4-5, LUMBAR 5-SACRUM 1, ANTERIOR INTERBODY FUSION WITH INSTRUMENTATION AND ALLOGRAFT REQUESTED TIME 6 HRS  . COLONOSCOPY  04/12/2016   Dr. Ardis Hughs removed one polyp  . CYSTOSCOPY  07/23/2017   Procedure: CYSTOSCOPY FLEXIBLE, ATTEMPTED CATHETER INSERTION;  Surgeon: Phylliss Bob, MD;  Location: [place];  Service: Orthopedics;;  . HERNIA REPAIR  2005   Right hernia repair   . INSERTION OF SUPRAPUBIC CATHETER Right 07/23/2017   Procedure: INSERTION OF SUPRAPUBIC CATHETER;  Surgeon: Phylliss Bob, MD;  Location: [place];  Service: Orthopedics;   Laterality: Right;    Home Medications:  Allergies as of 08/28/2017   No Known Allergies     Medication List    Notice   Cannot display discharge medications because the patient has not yet been admitted.     Allergies: No Known Allergies  Family History  Problem Relation Age of Onset  . Depression Sister   . Heart disease Neg Hx   . Hypertension Neg Hx   . Stroke Neg Hx   . Colon polyps Neg Hx   . Colon cancer Neg Hx   . Rectal cancer Neg Hx   . Stomach cancer Neg Hx   . Esophageal cancer Neg Hx     Social History:  reports that he has been smoking cigarettes.  He has been smoking about 0.25 packs per day. he has never used smokeless tobacco. He reports that he drinks alcohol. He reports that he does not use drugs.  ROS: A complete review of systems was performed.  All systems are negative except for pertinent findings as noted.  Physical Exam:  Vital signs in last 24 hours: Weight:  [223 lb (101.2 kg)] 223 lb (101.2 kg) (02/20 1418) Constitutional:  Alert and oriented, No acute distress Cardiovascular: Regular rate and rhythm, No JVD Respiratory: Normal respiratory effort, Lungs clear bilaterally GI: Abdomen is soft, nontender, nondistended, no abdominal masses Genitourinary: No CVAT. Normal male phallus, testes are descended bilaterally and non-tender and without masses, scrotum is normal in appearance without lesions or masses, perineum is normal on inspection. Lymphatic: No lymphadenopathy Neurologic: Grossly intact, no focal deficits Psychiatric: Normal mood and affect  Laboratory  Data:  No results for input(s): WBC, HGB, HCT, PLT in the last 72 hours.  No results for input(s): NA, K, CL, GLUCOSE, BUN, CALCIUM, CREATININE in the last 72 hours.  Invalid input(s): CO3   No results found for this or any previous visit (from the past 24 hour(s)). No results found for this or any previous visit (from the past 240 hour(s)).  Renal Function: No results for  input(s): CREATININE in the last 168 hours. CrCl cannot be calculated (Patient's most recent lab result is older than the maximum 21 days allowed.).  Radiologic Imaging: No results found.  Impression/Assessment:  Urethral stricture  Plan:  Cysto, balloon dilation of stricture, removal of sp tube

## 2017-08-28 NOTE — Discharge Instructions (Signed)
1. You may see some blood in the urine and may have some burning with urination for 48-72 hours. You also may notice that you have to urinate more frequently or urgently after your procedure which is normal.  2. You should call should you develop an inability urinate, fever > 101, persistent nausea and vomiting that prevents you from eating or drinking to stay hydrated.  3. If you have a stent, you will likely urinate more frequently and urgently until the stent is removed and you may experience some discomfort/pain in the lower abdomen and flank especially when urinating. You may take pain medication prescribed to you if needed for pain. You may also intermittently have blood in the urine until the stent is removed. 4. If you have a catheter, you will be taught how to take care of the catheter by the nursing staff prior to discharge from the hospital.  You may periodically feel a strong urge to void with the catheter in place.  This is a bladder spasm and most often can occur when having a bowel movement or moving around. It is typically self-limited and usually will stop after a few minutes.  You may use some Vaseline or Neosporin around the tip of the catheter to reduce friction at the tip of the penis. You may also see some blood in the urine.  A very small amount of blood can make the urine look quite red.  As long as the catheter is draining well, there usually is not a problem.  However, if the catheter is not draining well and is bloody, you should call the office [phone]) to notify us.   YOU MAY REMOVE THE CATHETER FRIDAY MORNING AS INSTRUCTED BY THE NURSES   Post Anesthesia Home Care Instructions  Activity: Get plenty of rest for the remainder of the day. A responsible individual must stay with you for 24 hours following the procedure.  For the next 24 hours, DO NOT: -Drive a car -Paediatric nurse -Drink alcoholic beverages -Take any medication unless instructed by your  physician -Make any legal decisions or sign important papers.  Meals: Start with liquid foods such as gelatin or soup. Progress to regular foods as tolerated. Avoid greasy, spicy, heavy foods. If nausea and/or vomiting occur, drink only clear liquids until the nausea and/or vomiting subsides. Call your physician if vomiting continues.  Special Instructions/Symptoms: Your throat may feel dry or sore from the anesthesia or the breathing tube placed in your throat during surgery. If this causes discomfort, gargle with warm salt water. The discomfort should disappear within 24 hours.  If you had a scopolamine patch placed behind your ear for the management of post- operative nausea and/or vomiting:  1. The medication in the patch is effective for 72 hours, after which it should be removed.  Wrap patch in a tissue and discard in the trash. Wash hands thoroughly with soap and water. 2. You may remove the patch earlier than 72 hours if you experience unpleasant side effects which may include dry mouth, dizziness or visual disturbances. 3. Avoid touching the patch. Wash your hands with soap and water after contact with the patch.

## 2017-08-28 NOTE — Anesthesia Preprocedure Evaluation (Addendum)
Anesthesia Evaluation  Patient identified by MRN, date of birth, ID band Patient awake    Reviewed: Allergy & Precautions, NPO status , Patient's Chart, lab work & pertinent test results  Airway Mallampati: II  TM Distance: >3 FB Neck ROM: Full    Dental  (+) Dental Advisory Given   Pulmonary Current Smoker,    Pulmonary exam normal breath sounds clear to auscultation       Cardiovascular hypertension, Pt. on medications Normal cardiovascular exam Rhythm:Regular Rate:Normal     Neuro/Psych negative neurological ROS  negative psych ROS   GI/Hepatic negative GI ROS, Neg liver ROS,   Endo/Other  negative endocrine ROS  Renal/GU negative Renal ROS  negative genitourinary   Musculoskeletal negative musculoskeletal ROS (+)   Abdominal   Peds  Hematology negative hematology ROS (+)   Anesthesia Other Findings   Reproductive/Obstetrics                             Anesthesia Physical Anesthesia Plan  ASA: II  Anesthesia Plan: General   Post-op Pain Management:    Induction: Intravenous  PONV Risk Score and Plan: 3 and Treatment may vary due to age or medical condition, Ondansetron, Dexamethasone and Scopolamine patch - Pre-op  Airway Management Planned: LMA  Additional Equipment: None  Intra-op Plan:   Post-operative Plan: Extubation in OR  Informed Consent: I have reviewed the patients History and Physical, chart, labs and discussed the procedure including the risks, benefits and alternatives for the proposed anesthesia with the patient or authorized representative who has indicated his/her understanding and acceptance.   Dental advisory given  Plan Discussed with: CRNA  Anesthesia Plan Comments:         Anesthesia Quick Evaluation

## 2017-08-28 NOTE — Transfer of Care (Signed)
Immediate Anesthesia Transfer of Care Note  Patient: Anthony Campos  Procedure(s) Performed: Procedure(s) (LRB): CYSTOSCOPY WITH URETHRAL DILATATION/BALLOON DILATION, Removal of suprapubic catheter (N/A)  Patient Location: PACU  Anesthesia Type: General  Level of Consciousness: awake, sedated, patient cooperative and responds to stimulation  Airway & Oxygen Therapy: Patient Spontanous Breathing and Patient connected to [place] O2  Post-op Assessment: Report given to PACU RN, Post -op Vital signs reviewed and stable and Patient moving all extremities  Post vital signs: Reviewed and stable  Complications: No apparent anesthesia complications

## 2017-08-28 NOTE — Op Note (Signed)
Preoperative diagnosis: Bulbous urethral stricture  Postoperative diagnosis: Same  Principal procedure: Cystoscopy, balloon dilation of bulbous urethral stricture  Surgeon: Lainee Lehrman  Anesthesia: General with LMA  Complications: None  Specimens: None  Drains: 21 French council tip catheter to leg bag  Indications: 62 year old male who recently had a significant back procedure.  During the initial part of the procedure, Foley catheter placement by the nursing staff in the operating room was unsuccessful, and met with obstruction and bleeding.  Urgent urologic consultation was given, and I was unable to identify any lumen of the urethra to pass a guidewire/catheter into the bladder.  He ended up having a suprapubic tube placed prior to his back procedure.  The patient had a recent cystoscopy in the office which revealed persistent urethral stricture, and cystogram/voiding study revealed adequate passage of contrast through the urethra.  He presents at this time for cystoscopy and balloon dilation of urethral stricture.  Procedure as well as risks and complications as well as the need to have a catheter left overnight have been discussed with the patient and his wife who desire to proceed.  Description of procedure: The patient was properly identified in the holding area.  2 g of cefazolin was administered intravenously.  He was then taken to the operating room where general anesthetic was administered with the LMA.  He was placed in the dorsolithotomy position.  Genitalia and perineum were prepped and draped, proper timeout performed.  A 22 French panendoscope was advanced under direct vision through his urethra.  At the bulbous urethra, there was a significant stricture, approximately 10 [place], identified.  The scope could not be passed.  I then passed a 0.038 inch sensor tip guidewire using fluoroscopic guidance with a curl seen in the bladder.  The scope was then removed and a 24 [place]  NephroMax balloon was passed fluoroscopically with with the tip into the bladder.  The balloon with is then inflated to 20 atm of pressure for approximately 5 minutes.  At this point, the balloon was deflated.  The cystoscope was then passed with excellent dilation of the stricture seen.  Prostate was nonobstructive.  The bladder was inspected circumferentially.  There were mild trabeculations.  The suprapubic tube was noted in the anterior bladder.  No bladder lesions were seen.  With the scope was then removed.  Using the guidewire, a 52 [place] council tip catheter was then passed over top of the guidewire into the bladder.  The balloon was filled with 10 cc of water and hooked to a leg bag.  The suprapubic tube was then removed, and the entry site was cleansed and dressed with a Band-Aid.  The patient was then awakened and taken to the PACU, having tolerated the procedure well.

## 2017-08-28 NOTE — Anesthesia Postprocedure Evaluation (Signed)
Anesthesia Post Note  Patient: Nicki Gracy  Procedure(s) Performed: CYSTOSCOPY WITH URETHRAL DILATATION/BALLOON DILATION, Removal of suprapubic catheter (N/A Bladder)     Patient location during evaluation: PACU Anesthesia Type: General Level of consciousness: awake and alert Pain management: pain level controlled Vital Signs Assessment: post-procedure vital signs reviewed and stable Respiratory status: spontaneous breathing, nonlabored ventilation and respiratory function stable Cardiovascular status: blood pressure returned to baseline and stable Postop Assessment: no apparent nausea or vomiting Anesthetic complications: no    Last Vitals:  Vitals:   08/28/17 1115 08/28/17 1130  BP: (!) 127/94 137/90  Pulse: 80 79  Resp: 17 20  Temp:    SpO2: 98% 97%    Last Pain:  Vitals:   08/28/17 0745  TempSrc: Oral                 Audry Pili

## 2017-08-28 NOTE — Anesthesia Procedure Notes (Signed)
Procedure Name: LMA Insertion Date/Time: 08/28/2017 10:10 AM Performed by: Justice Rocher, CRNA Pre-anesthesia Checklist: Patient identified, Emergency Drugs available, Suction available and Patient being monitored Patient Re-evaluated:Patient Re-evaluated prior to induction Oxygen Delivery Method: Circle system utilized Preoxygenation: Pre-oxygenation with 100% oxygen Induction Type: IV induction Ventilation: Mask ventilation without difficulty LMA: LMA inserted LMA Size: 5.0 Number of attempts: 1 Airway Equipment and Method: Bite block Placement Confirmation: positive ETCO2 and breath sounds checked- equal and bilateral Tube secured with: Tape Dental Injury: Teeth and Oropharynx as per pre-operative assessment

## 2017-08-29 ENCOUNTER — Encounter (HOSPITAL_BASED_OUTPATIENT_CLINIC_OR_DEPARTMENT_OTHER): Payer: Self-pay | Admitting: Urology

## 2017-09-30 ENCOUNTER — Encounter: Payer: Self-pay | Admitting: Internal Medicine

## 2017-09-30 ENCOUNTER — Other Ambulatory Visit: Payer: Self-pay

## 2017-09-30 ENCOUNTER — Ambulatory Visit (INDEPENDENT_AMBULATORY_CARE_PROVIDER_SITE_OTHER): Payer: BC Managed Care – PPO | Admitting: Internal Medicine

## 2017-09-30 VITALS — BP 128/94 | HR 85 | Temp 98.5°F | Ht 74.0 in | Wt 238.4 lb

## 2017-09-30 DIAGNOSIS — M5410 Radiculopathy, site unspecified: Secondary | ICD-10-CM | POA: Diagnosis not present

## 2017-09-30 DIAGNOSIS — Z79899 Other long term (current) drug therapy: Secondary | ICD-10-CM | POA: Diagnosis not present

## 2017-09-30 DIAGNOSIS — E785 Hyperlipidemia, unspecified: Secondary | ICD-10-CM

## 2017-09-30 DIAGNOSIS — F17200 Nicotine dependence, unspecified, uncomplicated: Secondary | ICD-10-CM | POA: Diagnosis not present

## 2017-09-30 DIAGNOSIS — Z8619 Personal history of other infectious and parasitic diseases: Secondary | ICD-10-CM

## 2017-09-30 DIAGNOSIS — I10 Essential (primary) hypertension: Secondary | ICD-10-CM | POA: Diagnosis not present

## 2017-09-30 NOTE — Patient Instructions (Signed)
Mr. Birdsall it was nice seeing you today.  -Continue taking hydrochlorothiazide 25 mg once daily for high blood pressure.   -I am glad your nausea, vomiting, and diarrhea have now resolved.  Please call us if you start experiencing these symptoms again.

## 2017-10-01 DIAGNOSIS — Z8619 Personal history of other infectious and parasitic diseases: Secondary | ICD-10-CM | POA: Insufficient documentation

## 2017-10-01 NOTE — Assessment & Plan Note (Signed)
Patient states he stopped smoking cigarettes 2 months ago.  He is now using e-cigarettes as needed but trying to wean himself off.  Plan -Encouraged him to stop using e-cigarettes

## 2017-10-01 NOTE — Progress Notes (Signed)
Internal Medicine Clinic Attending  Case discussed with Dr. Rathoreat the time of the visit. We reviewed the resident's history and exam and pertinent patient test results. I agree with the assessment, diagnosis, and plan of care documented in the resident's note.  

## 2017-10-01 NOTE — Assessment & Plan Note (Addendum)
Stable.  Patient underwent posterior lumbar fusion surgery in January 2019.  He continues to wear a back brace.  He is not requiring any pain medications.  His suprapubic catheter was removed during his urology visit last month.  Plan -No acute intervention needed at this time

## 2017-10-01 NOTE — Progress Notes (Signed)
   CC: Hypertension follow-up  HPI:  [name] is a 62 y.o. male with a past medical history of conditions listed below presenting to the clinic for a follow-up of hypertension. Please see problem based charting for the status of the patient's current and chronic medical conditions.   Past Medical History:  Diagnosis Date  . Erectile dysfunction   . Hyperlipidemia   . Hypertension   . Infection of skin and subcutaneous tissue   . Lipoma   . Tobacco abuse    hx   Review of Systems: Pertinent positives mentioned in HPI. Remainder of all ROS negative.   Physical Exam:  Vitals:   09/30/17 1541  BP: (!) 128/94  Pulse: 85  Temp: 98.5 F (36.9 C)  TempSrc: Oral  SpO2: 100%  Weight: 238 lb 6.4 oz (108.1 kg)  Height: 6\' 2"  (1.88 m)   Physical Exam  Constitutional: He is oriented to person, place, and time. He appears well-developed and well-nourished. No distress.  HENT:  Mouth/Throat: Oropharynx is clear and moist.  Eyes: Right eye exhibits no discharge. Left eye exhibits no discharge.  Cardiovascular: Normal rate, regular rhythm and intact distal pulses.  Pulmonary/Chest: Effort normal and breath sounds normal. No respiratory distress. He has no wheezes. He has no rales.  Abdominal: Bowel sounds are normal. He exhibits no distension. There is no tenderness.  Musculoskeletal: He exhibits no edema.  Neurological: He is alert and oriented to person, place, and time.  Skin: Skin is warm and dry.  Psychiatric: He has a normal mood and affect. His behavior is normal.    Assessment & Plan:   See Encounters Tab for problem based charting.  Patient discussed with Dr. Evette Doffing

## 2017-10-01 NOTE — Assessment & Plan Note (Signed)
BP Readings from Last 3 Encounters:  09/30/17 (!) 128/94  08/28/17 130/76  08/19/17 (!) 144/86    Lab Results  Component Value Date   NA 141 08/28/2017   K 3.6 08/28/2017   CREATININE 0.90 08/28/2017    Assessment: Progress toward BP goal:   Improved Comments: Currently taking hydrochlorothiazide 25 mg daily.  Plan: Medications:  continue current medications Educational resources provided:   Educated patient about healthy eating and exercise. Emphasized the importance of weight loss.  Other plans: BMP at next visit.

## 2017-10-01 NOTE — Assessment & Plan Note (Signed)
Patient states last week both him and his wife experience nausea, vomiting, and nonbloody diarrhea.  Symptoms resolved 2 days ago.  He is now eating well and drinking plenty of water.  His symptoms were likely due to viral gastroenteritis and have now resolved.  Plan -No acute intervention needed at this time.  Advised him to call the clinic if he starts experiencing symptoms again.

## 2017-11-24 ENCOUNTER — Other Ambulatory Visit: Payer: Self-pay | Admitting: Internal Medicine

## 2017-12-21 ENCOUNTER — Other Ambulatory Visit: Payer: Self-pay | Admitting: Internal Medicine

## 2017-12-31 ENCOUNTER — Encounter: Payer: Self-pay | Admitting: *Deleted

## 2018-01-23 IMAGING — CR DG OR LOCAL ABDOMEN
1 series · 1 of 1 positions shown · non-contrast
Comparison: MRI 04/30/2017

CLINICAL DATA: Lumbar discectomy and fusion.

EXAM:
OR LOCAL ABDOMEN

[AP]
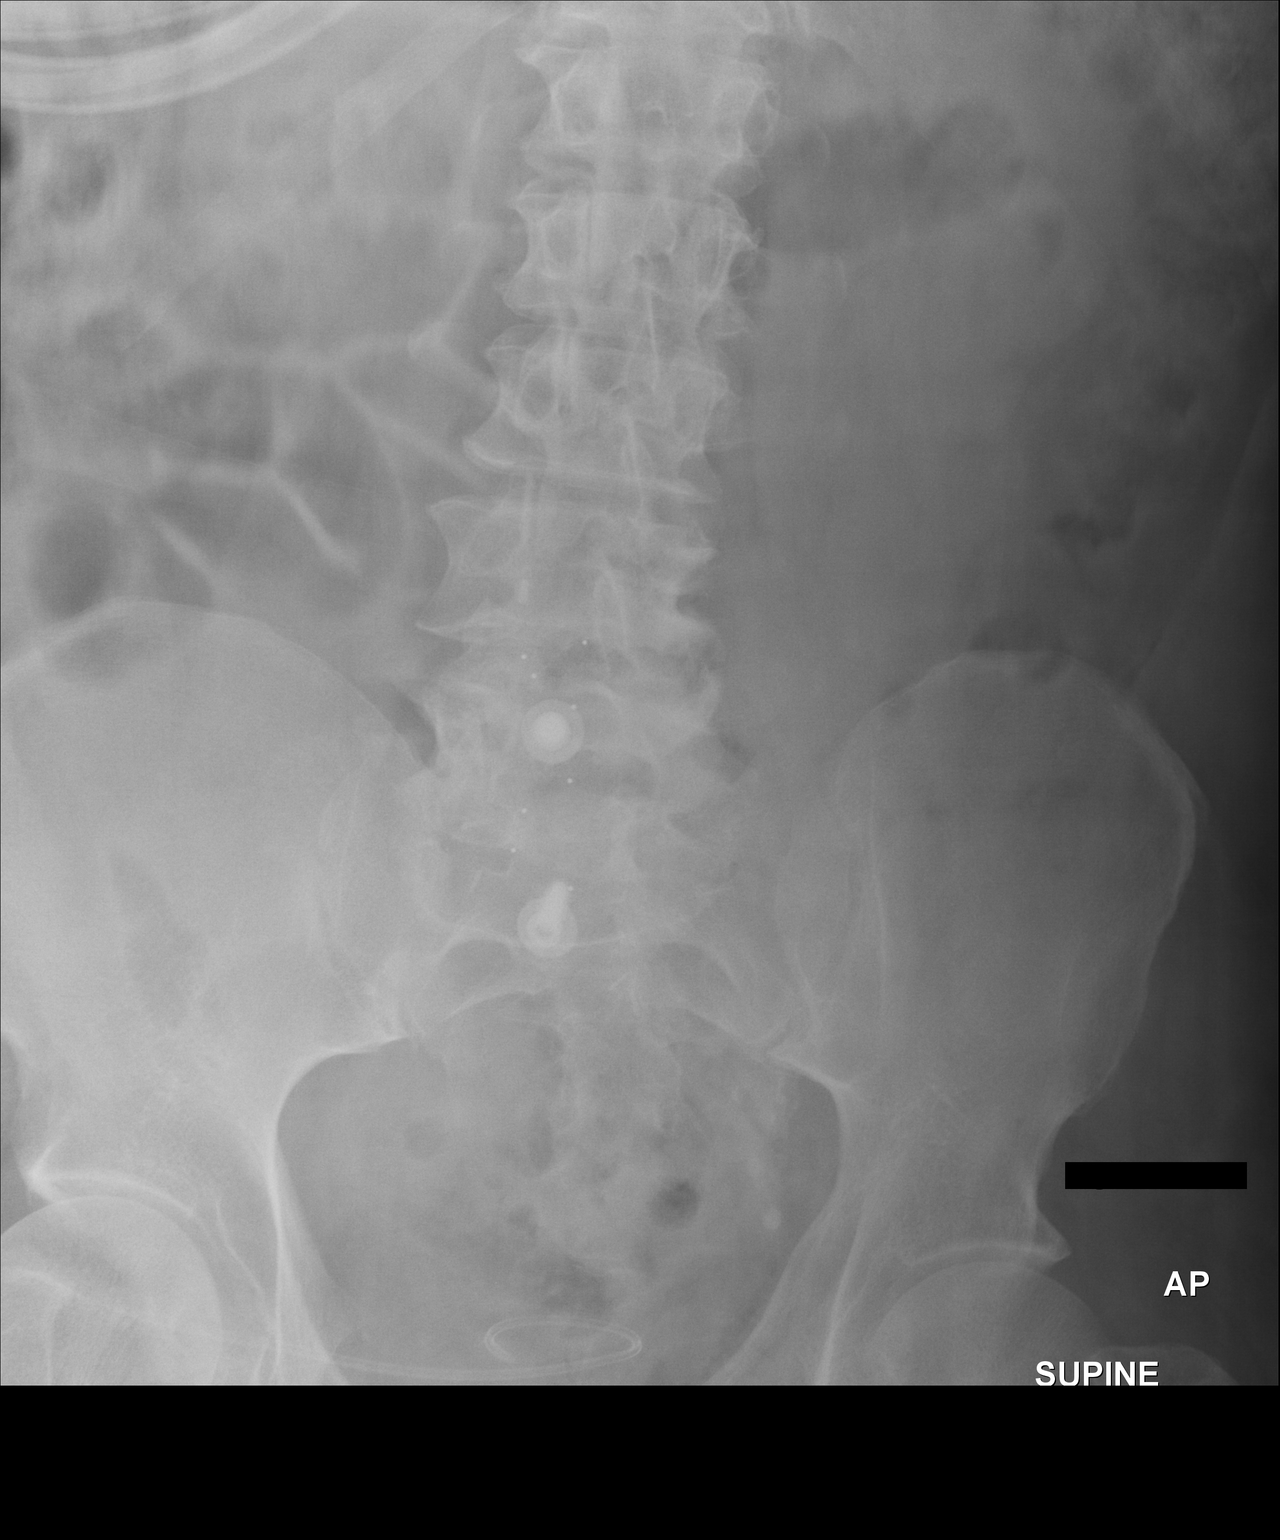

[1 of 1 positions shown; findings below may reference images not displayed]

FINDINGS: AP view shows interbody fusion material at L4-5 and L5-S1. Anterior
screws and washers in place at L5-S1. Catheter present overlying the
pelvis.
IMPRESSION: Discectomy at L4-5 and L5-S1. Interbody fusion material. Anterior
screws and washers L5 and S1.

## 2018-01-29 ENCOUNTER — Ambulatory Visit (INDEPENDENT_AMBULATORY_CARE_PROVIDER_SITE_OTHER): Payer: BC Managed Care – PPO | Admitting: Internal Medicine

## 2018-01-29 ENCOUNTER — Encounter: Payer: Self-pay | Admitting: Internal Medicine

## 2018-01-29 VITALS — BP 132/94 | HR 96 | Temp 98.3°F | Wt 249.3 lb

## 2018-01-29 DIAGNOSIS — N529 Male erectile dysfunction, unspecified: Secondary | ICD-10-CM | POA: Diagnosis not present

## 2018-01-29 DIAGNOSIS — F17200 Nicotine dependence, unspecified, uncomplicated: Secondary | ICD-10-CM

## 2018-01-29 DIAGNOSIS — F528 Other sexual dysfunction not due to a substance or known physiological condition: Secondary | ICD-10-CM

## 2018-01-29 DIAGNOSIS — Z79899 Other long term (current) drug therapy: Secondary | ICD-10-CM

## 2018-01-29 DIAGNOSIS — Z720 Tobacco use: Secondary | ICD-10-CM

## 2018-01-29 DIAGNOSIS — S80861A Insect bite (nonvenomous), right lower leg, initial encounter: Secondary | ICD-10-CM | POA: Diagnosis not present

## 2018-01-29 DIAGNOSIS — W57XXXA Bitten or stung by nonvenomous insect and other nonvenomous arthropods, initial encounter: Principal | ICD-10-CM

## 2018-01-29 MED ORDER — SILDENAFIL CITRATE 20 MG PO TABS: ORAL_TABLET | ORAL | 0 refills | Status: DC

## 2018-01-29 NOTE — Patient Instructions (Addendum)
Mr. Schuh your sore on your leg is looking much better I believe it is healing.  You can use some topical neosporin cream as it heals.  We will refill your sildenafil today as well for your ED.  If your sore begins to look red and irritated, gets larger or you develop fever or chills please return to [place] to take a look.

## 2018-01-29 NOTE — Assessment & Plan Note (Signed)
Thing that the patient wanted to talk about erectile dysfunction and he is skin ulceration with slow healing today I felt this is a good opportunity to continue to encourage tobacco cessation.  Over the fact that both his erectile dysfunction and slow healing of his leg ulceration are related to his tobacco use.  The patient understood and he does have some nicotine patches and he is working to get to the point where he will try them but is not ready yet today.

## 2018-01-29 NOTE — Assessment & Plan Note (Signed)
2 weeks ago the patient felt that he had an insect bite resulting in a small red area on his lower right leg.  He has been treating it with alcohol, peroxide, hydrocortisone cream over-the-counter.  He feels that it is getting better he has no fevers or chills, he is not diabetic or immunocompromised.  He does use tobacco but much less than in years past he is currently smoking every few days a cigarette or 2 versus using E cigarettes.  The skin ulceration looks good, no signs of active infection seems to be resolving started draining some pus on its own.  Do not feel I&D is indicated for this lesion.  -Patient can use some topical Neosporin over-the-counter as the final stage of healing resumes told him to avoid the alcohol and peroxide and hydrocortisone

## 2018-01-29 NOTE — Progress Notes (Signed)
Medicine attending: Medical history, presenting problems, physical findings, and medications, reviewed with resident physician Dr William Winfrey on the day of the patient visit and I concur with his evaluation and management plan. 

## 2018-01-29 NOTE — Assessment & Plan Note (Signed)
Patient reports continued erectile dysfunction he has been treated in the past with this for this with sildenafil 20 to 40 mg as needed with good results.  I see no contraindication to refilling this today for the patient.  He is on no nitrates for chest pain or other medicines that may interact.  I discussed with him that he can talk to his PCP about possibly switching from hydrochlorothiazide to a an agent that is more favorable for erectile dysfunction in the future.    -We will refill sildenafil 20 to 40 mg as needed before sex

## 2018-01-29 NOTE — Progress Notes (Signed)
CC: insect bite with healing skin ulceration, erectile dysfunction, tobacco abuse  HPI:  [name] is a 62 y.o. male with PMH below. He is here today to address insect bite with healing skin ulceration, erectile dysfunction and his tobacco abuse  Please see A&P for status of the patient's chronic medical conditions  Past Medical History:  Diagnosis Date  . Erectile dysfunction   . Hyperlipidemia   . Hypertension   . Infection of skin and subcutaneous tissue   . Lipoma   . Tobacco abuse    hx   Review of Systems:  ROS: Pulmonary: pt denies increased work of breathing, shortness of breath,  Cardiac: pt denies palpitations, chest pain,  Abdominal: pt denies abdominal pain, nausea, vomiting, or diarrhea  Physical Exam:  Vitals:   01/29/18 0906  BP: (!) 132/94  Pulse: 96  Temp: 98.3 F (36.8 C)  TempSrc: Oral  SpO2: 100%  Weight: 249 lb 4.8 oz (113.1 kg)   Physical Exam  Eyes: Right eye exhibits no discharge. Left eye exhibits no discharge. No scleral icterus.  Cardiovascular: Normal rate, regular rhythm and normal heart sounds. Exam reveals no gallop and no friction rub.  No murmur heard. Pulmonary/Chest: Effort normal and breath sounds normal. No respiratory distress. He has no wheezes. He has no rales.  Neurological: He is alert.  Skin: Lesion (small healing ulceration of skin with minimal underlying pus that drains well with palpation.  It is not painful no surrounding erythema or signs of infection) noted. He is not diaphoretic. No erythema.       Media Information         Document Information   Photos    01/29/2018 09:12  Attached To:  Office Visit on 01/29/18 with Katherine Roan, MD   Source Information   Mckyla Deckman, Jenne Pane, MD  [facility]        Social History   Socioeconomic History  . Marital status: Married    Spouse name: Not on file  . Number of children: Not on file  . Years of education: Not on file  .  Highest education level: Not on file  Occupational History  . Not on file  Social Needs  . Financial resource strain: Not on file  . Food insecurity:    Worry: Not on file    Inability: Not on file  . Transportation needs:    Medical: Not on file    Non-medical: Not on file  Tobacco Use  . Smoking status: Current Some Day Smoker    Packs/day: 0.25    Types: Cigarettes  . Smokeless tobacco: Never Used  . Tobacco comment: using electronic cigs for 2 months   Substance and Sexual Activity  . Alcohol use: Yes    Alcohol/week: 0.0 oz    Comment: Beer - rarely.- holidays like thanksgiving and x mas   . Drug use: No  . Sexual activity: Not on file  Lifestyle  . Physical activity:    Days per week: Not on file    Minutes per session: Not on file  . Stress: Not on file  Relationships  . Social connections:    Talks on phone: Not on file    Gets together: Not on file    Attends religious service: Not on file    Active member of club or organization: Not on file    Attends meetings of clubs or organizations: Not on file    Relationship status: Not on file  .  Intimate partner violence:    Fear of current or ex partner: Not on file    Emotionally abused: Not on file    Physically abused: Not on file    Forced sexual activity: Not on file  Other Topics Concern  . Not on file  Social History Narrative   Occupation- Radiation protection practitioner.   Lives with his wife. Has adult children and three grandchildren.    Family History  Problem Relation Age of Onset  . Depression Sister   . Heart disease Neg Hx   . Hypertension Neg Hx   . Stroke Neg Hx   . Colon polyps Neg Hx   . Colon cancer Neg Hx   . Rectal cancer Neg Hx   . Stomach cancer Neg Hx   . Esophageal cancer Neg Hx     Assessment & Plan:   See Encounters Tab for problem based charting.  Patient discussed with Dr. Beryle Beams

## 2018-03-02 ENCOUNTER — Other Ambulatory Visit: Payer: Self-pay | Admitting: *Deleted

## 2018-03-02 MED ORDER — HYDROCHLOROTHIAZIDE 25 MG PO TABS: 25.0000 mg | ORAL_TABLET | Freq: Every day | ORAL | 1 refills | Status: DC

## 2018-03-02 NOTE — Telephone Encounter (Signed)
Pls sch appt PCP Nov - Jan HTN F/U

## 2018-05-21 ENCOUNTER — Encounter: Payer: Self-pay | Admitting: *Deleted

## 2018-05-28 ENCOUNTER — Encounter: Payer: BC Managed Care – PPO | Admitting: Internal Medicine

## 2018-07-16 ENCOUNTER — Encounter (INDEPENDENT_AMBULATORY_CARE_PROVIDER_SITE_OTHER): Payer: Self-pay

## 2018-07-16 ENCOUNTER — Ambulatory Visit: Payer: BC Managed Care – PPO | Admitting: Internal Medicine

## 2018-07-16 ENCOUNTER — Encounter: Payer: Self-pay | Admitting: Internal Medicine

## 2018-07-16 ENCOUNTER — Other Ambulatory Visit: Payer: Self-pay

## 2018-07-16 DIAGNOSIS — M549 Dorsalgia, unspecified: Secondary | ICD-10-CM

## 2018-07-16 DIAGNOSIS — Z720 Tobacco use: Secondary | ICD-10-CM

## 2018-07-16 DIAGNOSIS — Z23 Encounter for immunization: Secondary | ICD-10-CM | POA: Diagnosis not present

## 2018-07-16 DIAGNOSIS — I10 Essential (primary) hypertension: Secondary | ICD-10-CM | POA: Diagnosis not present

## 2018-07-16 DIAGNOSIS — Z0000 Encounter for general adult medical examination without abnormal findings: Secondary | ICD-10-CM

## 2018-07-16 DIAGNOSIS — Z79899 Other long term (current) drug therapy: Secondary | ICD-10-CM

## 2018-07-16 DIAGNOSIS — M5410 Radiculopathy, site unspecified: Secondary | ICD-10-CM | POA: Diagnosis not present

## 2018-07-16 DIAGNOSIS — E785 Hyperlipidemia, unspecified: Secondary | ICD-10-CM | POA: Diagnosis not present

## 2018-07-16 NOTE — Assessment & Plan Note (Signed)
Last lipid panel 16 months ago: Total Chol: 134, LDL 72  -Continue Lipitor 40 mg QD

## 2018-07-16 NOTE — Assessment & Plan Note (Addendum)
Your BP 152/82. He does not check his BP at home.  The patient is currently taking HTCZ 25 mg QD. His last blood pressure visits are.  BP Readings from Last 3 Encounters:  07/16/18 (!) 152/88  01/29/18 (!) 132/94  09/30/17 (!) 128/94    The patient does not report palpitations, dizziness, chest pain, sob.  Assessment and Plan -Ct current meds and will recheck BP next visit to decide if needs additional treatment -BMP

## 2018-07-16 NOTE — Assessment & Plan Note (Signed)
Received Flu vaccin today.

## 2018-07-16 NOTE — Patient Instructions (Addendum)
Thank you for allowing [place] to provide your care today.  You were seen in clinic today for follow-up.  Today we discussed your blood pressure and low back pain, your blood pressure today is mildly elevated 152/82, please avoid salty food and continue taking your medication as before.  We will check your blood pressure next time again and will decide if need to add your medicines.  Please continue physical therapy for your back pain after surgery. Per your request, I will review and fill disability parking access. As with talked, you may see your surgeon for the insurance disability paperwork.  You received Flu shot today. I order some blood test for today and will call you if results come back abnormal.  Please follow-up in clinic in 6 months or earlier as needed.   Should you have any questions or concerns please call the internal medicine clinic at [phone].   Thanks

## 2018-07-16 NOTE — Progress Notes (Signed)
   CC: HTN follow up HPI:  Mr.Melroy Brands is a 63 y.o. male with PMHx as below, came in to the clinic today for HTN follow up. Please see assessment and plan for further details and assessment and plan.   Past Medical History:  Diagnosis Date  . Erectile dysfunction   . Hyperlipidemia   . Hypertension   . Infection of skin and subcutaneous tissue   . Lipoma   . Tobacco abuse    hx   Family Hx: Schizophrenia       Sisters Dementia              Sisters  DM                         Sister  Social Hx:  Some days smokes 1-2 cigarettes Drink occasionally No illicit drug    Review of Systems:  Review of Systems  Constitutional: Negative for chills and fever.  Cardiovascular: Negative for chest pain.  Gastrointestinal: Negative for abdominal pain, constipation, diarrhea, nausea and vomiting.  Musculoskeletal: Positive for back pain.  Skin: Negative for rash.  Neurological: Negative for dizziness and headaches.   Physical Exam:  Vitals:   07/16/18 1320 07/16/18 1324  BP: (!) 152/88   Pulse: 85   Temp: 97.7 F (36.5 C)   TempSrc: Oral   SpO2: (!) 82% 92%  Weight: 255 lb 12.8 oz (116 kg)   Height: 6\' 2"  (1.88 m)    Physical exam: Physical Exam Vitals signs reviewed.  Constitutional:      Appearance: Normal appearance.  HENT:     Head: Normocephalic and atraumatic.  Eyes:     Extraocular Movements: Extraocular movements intact.  Cardiovascular:     Rate and Rhythm: Normal rate and regular rhythm.     Pulses: Normal pulses.     Heart sounds: No murmur.  Pulmonary:     Effort: Pulmonary effort is normal.     Breath sounds: Normal breath sounds. No wheezing, rhonchi or rales.  Abdominal:     General: Bowel sounds are normal. There is no distension.     Palpations: Abdomen is soft.     Tenderness: There is no abdominal tenderness.  Musculoskeletal: Normal range of motion.        General: No tenderness.     Right lower leg: No edema.     Left lower leg: No edema.   Skin:    General: Skin is warm and dry.  Neurological:     Mental Status: He is alert and oriented to person, place, and time. Mental status is at baseline.  Psychiatric:        Mood and Affect: Mood normal.        Behavior: Behavior normal.     Assessment & Plan:   See Encounters Tab for problem based charting.  Patient discussed with Dr. Dareen Piano

## 2018-07-16 NOTE — Assessment & Plan Note (Addendum)
Patient is taking PT at home. He says since the fall and surgery, he is not able to do all things he was able to do. Sometimes has pain at site of surgery. No numbness or tingling.  Requests to extend handicap disability parking parking access.  -Ct physical therapy -f/u with orthopedic surgen Dr. Lynann Bologna  -Ct Gabapentin as needed for pain -Ct using brace

## 2018-07-17 LAB — BMP8+ANION GAP
Anion Gap: 19 mmol/L — ABNORMAL HIGH (ref 10.0–18.0)
BUN / CREAT RATIO: 19 (ref 10–24)
BUN: 18 mg/dL (ref 8–27)
CO2: 22 mmol/L (ref 20–29)
Calcium: 9.2 mg/dL (ref 8.6–10.2)
Chloride: 98 mmol/L (ref 96–106)
Creatinine, Ser: 0.97 mg/dL (ref 0.76–1.27)
GFR calc Af Amer: 96 mL/min/{1.73_m2} (ref >59)
GFR, EST NON AFRICAN AMERICAN: 83 mL/min/{1.73_m2} (ref >59)
Glucose: 90 mg/dL (ref 65–99)
POTASSIUM: 4.4 mmol/L (ref 3.5–5.2)
SODIUM: 139 mmol/L (ref 134–144)

## 2018-07-17 NOTE — Progress Notes (Signed)
Internal Medicine Clinic Attending  Case discussed with Dr. Masoudi  at the time of the visit.  We reviewed the resident's history and exam and pertinent patient test results.  I agree with the assessment, diagnosis, and plan of care documented in the resident's note.  

## 2018-07-17 NOTE — Addendum Note (Signed)
Addended by: Aldine Contes on: 07/17/2018 01:38 PM   Modules accepted: Level of Service

## 2018-07-30 ENCOUNTER — Telehealth: Payer: Self-pay | Admitting: Internal Medicine

## 2018-08-28 ENCOUNTER — Other Ambulatory Visit: Payer: Self-pay | Admitting: Internal Medicine

## 2018-12-17 ENCOUNTER — Other Ambulatory Visit: Payer: Self-pay | Admitting: Internal Medicine

## 2018-12-17 ENCOUNTER — Telehealth: Payer: Self-pay | Admitting: Internal Medicine

## 2018-12-17 ENCOUNTER — Other Ambulatory Visit: Payer: Self-pay | Admitting: *Deleted

## 2018-12-17 MED ORDER — ATORVASTATIN CALCIUM 40 MG PO TABS: 40.0000 mg | ORAL_TABLET | Freq: Every day | ORAL | 0 refills | Status: DC

## 2018-12-17 NOTE — Telephone Encounter (Signed)
Refill Request   atorvastatin (LIPITOR) 40 MG tablet  [id number] - [place], [place]

## 2018-12-17 NOTE — Telephone Encounter (Signed)
Refill Request  atorvastatin (LIPITOR) 40 MG tablet  [id number] - [place], [place]

## 2019-01-18 ENCOUNTER — Other Ambulatory Visit: Payer: Self-pay

## 2019-01-18 ENCOUNTER — Encounter: Payer: Self-pay | Admitting: Internal Medicine

## 2019-01-18 ENCOUNTER — Ambulatory Visit: Payer: BLUE CROSS/BLUE SHIELD | Admitting: Internal Medicine

## 2019-01-18 VITALS — BP 138/102 | HR 82 | Temp 97.8°F | Ht 74.0 in | Wt 250.2 lb

## 2019-01-18 DIAGNOSIS — Z79899 Other long term (current) drug therapy: Secondary | ICD-10-CM

## 2019-01-18 DIAGNOSIS — H1189 Other specified disorders of conjunctiva: Secondary | ICD-10-CM | POA: Diagnosis not present

## 2019-01-18 DIAGNOSIS — E785 Hyperlipidemia, unspecified: Secondary | ICD-10-CM

## 2019-01-18 DIAGNOSIS — Z720 Tobacco use: Secondary | ICD-10-CM | POA: Diagnosis not present

## 2019-01-18 DIAGNOSIS — I10 Essential (primary) hypertension: Secondary | ICD-10-CM | POA: Diagnosis not present

## 2019-01-18 DIAGNOSIS — Z0000 Encounter for general adult medical examination without abnormal findings: Secondary | ICD-10-CM

## 2019-01-18 MED ORDER — HYDROCHLOROTHIAZIDE 25 MG PO TABS: ORAL_TABLET | ORAL | 3 refills | Status: DC

## 2019-01-18 MED ORDER — ATORVASTATIN CALCIUM 40 MG PO TABS: 40.0000 mg | ORAL_TABLET | Freq: Every day | ORAL | 3 refills | Status: DC

## 2019-01-18 NOTE — Assessment & Plan Note (Addendum)
Patient has mild conjunctival erythema on the right eye.  He denies any pain, itching, discharge. Denies any blurred vision. He mentioned that he rubbed his eye this morning and since then noticed some redness.  He reports recent use of chemical in his garden and thinks that if might have irritated his eye.  On exam: PERRLA, external ocular movements are normal and without pain. No discharge or puss. Eye chart exam: Vision is 20/20 bilaterally  Mild conjunctival irritation likely 2/2 chemical exposure. Nothing to do at this point. Will monitor and recommended to keep the eye clean and seek medical attention if no developed pain, discharge, any change in vision or worsening of redness.

## 2019-01-18 NOTE — Progress Notes (Signed)
      CC: Hypertension follow-up  HPI:  [name] is a 63 y.o. with PMHx as documented below, presented for hypertension follow-up. Please refer to problem based charting for further details and assessment of plan of current problem and chronic medical conditions.   Past Medical History:  Diagnosis Date  . Erectile dysfunction   . Hyperlipidemia   . Hypertension   . Infection of skin and subcutaneous tissue   . Lipoma   . Tobacco abuse    hx   Family Hx: Sister with Schizophrenia  Sister with dementia Sister with DM  Social Hx:  Smokes (1-2 cigarettes per day)  Drinks alcohol occasionally Denies illicit drug use  Review of Systems:   Constitutional: Negative for chills and fever.  Cardiovascular: Negative for chest pain, palpitations and leg swelling.  Gastrointestinal: Negative for abdominal pain, nausea and vomiting.  Genitourinary: Negative for dysuria, frequency and urgency.   Physical Exam:  Vitals:   01/18/19 1433 01/18/19 1506  BP: (!) 153/98 (!) 138/102  Pulse: 88 82  Temp: 97.8 F (36.6 C)   TempSrc: Oral   SpO2: 100%   Weight: 250 lb 3.2 oz (113.5 kg)   Height: 6\' 2"  (1.88 m)    Physical Exam  Constitutional: Appears well-developed and well-nourished. No distress.  HENT:  Head: Normocephalic and atraumatic.  Eyes: PERRLA, EOM normal. Mild conjunctival erythema at right eye.  Cardiovascular: Normal rate, regular rhythm and normal heart sounds.  Respiratory: Effort normal and breath sounds normal. No respiratory distress. No wheezes.  GI: Soft. Bowel sounds are normal. No distension. There is no tenderness.  Neurological: Is alert and oriented x 3  Skin: Not diaphoretic. No erythema.  Psychiatric: Normal mood and affect. Behavior is normal. Judgment and thought content normal.    Assessment & Plan:   See Encounters Tab for problem based charting.  Patient discussed with Dr. Rebeca Alert

## 2019-01-18 NOTE — Assessment & Plan Note (Addendum)
Recommended to check HbA1c for DM screening today. Patient prefers to do it later because he will not be able to follow up here due to PCP change per insurance. -Recommending to check HbA1c by end of the year

## 2019-01-18 NOTE — Assessment & Plan Note (Signed)
BP today 153/98--> repeat: 138/102 (He does not check his blood pressure at home.) Patient is currently on HCTZ 25 mg daily. He denies any headache, dizziness, lightheadedness.  Recommending adding a second agent (such as amlodipine 5 mg daily) for better BP cpntrol and follow up in 2 weeks with [place]. How ever patient will probably need to change his PCP per insurance and can not follow up here. He prefers to continue current medications until establish care.  -Continue HCTZ 25 mg QD -F/u with [place] or new PCP in 2 months to recheck BP and add antihypertensive medication as needed

## 2019-01-18 NOTE — Patient Instructions (Addendum)
It was our pleasure taking care of you in our clinic today.  You were seen for follow up of your blood pressure. Your blood pressure today is a little bit elevated. (I hear you about you may not be able to follow up with [place] due to change of primary care doctor per your insurance and prefer not to change your medications today. Please follow up with [place] (or new primary care doctor) in 2 months to repeat the blood pressure and add a new medication if required. You will also need to have a blood test for diabetes screening by end of this year. If you had a new primary care, we can release your medical records if have your permission. Please continue taking your medications as before and contact us if you have any question or concern. (I send refills for your medications to your pharmacy) Follow up with [place] or new primary care doctor in 2 months.  Thank you, Dr. Linna Hoff

## 2019-01-20 ENCOUNTER — Encounter: Payer: Self-pay | Admitting: *Deleted

## 2019-01-20 NOTE — Progress Notes (Signed)
Internal Medicine Clinic Attending  Case discussed with Dr. Masoudi at the time of the visit.  We reviewed the resident's history and exam and pertinent patient test results.  I agree with the assessment, diagnosis, and plan of care documented in the resident's note.  Alexander Raines, M.D., Ph.D.  

## 2019-02-18 ENCOUNTER — Encounter: Payer: BLUE CROSS/BLUE SHIELD | Admitting: Internal Medicine

## 2019-02-22 ENCOUNTER — Other Ambulatory Visit: Payer: Self-pay | Admitting: Internal Medicine

## 2019-02-22 DIAGNOSIS — I10 Essential (primary) hypertension: Secondary | ICD-10-CM

## 2019-03-25 ENCOUNTER — Encounter: Payer: BLUE CROSS/BLUE SHIELD | Admitting: Internal Medicine

## 2019-09-23 ENCOUNTER — Ambulatory Visit: Payer: BLUE CROSS/BLUE SHIELD | Attending: Family

## 2019-09-23 DIAGNOSIS — Z23 Encounter for immunization: Secondary | ICD-10-CM

## 2019-09-23 NOTE — Progress Notes (Signed)
   Covid-19 Vaccination Clinic  Name:  Anthony Campos    MRN: [id number] DOB: 03/20/1956  09/23/2019  [name] was observed post Covid-19 immunization for 15 minutes without incident. He was provided with Vaccine Information Sheet and instruction to access the V-Safe system.   [name] was instructed to call 911 with any severe reactions post vaccine: Marland Kitchen Difficulty breathing  . Swelling of face and throat  . A fast heartbeat  . A bad rash all over body  . Dizziness and weakness   Immunizations Administered    Name Date Dose VIS Date Route   Moderna COVID-19 Vaccine 09/23/2019  3:08 PM 0.5 mL 06/08/2019 Intramuscular   Manufacturer: Moderna   Lot: [id number]   [place]:[id number]

## 2019-10-26 ENCOUNTER — Ambulatory Visit: Payer: BLUE CROSS/BLUE SHIELD | Attending: Family

## 2019-10-26 DIAGNOSIS — Z23 Encounter for immunization: Secondary | ICD-10-CM

## 2019-10-26 NOTE — Progress Notes (Signed)
   Covid-19 Vaccination Clinic  Name:  Anthony Campos    MRN: [id number] DOB: Aug 10, 1955  10/26/2019  [name] was observed post Covid-19 immunization for 15 minutes without incident. He was provided with Vaccine Information Sheet and instruction to access the V-Safe system.   [name] was instructed to call 911 with any severe reactions post vaccine: Marland Kitchen Difficulty breathing  . Swelling of face and throat  . A fast heartbeat  . A bad rash all over body  . Dizziness and weakness   Immunizations Administered    Name Date Dose VIS Date Route   Moderna COVID-19 Vaccine 10/26/2019  2:59 PM 0.5 mL 06/2019 Intramuscular   Manufacturer: Moderna   Lot: [id number]   [id number]

## 2019-11-30 DIAGNOSIS — D649 Anemia, unspecified: Secondary | ICD-10-CM | POA: Insufficient documentation

## 2021-01-16 ENCOUNTER — Ambulatory Visit: Payer: Self-pay | Admitting: Podiatry

## 2021-07-24 ENCOUNTER — Encounter: Payer: Self-pay | Admitting: Gastroenterology

## 2022-05-22 ENCOUNTER — Encounter (HOSPITAL_COMMUNITY): Payer: Self-pay | Admitting: Emergency Medicine

## 2022-05-22 ENCOUNTER — Other Ambulatory Visit: Payer: Self-pay

## 2022-05-22 ENCOUNTER — Ambulatory Visit (HOSPITAL_COMMUNITY)
Admission: EM | Admit: 2022-05-22 | Discharge: 2022-05-22 | Disposition: A | Discharge status: Home / Self Care | Payer: Medicare Other

## 2022-05-22 DIAGNOSIS — J069 Acute upper respiratory infection, unspecified: Secondary | ICD-10-CM | POA: Diagnosis not present

## 2022-05-22 MED ORDER — AMOXICILLIN 250 MG/5ML PO SUSR: 500.0000 mg | Freq: Two times a day (BID) | ORAL | 0 refills | Status: AC

## 2022-05-22 MED ORDER — PROMETHAZINE-DM 6.25-15 MG/5ML PO SYRP: 5.0000 mL | ORAL_SOLUTION | Freq: Four times a day (QID) | ORAL | 0 refills | Status: DC | PRN

## 2022-05-22 NOTE — ED Triage Notes (Signed)
Cough, sore throat and congestion for a week.  Patient has had a runny nose.  Patient has seen blood from right nostril.  Sometimes with blowing nose and sometimes, nostril drips blood  Ibuprofen and nyquil.

## 2022-05-22 NOTE — ED Provider Notes (Signed)
[facility]    CSN: [id number] Arrival date & time: 05/22/22  1219      History   Chief Complaint Chief Complaint  Patient presents with   Cough    HPI Anthony Campos is a 66 y.o. male.   Patient presents with chills, nasal congestion, right-sided ear pain, rhinorrhea, nonproductive cough and intermittent wheezing for 7 days.  Endorses that symptoms are worsening as cough is becoming more harsher and he has had inability to move mucus.  No known sick contact.  Decreased appetite, has feeding tube so he is receiving nutrition.  Has attempted use of ibuprofen and NyQuil with minimal relief history.  Currently completing radiation for cancer.    Past Medical History:  Diagnosis Date   Erectile dysfunction    Hyperlipidemia    Hypertension    Infection of skin and subcutaneous tissue    Lipoma    Tobacco abuse    hx    Patient Active Problem List   Diagnosis Date Noted   Conjunctival irritation 01/18/2019   H/O viral gastroenteritis 10/01/2017   Radiculopathy 07/23/2017   Insect bite 12/10/2016   Healthcare maintenance 02/16/2015   TOBACCO ABUSE 12/22/2009   Essential hypertension, benign 10/20/2008   ERECTILE DYSFUNCTION 07/24/2006   Hyperlipidemia, mild 05/20/2006    Past Surgical History:  Procedure Laterality Date   ABDOMINAL EXPOSURE N/A 07/23/2017   Procedure: ABDOMINAL EXPOSURE;  Surgeon: Rosetta Posner, MD;  Location: [facility] OR;  Service: Vascular;  Laterality: N/A;   ANTERIOR LAT LUMBAR FUSION N/A 07/23/2017   Procedure: LUMBAR 3-4 ANTERIOR LATERAL LUMBAR FUSION 1 LEVEL;  Surgeon: Phylliss Bob, MD;  Location: [facility];  Service: Orthopedics;  Laterality: N/A;   ANTERIOR LUMBAR FUSION N/A 07/23/2017   Procedure: LUMBAR 4-5, LUMBAR 5-SACRUM 1, ANTERIOR LUMBAR INTERBODY FUSION WITH INSTRUMENTATION, ALLOGRAFT REQUESTED TIME 6 HRS;  Surgeon: Phylliss Bob, MD;  Location: [facility];  Service: Orthopedics;  Laterality: N/A;  LUMBAR 4-5, LUMBAR 5-SACRUM 1, ANTERIOR  INTERBODY FUSION WITH INSTRUMENTATION AND ALLOGRAFT REQUESTED TIME 6 HRS   COLONOSCOPY  04/12/2016   Dr. Ardis Hughs removed one polyp   CYSTOSCOPY  07/23/2017   Procedure: CYSTOSCOPY FLEXIBLE, [place];  Surgeon: Phylliss Bob, MD;  Location: [facility];  Service: Orthopedics;;   CYSTOSCOPY WITH URETHRAL DILATATION N/A 08/28/2017   Procedure: CYSTOSCOPY WITH URETHRAL DILATATION/BALLOON DILATION, Removal of suprapubic catheter;  Surgeon: Franchot Gallo, MD;  Location: [facility];  Service: Urology;  Laterality: N/A;   HERNIA REPAIR  2005   Right hernia repair    INSERTION OF SUPRAPUBIC CATHETER Right 07/23/2017   Procedure: INSERTION OF SUPRAPUBIC CATHETER;  Surgeon: Phylliss Bob, MD;  Location: [facility];  Service: Orthopedics;  Laterality: Right;       Home Medications    Prior to Admission medications   Medication Sig Start Date End Date Taking? Authorizing Provider  atorvastatin (LIPITOR) 40 MG tablet Take 1 tablet (40 mg total) by mouth daily. Patient not taking: Reported on 05/22/2022 01/18/19   Masoudi, Dorthula Rue, MD  gabapentin (NEURONTIN) 300 MG capsule Take 300 mg by mouth 3 (three) times daily. Patient not taking: Reported on 05/22/2022    [provider]  hydrochlorothiazide (HYDRODIURIL) 25 MG tablet TAKE 1 TABLET(25 MG) BY MOUTH DAILY Patient not taking: Reported on 05/22/2022 01/18/19   Masoudi, Dorthula Rue, MD  ibuprofen (ADVIL,MOTRIN) 800 MG tablet Take 800 mg by mouth every 6 (six) hours as needed.    [provider]  pilocarpine (SALAGEN) 5 MG tablet  Take 5 mg by mouth 3 (three) times daily.    [provider]    Family History Family History  Problem Relation Age of Onset   Depression Sister    Heart disease Neg Hx    Hypertension Neg Hx    Stroke Neg Hx    Colon polyps Neg Hx    Colon cancer Neg Hx    Rectal cancer Neg Hx    Stomach cancer Neg Hx    Esophageal cancer Neg Hx     Social  History Social History   Tobacco Use   Smoking status: Former    Packs/day: 0.25    Types: Cigarettes   Smokeless tobacco: Never   Tobacco comments:    2 gigs per day.  Vaping Use   Vaping Use: Former   Start date: 06/28/2017  Substance Use Topics   Alcohol use: Not Currently    Comment: Beer - rarely.- holidays like thanksgiving and x mas    Drug use: No     Allergies   Patient has no known allergies.   Review of Systems Review of Systems  Constitutional:  Positive for chills. Negative for activity change, appetite change, diaphoresis, fatigue, fever and unexpected weight change.  HENT:  Positive for congestion, ear pain and rhinorrhea. Negative for dental problem, drooling, ear discharge, facial swelling, hearing loss, mouth sores, nosebleeds, postnasal drip, sinus pressure, sinus pain, sneezing, sore throat, tinnitus, trouble swallowing and voice change.   Respiratory:  Positive for cough and wheezing. Negative for apnea, choking, chest tightness, shortness of breath and stridor.   Cardiovascular: Negative.   Gastrointestinal: Negative.   Skin: Negative.      Physical Exam Triage Vital Signs ED Triage Vitals  Enc Vitals Group     BP 05/22/22 1306 105/70     Pulse Rate 05/22/22 1306 63     Resp 05/22/22 1306 18     Temp 05/22/22 1306 98 F (36.7 C)     Temp Source 05/22/22 1306 Oral     SpO2 05/22/22 1306 99 %     Weight --      Height --      Head Circumference --      Peak Flow --      Pain Score 05/22/22 1303 0     Pain Loc --      Pain Edu? --      Excl. in [place]? --    No data found.  Updated Vital Signs BP 105/70 (BP Location: Right Arm)   Pulse 63   Temp 98 F (36.7 C) (Oral)   Resp 18   SpO2 99%   Visual Acuity Right Eye Distance:   Left Eye Distance:   Bilateral Distance:    Right Eye Near:   Left Eye Near:    Bilateral Near:     Physical Exam Constitutional:      Appearance: Normal appearance.  HENT:     Head: Normocephalic.      Right Ear: Tympanic membrane, ear canal and external ear normal.     Left Ear: Tympanic membrane, ear canal and external ear normal.     Nose: Congestion and rhinorrhea present.     Mouth/Throat:     Mouth: Mucous membranes are moist.     Pharynx: Oropharynx is clear.  Eyes:     Extraocular Movements: Extraocular movements intact.  Cardiovascular:     Rate and Rhythm: Normal rate and regular rhythm.     Pulses: Normal pulses.  Heart sounds: Normal heart sounds.  Pulmonary:     Effort: Pulmonary effort is normal.     Breath sounds: Normal breath sounds.  Skin:    General: Skin is warm and dry.  Neurological:     Mental Status: He is alert and oriented to person, place, and time. Mental status is at baseline.  Psychiatric:        Mood and Affect: Mood normal.        Behavior: Behavior normal.      UC Treatments / Results  Labs (all labs ordered are listed, but only abnormal results are displayed) Labs Reviewed - No data to display  EKG   Radiology No results found.  Procedures Procedures (including critical care time)  Medications Ordered in UC Medications - No data to display  Initial Impression / Assessment and Plan / UC Course  I have reviewed the triage vital signs and the nursing notes.  Pertinent labs & imaging results that were available during my care of the patient were reviewed by me and considered in my medical decision making (see chart for details).  Acute upper respiratory infection  Patient is in no signs of distress nor toxic appearing.  Vital signs are stable.  Low suspicion for pneumonia, pneumothorax or bronchitis and therefore will defer imaging.  Have been present for 7 days without signs of relief we will provide coverage for back.,  Prescribed amoxicillin and Promethazine DM as cough is most worrisome symptom today. May use additional over-the-counter medications as needed for supportive care.  May follow-up with urgent care as needed if  symptoms persist or worsen.   Final Clinical Impressions(s) / UC Diagnoses   Final diagnoses:  None   Discharge Instructions   None    ED Prescriptions   None    PDMP not reviewed this encounter.   Hans Eden, NP 05/22/22 1354

## 2022-05-22 NOTE — Discharge Instructions (Signed)
Symptoms most likely started as a virus but as they have persisted for 7 days without signs of resolution we will provide bacterial coverage especially since you are currently receiving radiation we do not want you to become more ill  Begin amoxicillin every morning and every evening for 7 days, ideally you will see improvement 24 to 48 hours systemic progression from there  May use cough syrup every 6 hours as needed for additional comfort, be mindful this medication may make you drowsy    You can take Tylenol and/or Ibuprofen as needed for fever reduction and pain relief.   For cough: honey 1/2 to 1 teaspoon (you can dilute the honey in water or another fluid).  You can also use guaifenesin and dextromethorphan for cough. You can use a humidifier for chest congestion and cough.  If you don't have a humidifier, you can sit in the bathroom with the hot shower running.      For sore throat: try warm salt water gargles, cepacol lozenges, throat spray, warm tea or water with lemon/honey, popsicles or ice, or OTC cold relief medicine for throat discomfort.   For congestion: take a daily anti-histamine like Zyrtec, Claritin, and a oral decongestant, such as pseudoephedrine.  You can also use Flonase 1-2 sprays in each nostril daily.   It is important to stay hydrated: drink plenty of fluids (water, gatorade/powerade/pedialyte, juices, or teas) to keep your throat moisturized and help further relieve irritation/discomfort.

## 2022-07-10 DIAGNOSIS — C109 Malignant neoplasm of oropharynx, unspecified: Secondary | ICD-10-CM | POA: Diagnosis not present

## 2022-07-29 DIAGNOSIS — I700 Atherosclerosis of aorta: Secondary | ICD-10-CM | POA: Diagnosis not present

## 2022-07-29 DIAGNOSIS — I2510 Atherosclerotic heart disease of native coronary artery without angina pectoris: Secondary | ICD-10-CM | POA: Diagnosis not present

## 2022-07-29 DIAGNOSIS — J439 Emphysema, unspecified: Secondary | ICD-10-CM | POA: Diagnosis not present

## 2022-07-29 DIAGNOSIS — Z85818 Personal history of malignant neoplasm of other sites of lip, oral cavity, and pharynx: Secondary | ICD-10-CM | POA: Diagnosis not present

## 2022-07-29 DIAGNOSIS — J432 Centrilobular emphysema: Secondary | ICD-10-CM | POA: Diagnosis not present

## 2022-07-29 DIAGNOSIS — J392 Other diseases of pharynx: Secondary | ICD-10-CM | POA: Diagnosis not present

## 2022-07-29 DIAGNOSIS — C109 Malignant neoplasm of oropharynx, unspecified: Secondary | ICD-10-CM | POA: Diagnosis not present

## 2022-07-29 DIAGNOSIS — Z9889 Other specified postprocedural states: Secondary | ICD-10-CM | POA: Diagnosis not present

## 2022-07-29 DIAGNOSIS — J3489 Other specified disorders of nose and nasal sinuses: Secondary | ICD-10-CM | POA: Diagnosis not present

## 2022-08-20 DIAGNOSIS — C109 Malignant neoplasm of oropharynx, unspecified: Secondary | ICD-10-CM | POA: Diagnosis not present

## 2022-08-20 DIAGNOSIS — R1310 Dysphagia, unspecified: Secondary | ICD-10-CM | POA: Diagnosis not present

## 2022-09-16 DIAGNOSIS — C109 Malignant neoplasm of oropharynx, unspecified: Secondary | ICD-10-CM | POA: Diagnosis not present

## 2022-09-16 DIAGNOSIS — R1310 Dysphagia, unspecified: Secondary | ICD-10-CM | POA: Diagnosis not present

## 2022-10-16 DIAGNOSIS — C109 Malignant neoplasm of oropharynx, unspecified: Secondary | ICD-10-CM | POA: Diagnosis not present

## 2022-10-16 DIAGNOSIS — R1310 Dysphagia, unspecified: Secondary | ICD-10-CM | POA: Diagnosis not present

## 2022-10-28 DIAGNOSIS — C109 Malignant neoplasm of oropharynx, unspecified: Secondary | ICD-10-CM | POA: Diagnosis not present

## 2022-11-14 DIAGNOSIS — C109 Malignant neoplasm of oropharynx, unspecified: Secondary | ICD-10-CM | POA: Diagnosis not present

## 2022-11-14 DIAGNOSIS — R1310 Dysphagia, unspecified: Secondary | ICD-10-CM | POA: Diagnosis not present

## 2022-12-05 DIAGNOSIS — R634 Abnormal weight loss: Secondary | ICD-10-CM | POA: Diagnosis not present

## 2022-12-05 DIAGNOSIS — C109 Malignant neoplasm of oropharynx, unspecified: Secondary | ICD-10-CM | POA: Diagnosis not present

## 2022-12-05 DIAGNOSIS — D649 Anemia, unspecified: Secondary | ICD-10-CM | POA: Diagnosis not present

## 2022-12-11 DIAGNOSIS — R1310 Dysphagia, unspecified: Secondary | ICD-10-CM | POA: Diagnosis not present

## 2022-12-11 DIAGNOSIS — C109 Malignant neoplasm of oropharynx, unspecified: Secondary | ICD-10-CM | POA: Diagnosis not present

## 2023-01-14 DIAGNOSIS — R1310 Dysphagia, unspecified: Secondary | ICD-10-CM | POA: Diagnosis not present

## 2023-01-14 DIAGNOSIS — C109 Malignant neoplasm of oropharynx, unspecified: Secondary | ICD-10-CM | POA: Diagnosis not present

## 2023-02-19 DIAGNOSIS — R1310 Dysphagia, unspecified: Secondary | ICD-10-CM | POA: Diagnosis not present

## 2023-02-19 DIAGNOSIS — C109 Malignant neoplasm of oropharynx, unspecified: Secondary | ICD-10-CM | POA: Diagnosis not present

## 2023-03-11 DIAGNOSIS — C109 Malignant neoplasm of oropharynx, unspecified: Secondary | ICD-10-CM | POA: Diagnosis not present

## 2023-03-19 DIAGNOSIS — R1310 Dysphagia, unspecified: Secondary | ICD-10-CM | POA: Diagnosis not present

## 2023-03-19 DIAGNOSIS — C109 Malignant neoplasm of oropharynx, unspecified: Secondary | ICD-10-CM | POA: Diagnosis not present

## 2023-04-11 DIAGNOSIS — Z431 Encounter for attention to gastrostomy: Secondary | ICD-10-CM | POA: Diagnosis not present

## 2023-04-11 DIAGNOSIS — K9429 Other complications of gastrostomy: Secondary | ICD-10-CM | POA: Diagnosis not present

## 2023-04-11 DIAGNOSIS — C109 Malignant neoplasm of oropharynx, unspecified: Secondary | ICD-10-CM | POA: Diagnosis not present

## 2023-04-15 DIAGNOSIS — R1310 Dysphagia, unspecified: Secondary | ICD-10-CM | POA: Diagnosis not present

## 2023-04-15 DIAGNOSIS — C109 Malignant neoplasm of oropharynx, unspecified: Secondary | ICD-10-CM | POA: Diagnosis not present

## 2023-05-13 DIAGNOSIS — C109 Malignant neoplasm of oropharynx, unspecified: Secondary | ICD-10-CM | POA: Diagnosis not present

## 2023-05-13 DIAGNOSIS — R1310 Dysphagia, unspecified: Secondary | ICD-10-CM | POA: Diagnosis not present

## 2023-05-31 ENCOUNTER — Encounter (HOSPITAL_COMMUNITY): Payer: Self-pay

## 2023-05-31 ENCOUNTER — Ambulatory Visit (HOSPITAL_COMMUNITY)
Admission: EM | Admit: 2023-05-31 | Discharge: 2023-05-31 | Disposition: A | Discharge status: Home / Self Care | Payer: Medicare Other | Attending: Family Medicine | Admitting: Family Medicine

## 2023-05-31 DIAGNOSIS — R040 Epistaxis: Secondary | ICD-10-CM | POA: Diagnosis not present

## 2023-05-31 MED ORDER — OXYMETAZOLINE HCL 0.05 % NA SOLN: 1.0000 | Freq: Two times a day (BID) | NASAL | 0 refills | Status: DC | PRN

## 2023-05-31 NOTE — ED Provider Notes (Signed)
[facility]    CSN: [id number] Arrival date & time: 05/31/23  1741      History   Chief Complaint Chief Complaint  Patient presents with   Epistaxis    HPI Anthony Campos is a 67 y.o. male.   The history is provided by the patient. No language interpreter was used.  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 He has chronic nasal drainage from radiation therapy, hence he blows and cleans his nose often Relieved by:  Applying pressure Associated symptoms: congestion   Associated symptoms: no dizziness and no fever   Associated symptoms comment:  He has been blowing his nose out which might have aggravated this symptoms Risk factors: no sinus problems   Risk factors comment:  Hx of throat cancer s/p radiation therapy   Past Medical History:  Diagnosis Date   Erectile dysfunction    Hyperlipidemia    Hypertension    Infection of skin and subcutaneous tissue    Lipoma    Tobacco abuse    hx    Patient Active Problem List   Diagnosis Date Noted   Conjunctival irritation 01/18/2019   H/O viral gastroenteritis 10/01/2017   Radiculopathy 07/23/2017   Insect bite 12/10/2016   Healthcare maintenance 02/16/2015   TOBACCO ABUSE 12/22/2009   Essential hypertension, benign 10/20/2008   ERECTILE DYSFUNCTION 07/24/2006   Hyperlipidemia, mild 05/20/2006    Past Surgical History:  Procedure Laterality Date   ABDOMINAL EXPOSURE N/A 07/23/2017   Procedure: ABDOMINAL EXPOSURE;  Surgeon: Larina Earthly, MD;  Location: [facility];  Service: Vascular;  Laterality: N/A;   ANTERIOR LAT LUMBAR FUSION N/A 07/23/2017   Procedure: LUMBAR 3-4 ANTERIOR LATERAL LUMBAR FUSION 1 LEVEL;  Surgeon:  Estill Bamberg, MD;  Location: [facility];  Service: Orthopedics;  Laterality: N/A;   ANTERIOR LUMBAR FUSION N/A 07/23/2017   Procedure: LUMBAR 4-5, LUMBAR 5-SACRUM 1, ANTERIOR LUMBAR INTERBODY FUSION WITH INSTRUMENTATION, ALLOGRAFT REQUESTED TIME 6 HRS;  Surgeon: Estill Bamberg, MD;  Location: [facility];  Service: Orthopedics;  Laterality: N/A;  LUMBAR 4-5, LUMBAR 5-SACRUM 1, ANTERIOR INTERBODY FUSION WITH INSTRUMENTATION AND ALLOGRAFT REQUESTED TIME 6 HRS   COLONOSCOPY  04/12/2016   Dr. Christella Hartigan removed one polyp   CYSTOSCOPY  07/23/2017   Procedure: CYSTOSCOPY FLEXIBLE, ATTEMPTED CATHETER INSERTION;  Surgeon: Estill Bamberg, MD;  Location: [facility];  Service: Orthopedics;;   CYSTOSCOPY WITH URETHRAL DILATATION N/A 08/28/2017   Procedure: CYSTOSCOPY WITH URETHRAL DILATATION/BALLOON DILATION, Removal of suprapubic catheter;  Surgeon: Marcine Matar, MD;  Location: [facility];  Service: Urology;  Laterality: N/A;   HERNIA REPAIR  2005   Right hernia repair    INSERTION OF SUPRAPUBIC CATHETER Right 07/23/2017   Procedure: INSERTION OF SUPRAPUBIC CATHETER;  Surgeon: Estill Bamberg, MD;  Location: [facility];  Service: Orthopedics;  Laterality: Right;       Home Medications    Prior to Admission medications   Medication Sig Start Date End Date Taking? Authorizing Provider  oxymetazoline (AFRIN) 0.05 % nasal spray Place 1 spray into both nostrils 2 (two) times daily as needed for congestion. 05/31/23  Yes Doreene Eland, MD  pilocarpine (SALAGEN) 5 MG tablet Take 5 mg by mouth 3 (three) times  daily.    [provider]    Family History Family History  Problem Relation Age of Onset   Depression Sister    Heart disease Neg Hx    Hypertension Neg Hx    Stroke Neg Hx    Colon polyps Neg Hx    Colon cancer Neg Hx    Rectal cancer Neg Hx    Stomach cancer Neg Hx    Esophageal cancer Neg Hx     Social History Social History   Tobacco Use   Smoking status: Former    Current  packs/day: 0.25    Types: Cigarettes   Smokeless tobacco: Never   Tobacco comments:    2 gigs per day.  Vaping Use   Vaping status: Former   Start date: 06/28/2017  Substance Use Topics   Alcohol use: Not Currently    Comment: Beer - rarely.- holidays like thanksgiving and x mas    Drug use: No     Allergies   Patient has no known allergies.   Review of Systems Review of Systems  Constitutional:  Negative for fever.  HENT:  Positive for congestion and nosebleeds.   Neurological:  Negative for dizziness.     Physical Exam Triage Vital Signs ED Triage Vitals [05/31/23 1823]  Encounter Vitals Group     BP 104/62     Systolic BP Percentile      Diastolic BP Percentile      Pulse Rate 82     Resp 16     Temp 98.1 F (36.7 C)     Temp Source Oral     SpO2 97 %     Weight 175 lb (79.4 kg)     Height 6\' 2"  (1.88 m)     Head Circumference      Peak Flow      Pain Score 0     Pain Loc      Pain Education      Exclude from Growth Chart    No data found.  Updated Vital Signs BP 104/62 (BP Location: Left Arm)   Pulse 82   Temp 98.1 F (36.7 C) (Oral)   Resp 16   Ht 6\' 2"  (1.88 m)   Wt 79.4 kg   SpO2 97%   BMI 22.47 kg/m   Visual Acuity Right Eye Distance:   Left Eye Distance:   Bilateral Distance:    Right Eye Near:   Left Eye Near:    Bilateral Near:     Physical Exam Vitals and nursing note reviewed.  HENT:     Nose: No congestion or rhinorrhea.     Comments: Left nostril with scanty dry blood. Right nostril clean with very scanty dry blood. No active bleeding Cardiovascular:     Rate and Rhythm: Normal rate and regular rhythm.     Heart sounds: Normal heart sounds. No murmur heard. Pulmonary:     Effort: Pulmonary effort is normal. No respiratory distress.     Breath sounds: Normal breath sounds. No wheezing.      UC Treatments / Results  Labs (all labs ordered are listed, but only abnormal results are displayed) Labs Reviewed - No data  to display  EKG   Radiology No results found.  Procedures Procedures (including critical care time)  Medications Ordered in UC Medications - No data to display  Initial Impression / Assessment and Plan / UC Course  I have reviewed the triage vital signs and the nursing notes.  Pertinent labs & imaging results that were available during my care of the patient were reviewed by me and considered in my medical decision making (see chart for details).  Clinical Course as of 05/31/23 1850  Sat May 31, 2023  1849 Epistaxis Likely in the settings of nose blowing and recent dry weather May apply pressure and vaselin as needed Afrin escribed prn nose bleed ED precautions discussed Him and his daughter were appreciative of the recommended provided [KE]    Clinical Course User Index [KE] Doreene Eland, MD     Final Clinical Impressions(s) / UC Diagnoses   Final diagnoses:  Epistaxis     Discharge Instructions      It was nice seeing you today. I am sorry about your nose bleed. This cold be from excessive nose blowing and cleaning.  You can apply Vaseline to your nostril. I also send in Afrin for your bleeding. If your symptoms persists, please go to the ED.     ED Prescriptions     Medication Sig Dispense Auth. Provider   oxymetazoline (AFRIN) 0.05 % nasal spray Place 1 spray into both nostrils 2 (two) times daily as needed for congestion. 15 mL Doreene Eland, MD      PDMP not reviewed this encounter.   Doreene Eland, MD 05/31/23 [phone]

## 2023-05-31 NOTE — Discharge Instructions (Signed)
It was nice seeing you today. I am sorry about your nose bleed. This cold be from excessive nose blowing and cleaning.  You can apply Vaseline to your nostril. I also send in Afrin for your bleeding. If your symptoms persists, please go to the ED.

## 2023-05-31 NOTE — ED Triage Notes (Signed)
Patient here today with c/o right side nose bleed X 2 days. Patient has been on chemotherapy 2 years ago for throat cancer.

## 2023-06-10 DIAGNOSIS — R1310 Dysphagia, unspecified: Secondary | ICD-10-CM | POA: Diagnosis not present

## 2023-06-10 DIAGNOSIS — C109 Malignant neoplasm of oropharynx, unspecified: Secondary | ICD-10-CM | POA: Diagnosis not present

## 2023-06-12 ENCOUNTER — Emergency Department (HOSPITAL_COMMUNITY)
Admission: EM | Admit: 2023-06-12 | Discharge: 2023-06-12 | Disposition: A | Discharge status: Home / Self Care | Payer: Medicare Other | Attending: Emergency Medicine | Admitting: Emergency Medicine

## 2023-06-12 ENCOUNTER — Encounter (HOSPITAL_COMMUNITY): Payer: Self-pay

## 2023-06-12 DIAGNOSIS — Z85818 Personal history of malignant neoplasm of other sites of lip, oral cavity, and pharynx: Secondary | ICD-10-CM | POA: Insufficient documentation

## 2023-06-12 DIAGNOSIS — R040 Epistaxis: Secondary | ICD-10-CM | POA: Insufficient documentation

## 2023-06-12 NOTE — Discharge Instructions (Signed)
As we discussed, I would like for you to not blow your nose excessively for the next week.  Please stop using the Afrin.  You can use the Vaseline as needed.  If you experience a nosebleed, I would like for you to apply direct pressure for 15 minutes.  If you are still having the nosebleed extensively or if it is bleeding after 15 minutes she can proceed to come to the emergency room.

## 2023-06-12 NOTE — ED Triage Notes (Signed)
Pt is coming in for a nosebleed that has been going on for 3 weeks it has been intermittent and he has been following the outpatient directions for using affirin with Vaseline but that treatment is no longer working at this time. Pt is otherwise stable with no other complaints at this moment.

## 2023-06-12 NOTE — ED Provider Notes (Signed)
[place] EM[facility] AT [facility] Provider Note   CSN: [id number] Arrival date & time: 06/12/23  1342     History Chief Complaint  Patient presents with   Epistaxis    Anthony Campos is a 67 y.o. male patient who presents to the emergency department today for further evaluation of epistaxis.  This been going on intermittently for the last 3 weeks.  Patient has been on Afrin for the last 2 weeks using it every single day.  He also been applying Vaseline to the nose.  Patient does state that he blows his nose excessively.  Of note, patient is currently undergoing radiation for oropharyngeal cancer.  Patient denies fever or chills.   Epistaxis      Home Medications Prior to Admission medications   Medication Sig Start Date End Date Taking? Authorizing Provider  oxymetazoline (AFRIN) 0.05 % nasal spray Place 1 spray into both nostrils 2 (two) times daily as needed for congestion. 05/31/23   Doreene Eland, MD  pilocarpine (SALAGEN) 5 MG tablet Take 5 mg by mouth 3 (three) times daily.    [provider]      Allergies    Patient has no known allergies.    Review of Systems   Review of Systems  HENT:  Positive for nosebleeds.   All other systems reviewed and are negative.   Physical Exam Updated Vital Signs BP 107/85 (BP Location: Right Arm)   Pulse 81   Temp 98.2 F (36.8 C)   Resp 18   SpO2 100%  Physical Exam Vitals and nursing note reviewed.  Constitutional:      Appearance: Normal appearance.  HENT:     Head: Normocephalic and atraumatic.     Nose:     Comments: No evidence of active bleeding in the right nare.  There is mild swelling of the right turbinate. Eyes:     General:        Right eye: No discharge.        Left eye: No discharge.     Conjunctiva/sclera: Conjunctivae normal.  Pulmonary:     Effort: Pulmonary effort is normal.  Skin:    General: Skin is warm and dry.     Findings: No rash.  Neurological:      General: No focal deficit present.     Mental Status: He is alert.  Psychiatric:        Mood and Affect: Mood normal.        Behavior: Behavior normal.     ED Results / Procedures / Treatments   Labs (all labs ordered are listed, but only abnormal results are displayed) Labs Reviewed - No data to display  EKG None  Radiology No results found.  Procedures Procedures    Medications Ordered in ED Medications - No data to display  ED Course/ Medical Decision Making/ A&P   {   Click here for ABCD2, HEART and other calculators  Medical Decision Making Treysean Soine is a 67 y.o. male patient who presents to the emergency department today for further evaluation of epistaxis.  I do not see any evidence of any active bleeding.  I educated the patient on appropriate use of Afrin and excessive nasal blowing.  He will stop blowing his nose for the next week and stop using Afrin.  He will use Vaseline as well.  He was encouraged to return to the emergency room for any worsening symptoms strict turn precautions were discussed.  He is  safe for discharge.     Final Clinical Impression(s) / ED Diagnoses Final diagnoses:  Epistaxis    Rx / DC Orders ED Discharge Orders     None         Teressa Lower, [place] 06/12/23 1503    Rondel Baton, MD 06/14/23 [phone]

## 2023-07-07 DIAGNOSIS — C109 Malignant neoplasm of oropharynx, unspecified: Secondary | ICD-10-CM | POA: Diagnosis not present

## 2023-07-09 DIAGNOSIS — R1310 Dysphagia, unspecified: Secondary | ICD-10-CM | POA: Diagnosis not present

## 2023-07-09 DIAGNOSIS — C109 Malignant neoplasm of oropharynx, unspecified: Secondary | ICD-10-CM | POA: Diagnosis not present

## 2023-07-16 DIAGNOSIS — R1310 Dysphagia, unspecified: Secondary | ICD-10-CM | POA: Diagnosis not present

## 2023-07-16 DIAGNOSIS — C109 Malignant neoplasm of oropharynx, unspecified: Secondary | ICD-10-CM | POA: Diagnosis not present

## 2023-07-21 DIAGNOSIS — C109 Malignant neoplasm of oropharynx, unspecified: Secondary | ICD-10-CM | POA: Diagnosis not present

## 2023-07-21 DIAGNOSIS — K9429 Other complications of gastrostomy: Secondary | ICD-10-CM | POA: Diagnosis not present

## 2023-07-21 DIAGNOSIS — Z431 Encounter for attention to gastrostomy: Secondary | ICD-10-CM | POA: Diagnosis not present

## 2023-07-21 DIAGNOSIS — Z981 Arthrodesis status: Secondary | ICD-10-CM | POA: Diagnosis not present

## 2023-08-19 DIAGNOSIS — R1310 Dysphagia, unspecified: Secondary | ICD-10-CM | POA: Diagnosis not present

## 2023-08-19 DIAGNOSIS — C109 Malignant neoplasm of oropharynx, unspecified: Secondary | ICD-10-CM | POA: Diagnosis not present

## 2023-09-07 ENCOUNTER — Emergency Department (HOSPITAL_COMMUNITY)

## 2023-09-07 ENCOUNTER — Encounter (HOSPITAL_COMMUNITY): Payer: Self-pay

## 2023-09-07 ENCOUNTER — Other Ambulatory Visit: Payer: Self-pay

## 2023-09-07 ENCOUNTER — Observation Stay (HOSPITAL_COMMUNITY)
Admission: EM | Admit: 2023-09-07 | Discharge: 2023-09-09 | Disposition: A | Discharge status: Home / Self Care | Attending: Internal Medicine | Admitting: Internal Medicine

## 2023-09-07 DIAGNOSIS — Z85818 Personal history of malignant neoplasm of other sites of lip, oral cavity, and pharynx: Secondary | ICD-10-CM | POA: Insufficient documentation

## 2023-09-07 DIAGNOSIS — J392 Other diseases of pharynx: Secondary | ICD-10-CM

## 2023-09-07 DIAGNOSIS — Z79899 Other long term (current) drug therapy: Secondary | ICD-10-CM | POA: Insufficient documentation

## 2023-09-07 DIAGNOSIS — I10 Essential (primary) hypertension: Secondary | ICD-10-CM | POA: Insufficient documentation

## 2023-09-07 DIAGNOSIS — I700 Atherosclerosis of aorta: Secondary | ICD-10-CM | POA: Diagnosis not present

## 2023-09-07 DIAGNOSIS — Z87891 Personal history of nicotine dependence: Secondary | ICD-10-CM | POA: Insufficient documentation

## 2023-09-07 DIAGNOSIS — M47812 Spondylosis without myelopathy or radiculopathy, cervical region: Secondary | ICD-10-CM | POA: Diagnosis not present

## 2023-09-07 DIAGNOSIS — J320 Chronic maxillary sinusitis: Secondary | ICD-10-CM | POA: Diagnosis not present

## 2023-09-07 DIAGNOSIS — K920 Hematemesis: Secondary | ICD-10-CM | POA: Diagnosis not present

## 2023-09-07 DIAGNOSIS — R041 Hemorrhage from throat: Secondary | ICD-10-CM | POA: Diagnosis not present

## 2023-09-07 LAB — RETICULOCYTES
Immature Retic Fract: 9.4 % (ref 2.3–15.9)
RBC.: 3.08 MIL/uL — ABNORMAL LOW (ref 4.22–5.81)
Retic Count, Absolute: 33.3 10*3/uL (ref 19.0–186.0)
Retic Ct Pct: 1.1 % (ref 0.4–3.1)

## 2023-09-07 LAB — COMPREHENSIVE METABOLIC PANEL
ALT: 20 U/L (ref 0–44)
AST: 32 U/L (ref 15–41)
Albumin: 3.6 g/dL (ref 3.5–5.0)
Alkaline Phosphatase: 60 U/L (ref 38–126)
Anion gap: 11 (ref 5–15)
BUN: 30 mg/dL — ABNORMAL HIGH (ref 8–23)
CO2: 25 mmol/L (ref 22–32)
Calcium: 9 mg/dL (ref 8.9–10.3)
Chloride: 99 mmol/L (ref 98–111)
Creatinine, Ser: 0.9 mg/dL (ref 0.61–1.24)
GFR, Estimated: >60 mL/min (ref >60)
Glucose, Bld: 90 mg/dL (ref 70–99)
Potassium: 4 mmol/L (ref 3.5–5.1)
Sodium: 135 mmol/L (ref 135–145)
Total Bilirubin: 0.5 mg/dL (ref 0.0–1.2)
Total Protein: 8 g/dL (ref 6.5–8.1)

## 2023-09-07 LAB — APTT: aPTT: 30 s (ref 24–36)

## 2023-09-07 LAB — PROTIME-INR
INR: 1.1 (ref 0.8–1.2)
Prothrombin Time: 14.4 s (ref 11.4–15.2)

## 2023-09-07 LAB — CBC WITH DIFFERENTIAL/PLATELET
Abs Immature Granulocytes: 0.01 10*3/uL (ref 0.00–0.07)
Basophils Absolute: 0 10*3/uL (ref 0.0–0.1)
Basophils Relative: 1 %
Eosinophils Absolute: 0.2 10*3/uL (ref 0.0–0.5)
Eosinophils Relative: 4 %
HCT: 34.4 % — ABNORMAL LOW (ref 39.0–52.0)
Hemoglobin: 11.2 g/dL — ABNORMAL LOW (ref 13.0–17.0)
Immature Granulocytes: 0 %
Lymphocytes Relative: 21 %
Lymphs Abs: 0.9 10*3/uL (ref 0.7–4.0)
MCH: 31.9 pg (ref 26.0–34.0)
MCHC: 32.6 g/dL (ref 30.0–36.0)
MCV: 98 fL (ref 80.0–100.0)
Monocytes Absolute: 1 10*3/uL (ref 0.1–1.0)
Monocytes Relative: 23 %
Neutro Abs: 2.3 10*3/uL (ref 1.7–7.7)
Neutrophils Relative %: 51 %
Platelets: 157 10*3/uL (ref 150–400)
RBC: 3.51 MIL/uL — ABNORMAL LOW (ref 4.22–5.81)
RDW: 14 % (ref 11.5–15.5)
WBC: 4.5 10*3/uL (ref 4.0–10.5)
nRBC: 0 % (ref 0.0–0.2)

## 2023-09-07 LAB — CBC
HCT: 30.4 % — ABNORMAL LOW (ref 39.0–52.0)
Hemoglobin: 9.9 g/dL — ABNORMAL LOW (ref 13.0–17.0)
MCH: 32.2 pg (ref 26.0–34.0)
MCHC: 32.6 g/dL (ref 30.0–36.0)
MCV: 99 fL (ref 80.0–100.0)
Platelets: 135 10*3/uL — ABNORMAL LOW (ref 150–400)
RBC: 3.07 MIL/uL — ABNORMAL LOW (ref 4.22–5.81)
RDW: 14 % (ref 11.5–15.5)
WBC: 4.9 10*3/uL (ref 4.0–10.5)
nRBC: 0 % (ref 0.0–0.2)

## 2023-09-07 LAB — I-STAT CHEM 8, ED
BUN: 36 mg/dL — ABNORMAL HIGH (ref 8–23)
Calcium, Ion: 1.09 mmol/L — ABNORMAL LOW (ref 1.15–1.40)
Chloride: 105 mmol/L (ref 98–111)
Creatinine, Ser: 1.1 mg/dL (ref 0.61–1.24)
Glucose, Bld: 87 mg/dL (ref 70–99)
HCT: 35 % — ABNORMAL LOW (ref 39.0–52.0)
Hemoglobin: 11.9 g/dL — ABNORMAL LOW (ref 13.0–17.0)
Potassium: 4.2 mmol/L (ref 3.5–5.1)
Sodium: 139 mmol/L (ref 135–145)
TCO2: 29 mmol/L (ref 22–32)

## 2023-09-07 LAB — TYPE AND SCREEN
ABO/RH(D): O POS
Antibody Screen: NEGATIVE

## 2023-09-07 LAB — LIPASE, BLOOD: Lipase: 29 U/L (ref 11–51)

## 2023-09-07 MED ORDER — ONDANSETRON HCL 4 MG/2ML IJ SOLN
4.0000 mg | Freq: Once | INTRAMUSCULAR | Status: AC
  Administered 2023-09-07: 4 mg via INTRAVENOUS
  Filled 2023-09-07: qty 2

## 2023-09-07 MED ORDER — SODIUM CHLORIDE 0.9 % IV SOLN: INTRAVENOUS | Status: DC

## 2023-09-07 MED ORDER — TRANEXAMIC ACID FOR INHALATION
500.0000 mg | Freq: Once | RESPIRATORY_TRACT | Status: AC
  Administered 2023-09-07: 500 mg via RESPIRATORY_TRACT
  Filled 2023-09-07 (×2): qty 5

## 2023-09-07 MED ORDER — PANTOPRAZOLE SODIUM 40 MG IV SOLR
80.0000 mg | Freq: Once | INTRAVENOUS | Status: AC
  Administered 2023-09-07: 80 mg via INTRAVENOUS
  Filled 2023-09-07: qty 20

## 2023-09-07 MED ORDER — IOHEXOL 350 MG/ML SOLN
75.0000 mL | Freq: Once | INTRAVENOUS | Status: AC | PRN
  Administered 2023-09-07: 75 mL via INTRAVENOUS

## 2023-09-07 MED ORDER — SODIUM CHLORIDE 0.9 % IV BOLUS
1000.0000 mL | Freq: Once | INTRAVENOUS | Status: AC
  Administered 2023-09-07: 1000 mL via INTRAVENOUS

## 2023-09-07 NOTE — Subjective & Objective (Signed)
 Presents with hematemesis since 1:30 PM history of throat cancer status post chemo and radiation not on any blood thinners

## 2023-09-07 NOTE — Assessment & Plan Note (Signed)
 Allow permissive HTN

## 2023-09-07 NOTE — H&P (Signed)
 Creighton Longley NFA:[id number] DOB: Nov 15, 1955 DOA: 09/07/2023     PCP: Default, Provider, MD   Outpatient Specialists: ENT   Patient arrived to ER on 09/07/23 at 1651 Referred by Attending Rondel Baton, MD   Patient coming from:    home Lives   With family    Chief Complaint:   Chief Complaint  Patient presents with   Hematemesis    HPI: Anthony Campos is a 68 y.o. male with medical history significant of oropharyngeal cancer, HTN hLD    Presented with  oral/nasal bleeding Presents with hematemesis since 1:30 PM history of throat cancer status post chemo and radiation not on any blood thinners    Per family hx of nose bleeding in the past responded to Afrin He has g tube in place Takes meds per G tube only but sometimes eats mashed potatoes  Denies vomiting of blood reports blood started to count from the back of his throat and nose Blood clots Family states that he has produced at least 2 cups of blood  Denies significant ETOH intake      No results found for: "SARSCOV2NAA"      Regarding pertinent Chronic problems:     Hyperlipidemia -  not on statins   Lipid Panel     Component Value Date/Time   CHOL 134 12/10/2016 1659   TRIG 83 12/10/2016 1659   HDL 45 12/10/2016 1659   CHOLHDL 3.0 12/10/2016 1659   CHOLHDL 4.6 07/22/2014 1036   VLDL 25 07/22/2014 1036   LDLCALC 72 12/10/2016 1659   LABVLDL 17 12/10/2016 1659     HTN on no longer on bp meds          Chronic anemia - baseline hg Hemoglobin & Hematocrit  Recent Labs    09/07/23 1710 09/07/23 1719  HGB 11.2* 11.9*    Cancer:right throat cancer which was treated with radiation two years ago     While in ER: Clinical Course as of 09/07/23 2218  Wynelle Link Sep 07, 2023  2141 Dr Sharol Harness from ENT will see the patient in the morning.  Agrees with current management. [RP]    Clinical Course User Index [RP] Rondel Baton, MD     Lab Orders         Comprehensive metabolic panel         CBC  with Differential         Protime-INR         Lipase, blood         APTT         I-Stat Chem 8, ED      CT neck     Mild asymmetric mucosal thickening about the posterior left oropharynx, nonspecific. Correlation with direct visualization to ensure no underlying mucosal based lesion is present at this location. Sequelae of pharyngitis would be the primary differential consideration.    No other acute abnormality within the neck.  CXR -  NON acute    Following Medications were ordered in ER: Medications  sodium chloride 0.9 % bolus 1,000 mL (0 mLs Intravenous Stopped 09/07/23 1909)    And  0.9 %  sodium chloride infusion ( Intravenous New Bag/Given 09/07/23 1935)  pantoprazole (PROTONIX) injection 80 mg (80 mg Intravenous Given 09/07/23 1737)  tranexamic acid (CYKLOKAPRON) 1000 MG/10ML nebulizer solution 500 mg (500 mg Nebulization Given 09/07/23 1913)  ondansetron (ZOFRAN) injection 4 mg (4 mg Intravenous Given 09/07/23 1737)  iohexol (OMNIPAQUE) 350 MG/ML injection 75 mL (75  mLs Intravenous Contrast Given 09/07/23 2015)    _______________________________________________________ ER Provider Called:      ENT Dr.Vandergriend They Recommend admit to medicine  Will see in AM     ED Triage Vitals  Encounter Vitals Group     BP 09/07/23 1656 95/80     Systolic BP Percentile --      Diastolic BP Percentile --      Pulse Rate 09/07/23 1656 (!) 109     Resp 09/07/23 1656 (!) 21     Temp 09/07/23 1656 98.1 F (36.7 C)     Temp Source 09/07/23 2110 Temporal     SpO2 09/07/23 1656 99 %     Weight 09/07/23 1656 172 lb (78 kg)     Height 09/07/23 1656 6\' 2"  (1.88 m)     Head Circumference --      Peak Flow --      Pain Score 09/07/23 1656 0     Pain Loc --      Pain Education --      Exclude from Growth Chart --   AOZH(08)@     _________________________________________ Significant initial  Findings: Abnormal Labs Reviewed  COMPREHENSIVE METABOLIC PANEL - Abnormal; Notable for the  following components:      Result Value   BUN 30 (*)    All other components within normal limits  CBC WITH DIFFERENTIAL/PLATELET - Abnormal; Notable for the following components:   RBC 3.51 (*)    Hemoglobin 11.2 (*)    HCT 34.4 (*)    All other components within normal limits  I-STAT CHEM 8, ED - Abnormal; Notable for the following components:   BUN 36 (*)    Calcium, Ion 1.09 (*)    Hemoglobin 11.9 (*)    HCT 35.0 (*)    All other components within normal limits    The recent clinical data is shown below. Vitals:   09/07/23 2030 09/07/23 2110 09/07/23 2115 09/07/23 2200  BP: 111/70  109/72 101/67  Pulse: 82  78 73  Resp: 20  15 14   Temp:  (!) 97.1 F (36.2 C)    TempSrc:  Temporal    SpO2: 100%  100% 100%  Weight:      Height:        WBC     Component Value Date/Time   WBC 4.5 09/07/2023 1710   LYMPHSABS 0.9 09/07/2023 1710   MONOABS 1.0 09/07/2023 1710   EOSABS 0.2 09/07/2023 1710   BASOSABS 0.0 09/07/2023 1710         UA not ordered    Results for orders placed or performed during the hospital encounter of 07/21/17  Surgical pcr screen     Status: None   Collection Time: 07/21/17  9:54 AM   Specimen: Nasal Mucosa; Nasal Swab  Result Value Ref Range Status   MRSA, PCR NEGATIVE NEGATIVE Final   Staphylococcus aureus NEGATIVE NEGATIVE Final    Comment: (NOTE) The Xpert SA Assay (FDA approved for NASAL specimens in patients 63 years of age and older), is one component of a comprehensive surveillance program. It is not intended to diagnose infection nor to guide or monitor treatment.        __________________________________________________________ Recent Labs  Lab 09/07/23 1710 09/07/23 1719  NA 135 139  K 4.0 4.2  CO2 25  --   GLUCOSE 90 87  BUN 30* 36*  CREATININE 0.90 1.10  CALCIUM 9.0  --     Cr  stable,  Lab Results  Component Value Date   CREATININE 1.10 09/07/2023   CREATININE 0.90 09/07/2023   CREATININE 0.97 07/16/2018     Recent Labs  Lab 09/07/23 1710  AST 32  ALT 20  ALKPHOS 60  BILITOT 0.5  PROT 8.0  ALBUMIN 3.6   Lab Results  Component Value Date   CALCIUM 9.0 09/07/2023       Plt: Lab Results  Component Value Date   PLT 157 09/07/2023    Recent Labs  Lab 09/07/23 1710 09/07/23 1719  WBC 4.5  --   NEUTROABS 2.3  --   HGB 11.2* 11.9*  HCT 34.4* 35.0*  MCV 98.0  --   PLT 157  --     HG/HCT  stable,       Component Value Date/Time   HGB 11.9 (L) 09/07/2023 1719   HCT 35.0 (L) 09/07/2023 1719   MCV 98.0 09/07/2023 1710      Recent Labs  Lab 09/07/23 1710  LIPASE 29     _______________________________________________ Hospitalist was called for admission for  oropharyngeal bleed   The following Work up has been ordered so far:  Orders Placed This Encounter  Procedures   DG Chest 2 View   CT Soft Tissue Neck W Contrast   Comprehensive metabolic panel   CBC with Differential   Protime-INR   Lipase, blood   APTT   Diet NPO time specified   Place X2 Large Bore IV's   Initiate Carrier Fluid Protocol   ED Cardiac monitoring   Consult to ear nose and throat  hematemesis   Consult for Unassigned Medical Admission  Hematemesis - ENT following   I-Stat Chem 8, ED   Type and screen [facility]     OTHER Significant initial  Findings:  labs showing:      Radiological Exams on Admission: CT Soft Tissue Neck W Contrast Result Date: 09/07/2023 CLINICAL DATA:  Initial evaluation for soft tissue infection suspected. EXAM: CT NECK WITH CONTRAST TECHNIQUE: Multidetector CT imaging of the neck was performed using the standard protocol following the bolus administration of intravenous contrast. RADIATION DOSE REDUCTION: This exam was performed according to the departmental dose-optimization program which includes automated exposure control, adjustment of the mA and/or kV according to patient size and/or use of iterative reconstruction technique. CONTRAST:   75mL OMNIPAQUE IOHEXOL 350 MG/ML SOLN COMPARISON:  Prior CT from 07/29/2022 FINDINGS: Pharynx and larynx: Oral cavity within normal limits. There is mild asymmetric mucosal thickening about the posterior left oropharynx (series 3, image 59). No visible discrete mass. No significant mucosal edema. No retropharyngeal collection. Negative epiglottis. Remainder of the hypopharynx and supraglottic larynx within normal limits. Negative glottis. Subglottic airway clear. Salivary glands: Asymmetric atrophy noted involving the right parotid gland. Parotid and submandibular glands otherwise unremarkable. Thyroid: Normal. Lymph nodes: No enlarged or pathologic adenopathy within the neck. Vascular: Normal intravascular enhancement seen within the neck. Aortic atherosclerosis noted. Limited intracranial: Unremarkable. Visualized orbits: Unremarkable. Mastoids and visualized paranasal sinuses: Chronic ethmoidal and maxillary sinusitis, right worse than left. Mastoid air cells and middle ear cavities are clear. Skeleton: No worrisome osseous lesions. Mild-to-moderate spondylosis at C4-5 through C6-7. Patient is largely edentulous. Upper chest: Emphysema.  No other acute finding. Other: None. IMPRESSION: 1. Mild asymmetric mucosal thickening about the posterior left oropharynx, nonspecific. Correlation with direct visualization to ensure no underlying mucosal based lesion is present at this location. Sequelae of pharyngitis would be the primary differential consideration. 2. No other  acute abnormality within the neck.  No adenopathy. 3. Chronic ethmoidal and maxillary sinusitis, right worse than left. Aortic Atherosclerosis (ICD10-I70.0) and Emphysema (ICD10-J43.9). Electronically Signed   By: Rise Mu M.D.   On: 09/07/2023 21:06   DG Chest 2 View Result Date: 09/07/2023 CLINICAL DATA:  Hematemesis EXAM: CHEST - 2 VIEW COMPARISON:  07/23/2017 FINDINGS: The heart size and mediastinal contours are within normal limits.  Both lungs are clear. The visualized skeletal structures are unremarkable. IMPRESSION: No active cardiopulmonary disease. Electronically Signed   By: Sharlet Salina M.D.   On: 09/07/2023 17:32   _______________________________________________________________________________________________________ Latest  Blood pressure 101/67, pulse 73, temperature (!) 97.1 F (36.2 C), temperature source Temporal, resp. rate 14, height 6\' 2"  (1.88 m), weight 78 kg, SpO2 100%.   Vitals  labs and radiology finding personally reviewed  Review of Systems:    Pertinent positives include:  bleeding from nose and throat now resolved  Constitutional:  No weight loss, night sweats, Fevers, chills, fatigue, weight loss  HEENT:  No headaches, Difficulty swallowing,Tooth/dental problems,Sore throat,  No sneezing, itching, ear ache, nasal congestion, post nasal drip,  Cardio-vascular:  No chest pain, Orthopnea, PND, anasarca, dizziness, palpitations.no Bilateral lower extremity swelling  GI:  No heartburn, indigestion, abdominal pain, nausea, vomiting, diarrhea, change in bowel habits, loss of appetite, melena, blood in stool, hematemesis Resp:  no shortness of breath at rest. No dyspnea on exertion, No excess mucus, no productive cough, No non-productive cough, No coughing up of blood.No change in color of mucus.No wheezing. Skin:  no rash or lesions. No jaundice GU:  no dysuria, change in color of urine, no urgency or frequency. No straining to urinate.  No flank pain.  Musculoskeletal:  No joint pain or no joint swelling. No decreased range of motion. No back pain.  Psych:  No change in mood or affect. No depression or anxiety. No memory loss.  Neuro: no localizing neurological complaints, no tingling, no weakness, no double vision, no gait abnormality, no slurred speech, no confusion  All systems reviewed and apart from HOPI all are  negative _______________________________________________________________________________________________ Past Medical History:   Past Medical History:  Diagnosis Date   Erectile dysfunction    Hyperlipidemia    Hypertension    Infection of skin and subcutaneous tissue    Lipoma    Tobacco abuse    hx      Past Surgical History:  Procedure Laterality Date   ABDOMINAL EXPOSURE N/A 07/23/2017   Procedure: ABDOMINAL EXPOSURE;  Surgeon: Larina Earthly, MD;  Location: [facility] OR;  Service: Vascular;  Laterality: N/A;   ANTERIOR LAT LUMBAR FUSION N/A 07/23/2017   Procedure: LUMBAR 3-4 ANTERIOR LATERAL LUMBAR FUSION 1 LEVEL;  Surgeon: Estill Bamberg, MD;  Location: [facility] OR;  Service: Orthopedics;  Laterality: N/A;   ANTERIOR LUMBAR FUSION N/A 07/23/2017   Procedure: LUMBAR 4-5, LUMBAR 5-SACRUM 1, ANTERIOR LUMBAR INTERBODY FUSION WITH INSTRUMENTATION, ALLOGRAFT REQUESTED TIME 6 HRS;  Surgeon: Estill Bamberg, MD;  Location: [facility] OR;  Service: Orthopedics;  Laterality: N/A;  LUMBAR 4-5, LUMBAR 5-SACRUM 1, ANTERIOR INTERBODY FUSION WITH INSTRUMENTATION AND ALLOGRAFT REQUESTED TIME 6 HRS   COLONOSCOPY  04/12/2016   Dr. Christella Hartigan removed one polyp   CYSTOSCOPY  07/23/2017   Procedure: CYSTOSCOPY FLEXIBLE, ATTEMPTED CATHETER INSERTION;  Surgeon: Estill Bamberg, MD;  Location: [facility] OR;  Service: Orthopedics;;   CYSTOSCOPY WITH URETHRAL DILATATION N/A 08/28/2017   Procedure: CYSTOSCOPY WITH URETHRAL DILATATION/BALLOON DILATION, Removal of suprapubic catheter;  Surgeon: Marcine Matar, MD;  Location:  [facility];  Service: Urology;  Laterality: N/A;   HERNIA REPAIR  2005   Right hernia repair    INSERTION OF SUPRAPUBIC CATHETER Right 07/23/2017   Procedure: INSERTION OF SUPRAPUBIC CATHETER;  Surgeon: Estill Bamberg, MD;  Location: [facility] OR;  Service: Orthopedics;  Laterality: Right;    Social History:  Ambulatory  cane,      reports that he has quit smoking. His smoking use included cigarettes. He  has never used smokeless tobacco. He reports that he does not currently use alcohol. He reports that he does not use drugs.   Family History:    Family History  Problem Relation Age of Onset   Depression Sister    Heart disease Neg Hx    Hypertension Neg Hx    Stroke Neg Hx    Colon polyps Neg Hx    Colon cancer Neg Hx    Rectal cancer Neg Hx    Stomach cancer Neg Hx    Esophageal cancer Neg Hx    ______________________________________________________________________________________________ Allergies: No Known Allergies   Prior to Admission medications   Medication Sig Start Date End Date Taking? Authorizing Provider  oxymetazoline (AFRIN) 0.05 % nasal spray Place 1 spray into both nostrils 2 (two) times daily as needed for congestion. 05/31/23   Doreene Eland, MD  pilocarpine (SALAGEN) 5 MG tablet Take 5 mg by mouth 3 (three) times daily.    [provider]    ___________________________________________________________________________________________________ Physical Exam:    09/07/2023   10:00 PM 09/07/2023    9:15 PM 09/07/2023    8:30 PM  Vitals with BMI  Systolic 101 109 284  Diastolic 67 72 70  Pulse 73 78 82     1. General:  in No  Acute distress   Chronically ill-appearing 2. Psychological: Alert and   Oriented 3. Head/ENT:   Dry Mucous Membranes                          Head Non traumatic, neck supple                    Poor Dentition 4. SKIN:  decreased Skin turgor,  Skin clean Dry and intact no rash    5. Heart: Regular rate and rhythm no  Murmur, no Rub or gallop 6. Lungs:  no wheezes or crackles   7. Abdomen: Soft,  non-tender, Non distended bowel sounds present, g tube in palce 8. Lower extremities: no clubbing, cyanosis, no  edema 9. Neurologically Grossly intact, moving all 4 extremities equally  10. MSK: Normal range of motion    Chart has been  reviewed  ______________________________________________________________________________________________  Assessment/Plan  68 y.o. male with medical history significant of oropharyngeal cancer, HTN hLD   Admitted for  oropharyngeal bleed   Present on Admission:  Hematemesis  Essential hypertension, benign     Essential hypertension, benign Allow permissive HTN   Hematemesis As per hx seems more consistent with oropharyngeal bleeding Pt denies vomiting ENT consulted  Follow serial CBC  Npo  Observe in progressive    Other plan as per orders.  DVT prophylaxis:  SCD       Code Status:    Code Status: Prior FULL CODE as per patient   I had personally discussed CODE STATUS with patient and family   ACP   none   Family Communication:   Family at  Bedside  plan of care was discussed  with Daughter, Wife,    Diet  Diet Orders (From admission, onward)     Start     Ordered   09/07/23 1710  Diet NPO time specified  Diet effective now        09/07/23 1710            Disposition Plan:       To home once workup is complete and patient is stable   Following barriers for discharge:                            Bleeding resolved                               Anemia  h/H stable                                                     Will need consultants to evaluate patient prior to discharge     Consults called: ENT     Admission status:  ED Disposition     ED Disposition  Admit   Condition  --   Comment  Hospital Area: [facility] [[id number]]  Level of Care: Progressive [102]  Admit to Progressive based on following criteria: GI, ENDOCRINE disease patients with GI bleeding, acute liver failure or pancreatitis, stable with diabetic ketoacidosis or thyrotoxicosis (hypothyroid) state.  May place patient in observation at [facility] or [facility] if equivalent level of care is available:: No  Covid Evaluation: Asymptomatic - no recent exposure (last 10  days) testing not required  Diagnosis: Hematemesis [578.0.ICD-9-CM]  Admitting Physician: Therisa Doyne [[id number]]  Attending Physician: Therisa Doyne [[id number]]           Obs    Level of care      progressive     tele indefinitely please discontinue once patient no longer qualifies COVID-19 Labs    Anthony Campos 09/07/2023, 11:31 PM    [facility]     after 2 AM please page floor coverage PA If 7AM-7PM, please contact the day team taking care of the patient using [email or web address]

## 2023-09-07 NOTE — ED Triage Notes (Signed)
 Pt c.o hematemesis since 130pm today. Hz of throat cancer, recently finished chemo and radiation. Denies taking a blood thinner, denies difficulty breathing.

## 2023-09-07 NOTE — Assessment & Plan Note (Addendum)
 As per hx seems more consistent with oropharyngeal bleeding Pt denies vomiting ENT consulted  Follow serial CBC  Npo  Observe in progressive

## 2023-09-07 NOTE — ED Provider Notes (Signed)
 [place] [facility] DE[facility]MENT AT [facility] Provider Note   CSN: [id number] Arrival date & time: 09/07/23  1651     History  Chief Complaint  Patient presents with   Hematemesis    Anthony Campos is a 68 y.o. 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 Patient endorsing 3-4 episodes of hematemesis starting around 1:15PM today. Estimating around 2-3 cups of blood with each vomiting episode. Denies blood thinners. Denies any other recent infectious symptoms.  HPI     Home Medications Prior to Admission medications   Medication Sig Start Date End Date Taking? Authorizing Provider  pilocarpine (SALAGEN) 5 MG tablet Place 5 mg into feeding tube 3 (three) times daily.   Yes [provider]      Allergies    Patient has no known allergies.    Review of Systems   Review of Systems  Gastrointestinal:        Hematemesis    Physical Exam Updated Vital Signs BP 108/74   Pulse 74   Temp (!) 97.1 F (36.2 C) (Temporal)   Resp 15   Ht 6\' 2"  (1.88 m)   Wt 78 kg   SpO2 100%   BMI 22.08 kg/m  Physical Exam Vitals and nursing note reviewed.  Constitutional:      General: He is not in acute distress.    Appearance: He is not toxic-appearing.  HENT:     Head: Normocephalic and atraumatic.     Mouth/Throat:     Mouth: Mucous membranes are moist.     Comments: Some blood noticed in the posterior right oropharynx without oozing or pulsatile flow. No bleeding from nose. Eyes:     General: No scleral icterus.       Right eye: No discharge.        Left eye: No discharge.     Conjunctiva/sclera: Conjunctivae normal.  Cardiovascular:     Rate and Rhythm: Normal rate and regular rhythm.     Pulses: Normal pulses.     Heart sounds: Normal heart sounds. No murmur heard. Pulmonary:     Effort: Pulmonary effort is normal. No respiratory distress.      Breath sounds: Normal breath sounds. No wheezing, rhonchi or rales.  Abdominal:     Tenderness: There is no abdominal tenderness.  Musculoskeletal:     Right lower leg: No edema.     Left lower leg: No edema.  Skin:    General: Skin is warm and dry.     Findings: No rash.  Neurological:     General: No focal deficit present.     Mental Status: He is alert and oriented to person, place, and time. Mental status is at baseline.  Psychiatric:        Mood and Affect: Mood normal.     ED Results / Procedures / Treatments   Labs (all labs ordered are listed, but only abnormal results are displayed) Labs Reviewed  COMPREHENSIVE METABOLIC PANEL - Abnormal; Notable for the following components:      Result Value   BUN 30 (*)    All other components within normal limits  CBC WITH DIFFERENTIAL/PLATELET - Abnormal; Notable for the following components:   RBC 3.51 (*)    Hemoglobin 11.2 (*)    HCT 34.4 (*)    All other components within normal limits  I-STAT CHEM 8, ED - Abnormal; Notable for  the following components:   BUN 36 (*)    Calcium, Ion 1.09 (*)    Hemoglobin 11.9 (*)    HCT 35.0 (*)    All other components within normal limits  PROTIME-INR  LIPASE, BLOOD  APTT  TYPE AND SCREEN    EKG None  Radiology CT Soft Tissue Neck W Contrast Result Date: 09/07/2023 CLINICAL DATA:  Initial evaluation for soft tissue infection suspected. EXAM: CT NECK WITH CONTRAST TECHNIQUE: Multidetector CT imaging of the neck was performed using the standard protocol following the bolus administration of intravenous contrast. RADIATION DOSE REDUCTION: This exam was performed according to the departmental dose-optimization program which includes automated exposure control, adjustment of the mA and/or kV according to patient size and/or use of iterative reconstruction technique. CONTRAST:  75mL OMNIPAQUE IOHEXOL 350 MG/ML SOLN COMPARISON:  Prior CT from 07/29/2022 FINDINGS: Pharynx and larynx: Oral  cavity within normal limits. There is mild asymmetric mucosal thickening about the posterior left oropharynx (series 3, image 59). No visible discrete mass. No significant mucosal edema. No retropharyngeal collection. Negative epiglottis. Remainder of the hypopharynx and supraglottic larynx within normal limits. Negative glottis. Subglottic airway clear. Salivary glands: Asymmetric atrophy noted involving the right parotid gland. Parotid and submandibular glands otherwise unremarkable. Thyroid: Normal. Lymph nodes: No enlarged or pathologic adenopathy within the neck. Vascular: Normal intravascular enhancement seen within the neck. Aortic atherosclerosis noted. Limited intracranial: Unremarkable. Visualized orbits: Unremarkable. Mastoids and visualized paranasal sinuses: Chronic ethmoidal and maxillary sinusitis, right worse than left. Mastoid air cells and middle ear cavities are clear. Skeleton: No worrisome osseous lesions. Mild-to-moderate spondylosis at C4-5 through C6-7. Patient is largely edentulous. Upper chest: Emphysema.  No other acute finding. Other: None. IMPRESSION: 1. Mild asymmetric mucosal thickening about the posterior left oropharynx, nonspecific. Correlation with direct visualization to ensure no underlying mucosal based lesion is present at this location. Sequelae of pharyngitis would be the primary differential consideration. 2. No other acute abnormality within the neck.  No adenopathy. 3. Chronic ethmoidal and maxillary sinusitis, right worse than left. Aortic Atherosclerosis (ICD10-I70.0) and Emphysema (ICD10-J43.9). Electronically Signed   By: Rise Mu M.D.   On: 09/07/2023 21:06   DG Chest 2 View Result Date: 09/07/2023 CLINICAL DATA:  Hematemesis EXAM: CHEST - 2 VIEW COMPARISON:  07/23/2017 FINDINGS: The heart size and mediastinal contours are within normal limits. Both lungs are clear. The visualized skeletal structures are unremarkable. IMPRESSION: No active  cardiopulmonary disease. Electronically Signed   By: Sharlet Salina M.D.   On: 09/07/2023 17:32    Procedures Procedures    Medications Ordered in ED Medications  sodium chloride 0.9 % bolus 1,000 mL (0 mLs Intravenous Stopped 09/07/23 1909)    And  0.9 %  sodium chloride infusion ( Intravenous New Bag/Given 09/07/23 1935)  pantoprazole (PROTONIX) injection 80 mg (80 mg Intravenous Given 09/07/23 1737)  tranexamic acid (CYKLOKAPRON) 1000 MG/10ML nebulizer solution 500 mg (500 mg Nebulization Given 09/07/23 1913)  ondansetron (ZOFRAN) injection 4 mg (4 mg Intravenous Given 09/07/23 1737)  iohexol (OMNIPAQUE) 350 MG/ML injection 75 mL (75 mLs Intravenous Contrast Given 09/07/23 2015)    ED Course/ Medical Decision Making/ A&P Clinical Course as of 09/07/23 2258  Sun Sep 07, 2023  2141 Dr Sharol Harness from ENT will see the patient in the morning.  Agrees with current management. [RP]    Clinical Course User Index [RP] Rondel Baton, MD  Medical Decision Making Amount and/or Complexity of Data Reviewed Labs: ordered. Radiology: ordered.  Risk Prescription drug management. Decision regarding hospitalization.   This patient presents to the ED for concern of hematemesis, this involves an extensive number of treatment options, and is a complaint that carries with it a high risk of complications and morbidity.  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 Physical exam with blood on the posterior oropharynx without active oozing or pulsatile flow. Rest of physical exam reassuring. Provided patient with zofran and vomiting has ceased. Patient  initially mildly hypotensive - provided with 1L IV fluids. I Ordered, and personally interpreted labs.  Please within normal limits.  CMP unremarkable.  aPTT/PT-INR within normal limits.  CBC with mild anemia-hemoglobin 11.2.  No leukocytosis. I ordered imaging studies including chest xray and CT soft tissue neck. I independently visualized and interpreted imaging which showed  Mild asymmetric mucosal thickening about the posterior left oropharynx. I agree with the radiologist interpretation Dr. Eloise Harman able to consult with ENT on-call Dr. Sharol Harness who agrees to see patient in the morning. I have reviewed the patients home medicines and have made adjustments as needed Dr. Kara Pacer admitting provider.  Social Determinants of Health:  none         Final Clinical Impression(s) / ED Diagnoses Final diagnoses:  Hematemesis, unspecified whether nausea present    Rx / DC Orders ED Discharge Orders     None         Margarita Rana 09/07/23 2258    Rondel Baton, MD 09/10/23 [phone]

## 2023-09-08 DIAGNOSIS — K1379 Other lesions of oral mucosa: Secondary | ICD-10-CM | POA: Diagnosis not present

## 2023-09-08 DIAGNOSIS — I10 Essential (primary) hypertension: Secondary | ICD-10-CM | POA: Diagnosis not present

## 2023-09-08 LAB — CBC
HCT: 31.7 % — ABNORMAL LOW (ref 39.0–52.0)
HCT: 37.6 % — ABNORMAL LOW (ref 39.0–52.0)
Hemoglobin: 10.2 g/dL — ABNORMAL LOW (ref 13.0–17.0)
Hemoglobin: 12.1 g/dL — ABNORMAL LOW (ref 13.0–17.0)
MCH: 32.1 pg (ref 26.0–34.0)
MCH: 31.9 pg (ref 26.0–34.0)
MCHC: 32.2 g/dL (ref 30.0–36.0)
MCHC: 32.2 g/dL (ref 30.0–36.0)
MCV: 99.7 fL (ref 80.0–100.0)
MCV: 99.2 fL (ref 80.0–100.0)
Platelets: 134 10*3/uL — ABNORMAL LOW (ref 150–400)
Platelets: 107 10*3/uL — ABNORMAL LOW (ref 150–400)
RBC: 3.18 MIL/uL — ABNORMAL LOW (ref 4.22–5.81)
RBC: 3.79 MIL/uL — ABNORMAL LOW (ref 4.22–5.81)
RDW: 14.1 % (ref 11.5–15.5)
RDW: 14.2 % (ref 11.5–15.5)
WBC: 3.5 10*3/uL — ABNORMAL LOW (ref 4.0–10.5)
WBC: 4.2 10*3/uL (ref 4.0–10.5)
nRBC: 0 % (ref 0.0–0.2)
nRBC: 0 % (ref 0.0–0.2)

## 2023-09-08 LAB — IRON AND TIBC
Iron: 61 ug/dL (ref 45–182)
Saturation Ratios: 21 % (ref 17.9–39.5)
TIBC: 288 ug/dL (ref 250–450)
UIBC: 227 ug/dL

## 2023-09-08 LAB — FERRITIN: Ferritin: 68 ng/mL (ref 24–336)

## 2023-09-08 LAB — COMPREHENSIVE METABOLIC PANEL
ALT: 15 U/L (ref 0–44)
AST: 28 U/L (ref 15–41)
Albumin: 3.1 g/dL — ABNORMAL LOW (ref 3.5–5.0)
Alkaline Phosphatase: 43 U/L (ref 38–126)
Anion gap: 8 (ref 5–15)
BUN: 24 mg/dL — ABNORMAL HIGH (ref 8–23)
CO2: 25 mmol/L (ref 22–32)
Calcium: 8.4 mg/dL — ABNORMAL LOW (ref 8.9–10.3)
Chloride: 105 mmol/L (ref 98–111)
Creatinine, Ser: 0.93 mg/dL (ref 0.61–1.24)
GFR, Estimated: >60 mL/min (ref >60)
Glucose, Bld: 76 mg/dL (ref 70–99)
Potassium: 4.3 mmol/L (ref 3.5–5.1)
Sodium: 138 mmol/L (ref 135–145)
Total Bilirubin: 0.6 mg/dL (ref 0.0–1.2)
Total Protein: 6.6 g/dL (ref 6.5–8.1)

## 2023-09-08 LAB — FOLATE: Folate: 25.4 ng/mL (ref >5.9)

## 2023-09-08 LAB — HIV ANTIBODY (ROUTINE TESTING W REFLEX): HIV Screen 4th Generation wRfx: NONREACTIVE

## 2023-09-08 LAB — MAGNESIUM: Magnesium: 2.1 mg/dL (ref 1.7–2.4)

## 2023-09-08 MED ORDER — FREE WATER
200.0000 mL | Status: DC
  Administered 2023-09-08 – 2023-09-09 (×5): 200 mL

## 2023-09-08 MED ORDER — PANTOPRAZOLE SODIUM 40 MG IV SOLR: 40.0000 mg | Freq: Two times a day (BID) | INTRAVENOUS | Status: DC

## 2023-09-08 MED ORDER — PILOCARPINE HCL 5 MG PO TABS
5.0000 mg | ORAL_TABLET | Freq: Three times a day (TID) | ORAL | Status: DC
  Administered 2023-09-08 (×2): 5 mg
  Filled 2023-09-08 (×5): qty 1

## 2023-09-08 MED ORDER — ACETAMINOPHEN 325 MG PO TABS: 650.0000 mg | ORAL_TABLET | Freq: Four times a day (QID) | ORAL | Status: DC | PRN

## 2023-09-08 MED ORDER — ACETAMINOPHEN 650 MG RE SUPP: 650.0000 mg | Freq: Four times a day (QID) | RECTAL | Status: DC | PRN

## 2023-09-08 MED ORDER — PANTOPRAZOLE SODIUM 40 MG IV SOLR
40.0000 mg | INTRAVENOUS | Status: DC
  Administered 2023-09-08: 40 mg via INTRAVENOUS
  Filled 2023-09-08: qty 10

## 2023-09-08 MED ORDER — SODIUM CHLORIDE 0.9 % IV SOLN: INTRAVENOUS | Status: AC

## 2023-09-08 MED ORDER — ONDANSETRON HCL 4 MG/2ML IJ SOLN: 4.0000 mg | Freq: Four times a day (QID) | INTRAMUSCULAR | Status: DC | PRN

## 2023-09-08 MED ORDER — ONDANSETRON HCL 4 MG PO TABS: 4.0000 mg | ORAL_TABLET | Freq: Four times a day (QID) | ORAL | Status: DC | PRN

## 2023-09-08 NOTE — Plan of Care (Signed)

## 2023-09-08 NOTE — TOC Initial Note (Signed)
 Transition of Care [facility]) - Initial/Assessment Note    Patient Details  Name: Anthony Campos MRN: [id number] Date of Birth: 1956/06/23  [facility] CM/SW Contact:    Kermit Balo, RN Phone Number: 09/08/2023, 4:07 PM  Clinical Narrative:                  Pt states he has someone with him most of the time. Family provides needed transportation.  Pt only takes medications for mouth moisture.  Plan is for home when medically ready. TOC following.   Expected Discharge Plan: Home/Self Care Barriers to Discharge: Continued Medical Work up   Patient Goals and CMS Choice            Expected Discharge Plan and Services   Discharge Planning Services: CM Consult   Living arrangements for the past 2 months: Single Family Home                                      Prior Living Arrangements/Services Living arrangements for the past 2 months: Single Family Home Lives with:: Spouse Patient language and need for interpreter reviewed:: Yes Do you feel safe going back to the place where you live?: Yes        Care giver support system in place?: Yes (comment) Current home services: DME (cane) Criminal Activity/Legal Involvement Pertinent to Current Situation/Hospitalization: No - Comment as needed  Activities of Daily Living   ADL Screening (condition at time of admission) Independently performs ADLs?: Yes (appropriate for developmental age) Is the patient deaf or have difficulty hearing?: No Does the patient have difficulty seeing, even when wearing glasses/contacts?: No Does the patient have difficulty concentrating, remembering, or making decisions?: No  Permission Sought/Granted                  Emotional Assessment Appearance:: Appears stated age Attitude/Demeanor/Rapport: Engaged Affect (typically observed): Accepting Orientation: : Oriented to Self, Oriented to Place, Oriented to Situation, Oriented to  Time   Psych Involvement: No  (comment)  Admission diagnosis:  Hematemesis [K92.0] Hematemesis, unspecified whether nausea present [K92.0] Patient Active Problem List   Diagnosis Date Noted   Hematemesis 09/07/2023   Conjunctival irritation 01/18/2019   H/O viral gastroenteritis 10/01/2017   Radiculopathy 07/23/2017   Insect bite 12/10/2016   Healthcare maintenance 02/16/2015   TOBACCO ABUSE 12/22/2009   Essential hypertension, benign 10/20/2008   ERECTILE DYSFUNCTION 07/24/2006   Hyperlipidemia, mild 05/20/2006   PCP:  Default, Provider, MD Pharmacy:   [facility] [phone] - , [place] - [address] AT [facility] OF [address] & [address] [place] [address] Phone: [phone] Fax: [phone]     Social Drivers of Health (SDOH) Social History: SDOH Screenings   Food Insecurity: No Food Insecurity (09/08/2023)  Housing: Low Risk  (09/08/2023)  Transportation Needs: No Transportation Needs (09/08/2023)  Utilities: Not At Risk (09/08/2023)  Depression (PHQ2-9): Low Risk  (01/18/2019)  Social Connections: Socially Integrated (09/08/2023)  Tobacco Use: Medium Risk (09/07/2023)   SDOH Interventions:     Readmission Risk Interventions     No data to display

## 2023-09-08 NOTE — Progress Notes (Addendum)
 [facility] PROGRESS NOTE   Anthony Campos ZOX:[id number] DOB: 04-03-1956 DOA: 09/07/2023  PCP: Default, Provider, MD  Brief History: 68 y.o. male with medical history significant of oropharyngeal cancer, essential hypertension, hyperlipidemia presented with bloody emesis.  It was determined that his bleeding was from the oropharyngeal lesion rather than true hematemesis.  He was hospitalized for further management.  Consultants: ENT  Procedures: None yet    Subjective/Interval History: Patient has has not had any further bleeding episodes in the last 4 to 5 hours.  Denies any throat pain.  No nausea vomiting.  His daughter and wife are at the bedside.    Assessment/Plan:  Oropharyngeal bleeding in the setting of oropharyngeal cancer He has completed chemo and radiation several years ago.  He is followed closely by oncology in [facility].  Also followed by ENT in [facility]. CT scan of the neck showed mild asymmetric mucosal thickening in the left oropharynx.  No other acute abnormalities were noted. He was given nebulized tranexamic acid. ENT has been consulted.  Await their input.  They plan to directly visualize that area today.  Discussed with Dr. Sharol Harness.  He is hemodynamically stable.  Acute blood loss anemia Hemoglobin was 11.2 at admission.  Hemoglobin dropped down to 9.9.  Await labs from this morning.  Bleeding appears to have subsided.  Hopefully hemoglobin will equilibrate.    History of essential hypertension Soft blood pressures noted.  Currently off of antihypertensives.  Oropharyngeal dysphagia Patient has a PEG tube through which he takes feeds every 7-8 hours.  He only has mashed potato and pured food by mouth.  His medications through his PEG tube. Depending on ENT evaluation will determine when to resume his tube feedings.  DVT Prophylaxis: SCDs Code Status: Full code Family Communication: Discussed with the patient and his family Disposition  Plan: Hopefully return home later today or tomorrow  Status is: Observation The patient remains OBS appropriate and may or may not d/c before 2 midnights.     Medications: Scheduled:  pantoprazole (PROTONIX) IV  40 mg Intravenous Q12H   pilocarpine  5 mg Per Tube TID   Continuous:  sodium chloride     PRN:  Antibiotics: Anti-infectives (From admission, onward)    None       Objective:  Vital Signs  Vitals:   09/08/23 0515 09/08/23 0535 09/08/23 0830 09/08/23 0915  BP: 98/67  120/72 110/65  Pulse: 60     Resp: 18  19 19   Temp:  (!) 97.4 F (36.3 C)    TempSrc:  Temporal    SpO2: 100%  99% 100%  Weight:      Height:       No intake or output data in the 24 hours ending 09/08/23 0947 Filed Weights   09/07/23 1656  Weight: 78 kg    General appearance: Awake alert.  In no distress Resp: Clear to auscultation bilaterally.  Normal effort Cardio: S1-S2 is normal regular.  No S3-S4.  No rubs murmurs or bruit GI: Abdomen is soft.  Nontender nondistended.  Bowel sounds are present normal.  No masses organomegaly.  PEG tube is noted Extremities: No edema.  Full range of motion of lower extremities. Neurologic: Alert and oriented x3.  No focal neurological deficits.    Lab Results:  Data Reviewed: I have personally reviewed following labs and reports of the imaging studies  CBC: Recent Labs  Lab 09/07/23 1710 09/07/23 1719 09/07/23 2309  WBC 4.5  --  4.9  NEUTROABS 2.3  --   --   HGB 11.2* 11.9* 9.9*  HCT 34.4* 35.0* 30.4*  MCV 98.0  --  99.0  PLT 157  --  135*    Basic Metabolic Panel: Recent Labs  Lab 09/07/23 1710 09/07/23 1719  NA 135 139  K 4.0 4.2  CL 99 105  CO2 25  --   GLUCOSE 90 87  BUN 30* 36*  CREATININE 0.90 1.10  CALCIUM 9.0  --     GFR: Estimated Creatinine Clearance: 70.9 mL/min (by C-G formula based on SCr of 1.1 mg/dL).  Liver Function Tests: Recent Labs  Lab 09/07/23 1710  AST 32  ALT 20  ALKPHOS 60  BILITOT 0.5   PROT 8.0  ALBUMIN 3.6    Recent Labs  Lab 09/07/23 1710  LIPASE 29   Coagulation Profile: Recent Labs  Lab 09/07/23 1710  INR 1.1     Anemia Panel: Recent Labs    09/07/23 2309  RETICCTPCT 1.1   Radiology Studies: CT Soft Tissue Neck W Contrast Result Date: 09/07/2023 CLINICAL DATA:  Initial evaluation for soft tissue infection suspected. EXAM: CT NECK WITH CONTRAST TECHNIQUE: Multidetector CT imaging of the neck was performed using the standard protocol following the bolus administration of intravenous contrast. RADIATION DOSE REDUCTION: This exam was performed according to the departmental dose-optimization program which includes automated exposure control, adjustment of the mA and/or kV according to patient size and/or use of iterative reconstruction technique. CONTRAST:  75mL OMNIPAQUE IOHEXOL 350 MG/ML SOLN COMPARISON:  Prior CT from 07/29/2022 FINDINGS: Pharynx and larynx: Oral cavity within normal limits. There is mild asymmetric mucosal thickening about the posterior left oropharynx (series 3, image 59). No visible discrete mass. No significant mucosal edema. No retropharyngeal collection. Negative epiglottis. Remainder of the hypopharynx and supraglottic larynx within normal limits. Negative glottis. Subglottic airway clear. Salivary glands: Asymmetric atrophy noted involving the right parotid gland. Parotid and submandibular glands otherwise unremarkable. Thyroid: Normal. Lymph nodes: No enlarged or pathologic adenopathy within the neck. Vascular: Normal intravascular enhancement seen within the neck. Aortic atherosclerosis noted. Limited intracranial: Unremarkable. Visualized orbits: Unremarkable. Mastoids and visualized paranasal sinuses: Chronic ethmoidal and maxillary sinusitis, right worse than left. Mastoid air cells and middle ear cavities are clear. Skeleton: No worrisome osseous lesions. Mild-to-moderate spondylosis at C4-5 through C6-7. Patient is largely edentulous.  Upper chest: Emphysema.  No other acute finding. Other: None. IMPRESSION: 1. Mild asymmetric mucosal thickening about the posterior left oropharynx, nonspecific. Correlation with direct visualization to ensure no underlying mucosal based lesion is present at this location. Sequelae of pharyngitis would be the primary differential consideration. 2. No other acute abnormality within the neck.  No adenopathy. 3. Chronic ethmoidal and maxillary sinusitis, right worse than left. Aortic Atherosclerosis (ICD10-I70.0) and Emphysema (ICD10-J43.9). Electronically Signed   By: Rise Mu M.D.   On: 09/07/2023 21:06   DG Chest 2 View Result Date: 09/07/2023 CLINICAL DATA:  Hematemesis EXAM: CHEST - 2 VIEW COMPARISON:  07/23/2017 FINDINGS: The heart size and mediastinal contours are within normal limits. Both lungs are clear. The visualized skeletal structures are unremarkable. IMPRESSION: No active cardiopulmonary disease. Electronically Signed   By: Sharlet Salina M.D.   On: 09/07/2023 17:32       LOS: 0 days   Safiatou Islam Rito Ehrlich  [facility] Pager on [email or web address]  09/08/2023, 9:47 AM

## 2023-09-08 NOTE — Progress Notes (Signed)
 Patient to 5W01 at this time

## 2023-09-08 NOTE — ED Notes (Signed)
 First contact with pt at this time, family at bedside. ENT also at bedside at this time.

## 2023-09-08 NOTE — Care Management Obs Status (Signed)
 MEDICARE OBSERVATION STATUS NOTIFICATION   Patient Details  Name: Anthony Campos MRN: [id number] Date of Birth: 09-May-1956   Medicare Observation Status Notification Given:  Yes    Kermit Balo, RN 09/08/2023, 3:56 PM

## 2023-09-08 NOTE — Plan of Care (Signed)
   Problem: Education: Goal: Knowledge of General Education information will improve Description Including pain rating scale, medication(s)/side effects and non-pharmacologic comfort measures Outcome: Progressing   Problem: Health Behavior/Discharge Planning: Goal: Ability to manage health-related needs will improve Outcome: Progressing

## 2023-09-08 NOTE — Consult Note (Signed)
 Anthony Campos is an 68 y.o. male.    Chief Complaint:  Oropharyngeal bleeding  HPI: 68 year old male with a history of t4a N1 tonsillar carcinoma who has been followed by Dr. Manson Passey at [facility].  He had chemoradiotherapy which she completed in February 2021.  Overall he has been doing reasonably well.  Yesterday he noted a significant amount of bleeding from the pharynx and was seen in the emergency room where he was given nebulized TXA which resolved the bleeding at that time.  He is also had intermittent issues with epistaxis and dryness in the nose for some time after radiation.  Past Medical History:  Diagnosis Date   Erectile dysfunction    Hyperlipidemia    Hypertension    Infection of skin and subcutaneous tissue    Lipoma    Tobacco abuse    hx    Past Surgical History:  Procedure Laterality Date   ABDOMINAL EXPOSURE N/A 07/23/2017   Procedure: ABDOMINAL EXPOSURE;  Surgeon: Larina Earthly, MD;  Location: [facility] OR;  Service: Vascular;  Laterality: N/A;   ANTERIOR LAT LUMBAR FUSION N/A 07/23/2017   Procedure: LUMBAR 3-4 ANTERIOR LATERAL LUMBAR FUSION 1 LEVEL;  Surgeon: Estill Bamberg, MD;  Location: [facility] OR;  Service: Orthopedics;  Laterality: N/A;   ANTERIOR LUMBAR FUSION N/A 07/23/2017   Procedure: LUMBAR 4-5, LUMBAR 5-SACRUM 1, ANTERIOR LUMBAR INTERBODY FUSION WITH INSTRUMENTATION, ALLOGRAFT REQUESTED TIME 6 HRS;  Surgeon: Estill Bamberg, MD;  Location: [facility] OR;  Service: Orthopedics;  Laterality: N/A;  LUMBAR 4-5, LUMBAR 5-SACRUM 1, ANTERIOR INTERBODY FUSION WITH INSTRUMENTATION AND ALLOGRAFT REQUESTED TIME 6 HRS   COLONOSCOPY  04/12/2016   Dr. Christella Hartigan removed one polyp   CYSTOSCOPY  07/23/2017   Procedure: CYSTOSCOPY FLEXIBLE, ATTEMPTED CATHETER INSERTION;  Surgeon: Estill Bamberg, MD;  Location: [facility] OR;  Service: Orthopedics;;   CYSTOSCOPY WITH URETHRAL DILATATION N/A 08/28/2017   Procedure: CYSTOSCOPY WITH URETHRAL DILATATION/BALLOON DILATION, Removal of suprapubic catheter;  Surgeon:  Marcine Matar, MD;  Location: [facility];  Service: Urology;  Laterality: N/A;   HERNIA REPAIR  2005   Right hernia repair    INSERTION OF SUPRAPUBIC CATHETER Right 07/23/2017   Procedure: INSERTION OF SUPRAPUBIC CATHETER;  Surgeon: Estill Bamberg, MD;  Location: [facility] OR;  Service: Orthopedics;  Laterality: Right;    Family History  Problem Relation Age of Onset   Depression Sister    Heart disease Neg Hx    Hypertension Neg Hx    Stroke Neg Hx    Colon polyps Neg Hx    Colon cancer Neg Hx    Rectal cancer Neg Hx    Stomach cancer Neg Hx    Esophageal cancer Neg Hx     Social History:  reports that he has quit smoking. His smoking use included cigarettes. He has never used smokeless tobacco. He reports that he does not currently use alcohol. He reports that he does not use drugs.  Allergies: No Known Allergies  Medications Prior to Admission  Medication Sig Dispense Refill   pilocarpine (SALAGEN) 5 MG tablet Place 5 mg into feeding tube 3 (three) times daily.      Results for orders placed or performed during the hospital encounter of 09/07/23 (from the past 48 hours)  Comprehensive metabolic panel     Status: Abnormal   Collection Time: 09/07/23  5:10 PM  Result Value Ref Range   Sodium 135 135 - 145 mmol/L   Potassium 4.0 3.5 - 5.1 mmol/L   Chloride 99  98 - 111 mmol/L   CO2 25 22 - 32 mmol/L   Glucose, Bld 90 70 - 99 mg/dL    Comment: Glucose reference range applies only to samples taken after fasting for at least 8 hours.   BUN 30 (H) 8 - 23 mg/dL   Creatinine, Ser 1.61 0.61 - 1.24 mg/dL   Calcium 9.0 8.9 - 09.6 mg/dL   Total Protein 8.0 6.5 - 8.1 g/dL   Albumin 3.6 3.5 - 5.0 g/dL   AST 32 15 - 41 U/L   ALT 20 0 - 44 U/L   Alkaline Phosphatase 60 38 - 126 U/L   Total Bilirubin 0.5 0.0 - 1.2 mg/dL   GFR, Estimated >04 >54 mL/min    Comment: (NOTE) Calculated using the CKD-EPI Creatinine Equation (2021)    Anion gap 11 5 - 15    Comment:  Performed at [facility], [address] [place], [place] [address]  CBC with Differential     Status: Abnormal   Collection Time: 09/07/23  5:10 PM  Result Value Ref Range   WBC 4.5 4.0 - 10.5 K/uL   RBC 3.51 (L) 4.22 - 5.81 MIL/uL   Hemoglobin 11.2 (L) 13.0 - 17.0 g/dL   HCT 91.4 (L) 78.2 - 95.6 %   MCV 98.0 80.0 - 100.0 fL   MCH 31.9 26.0 - 34.0 pg   MCHC 32.6 30.0 - 36.0 g/dL   RDW 21.3 08.6 - 57.8 %   Platelets 157 150 - 400 K/uL   nRBC 0.0 0.0 - 0.2 %   Neutrophils Relative % 51 %   Neutro Abs 2.3 1.7 - 7.7 K/uL   Lymphocytes Relative 21 %   Lymphs Abs 0.9 0.7 - 4.0 K/uL   Monocytes Relative 23 %   Monocytes Absolute 1.0 0.1 - 1.0 K/uL   Eosinophils Relative 4 %   Eosinophils Absolute 0.2 0.0 - 0.5 K/uL   Basophils Relative 1 %   Basophils Absolute 0.0 0.0 - 0.1 K/uL   Immature Granulocytes 0 %   Abs Immature Granulocytes 0.01 0.00 - 0.07 K/uL    Comment: Performed at [facility], [address], [place], [place] [address]  Protime-INR     Status: None   Collection Time: 09/07/23  5:10 PM  Result Value Ref Range   Prothrombin Time 14.4 11.4 - 15.2 seconds   INR 1.1 0.8 - 1.2    Comment: (NOTE) INR goal varies based on device and disease states. Performed at [facility], [address], [place], [place] [address]   Lipase, blood     Status: None   Collection Time: 09/07/23  5:10 PM  Result Value Ref Range   Lipase 29 11 - 51 U/L    Comment: Performed at [facility], [address] [place], [place] [address]  APTT     Status: None   Collection Time: 09/07/23  5:10 PM  Result Value Ref Range   aPTT 30 24 - 36 seconds    Comment: Performed at [facility], [address], [place], [place] [address]  Type and screen [facility]     Status: None   Collection Time: 09/07/23  5:12 PM  Result Value Ref Range   ABO/RH(D) O POS    Antibody Screen NEG    Sample Expiration      09/10/2023,2359 Performed at [facility], [address], [place], [place] [address]  I-Stat Chem 8, ED     Status: Abnormal   Collection Time: 09/07/23  5:19 PM  Result Value Ref Range   Sodium 139 135 - 145 mmol/L   Potassium 4.2 3.5 - 5.1 mmol/L   Chloride 105 98 - 111 mmol/L   BUN 36 (H) 8 - 23 mg/dL   Creatinine, Ser 4.54 0.61 - 1.24 mg/dL   Glucose, Bld 87 70 - 99 mg/dL    Comment: Glucose reference range applies only to samples taken after fasting for at least 8 hours.   Calcium, Ion 1.09 (L) 1.15 - 1.40 mmol/L   TCO2 29 22 - 32 mmol/L   Hemoglobin 11.9 (L) 13.0 - 17.0 g/dL   HCT 09.8 (L) 11.9 - 14.7 %  CBC     Status: Abnormal   Collection Time: 09/07/23 11:09 PM  Result Value Ref Range   WBC 4.9 4.0 - 10.5 K/uL   RBC 3.07 (L) 4.22 - 5.81 MIL/uL   Hemoglobin 9.9 (L) 13.0 - 17.0 g/dL   HCT 82.9 (L) 56.2 - 13.0 %   MCV 99.0 80.0 - 100.0 fL   MCH 32.2 26.0 - 34.0 pg   MCHC 32.6 30.0 - 36.0 g/dL   RDW 86.5 78.4 - 69.6 %   Platelets 135 (L) 150 - 400 K/uL    Comment: REPEATED TO VERIFY   nRBC 0.0 0.0 - 0.2 %    Comment: Performed at [facility], [address] [place], [place] [address]  Reticulocytes     Status: Abnormal   Collection Time: 09/07/23 11:09 PM  Result Value Ref Range   Retic Ct Pct 1.1 0.4 - 3.1 %   RBC. 3.08 (L) 4.22 - 5.81 MIL/uL   Retic Count, Absolute 33.3 19.0 - 186.0 K/uL   Immature Retic Fract 9.4 2.3 - 15.9 %    Comment: Performed at [facility], [address] [place], [place] [address]  Magnesium     Status: None   Collection Time: 09/08/23 12:53 PM  Result Value Ref Range   Magnesium 2.1 1.7 - 2.4 mg/dL    Comment: Performed at [facility], [address] [place], [place] [address]  CBC     Status: Abnormal   Collection Time: 09/08/23 12:53 PM  Result Value Ref Range   WBC 3.5 (L) 4.0 - 10.5 K/uL   RBC 3.18 (L) 4.22 - 5.81 MIL/uL   Hemoglobin 10.2 (L) 13.0 - 17.0 g/dL   HCT 72.5 (L) 36.6 - 44.0 %   MCV 99.7 80.0 - 100.0 fL   MCH 32.1 26.0 -  34.0 pg   MCHC 32.2 30.0 - 36.0 g/dL   RDW 34.7 42.5 - 95.6 %   Platelets 134 (L) 150 - 400 K/uL   nRBC 0.0 0.0 - 0.2 %    Comment: Performed at [facility], [address], [place], [place] [address]  Comprehensive metabolic panel     Status: Abnormal   Collection Time: 09/08/23 12:53 PM  Result Value Ref Range   Sodium 138 135 - 145 mmol/L   Potassium 4.3 3.5 - 5.1 mmol/L   Chloride 105 98 - 111 mmol/L   CO2 25 22 - 32 mmol/L   Glucose, Bld 76 70 - 99 mg/dL    Comment: Glucose reference range applies only to samples taken after fasting for at least 8 hours.   BUN 24 (H) 8 - 23 mg/dL   Creatinine, Ser 4.33 0.61 - 1.24  mg/dL   Calcium 8.4 (L) 8.9 - 10.3 mg/dL   Total Protein 6.6 6.5 - 8.1 g/dL   Albumin 3.1 (L) 3.5 - 5.0 g/dL   AST 28 15 - 41 U/L   ALT 15 0 - 44 U/L   Alkaline Phosphatase 43 38 - 126 U/L   Total Bilirubin 0.6 0.0 - 1.2 mg/dL   GFR, Estimated >82 >95 mL/min    Comment: (NOTE) Calculated using the CKD-EPI Creatinine Equation (2021)    Anion gap 8 5 - 15    Comment: Performed at [facility], [address] [place], [place] [address]   CT Soft Tissue Neck W Contrast Result Date: 09/07/2023 CLINICAL DATA:  Initial evaluation for soft tissue infection suspected. EXAM: CT NECK WITH CONTRAST TECHNIQUE: Multidetector CT imaging of the neck was performed using the standard protocol following the bolus administration of intravenous contrast. RADIATION DOSE REDUCTION: This exam was performed according to the departmental dose-optimization program which includes automated exposure control, adjustment of the mA and/or kV according to patient size and/or use of iterative reconstruction technique. CONTRAST:  75mL OMNIPAQUE IOHEXOL 350 MG/ML SOLN COMPARISON:  Prior CT from 07/29/2022 FINDINGS: Pharynx and larynx: Oral cavity within normal limits. There is mild asymmetric mucosal thickening about the posterior left oropharynx (series 3, image 59). No visible discrete  mass. No significant mucosal edema. No retropharyngeal collection. Negative epiglottis. Remainder of the hypopharynx and supraglottic larynx within normal limits. Negative glottis. Subglottic airway clear. Salivary glands: Asymmetric atrophy noted involving the right parotid gland. Parotid and submandibular glands otherwise unremarkable. Thyroid: Normal. Lymph nodes: No enlarged or pathologic adenopathy within the neck. Vascular: Normal intravascular enhancement seen within the neck. Aortic atherosclerosis noted. Limited intracranial: Unremarkable. Visualized orbits: Unremarkable. Mastoids and visualized paranasal sinuses: Chronic ethmoidal and maxillary sinusitis, right worse than left. Mastoid air cells and middle ear cavities are clear. Skeleton: No worrisome osseous lesions. Mild-to-moderate spondylosis at C4-5 through C6-7. Patient is largely edentulous. Upper chest: Emphysema.  No other acute finding. Other: None. IMPRESSION: 1. Mild asymmetric mucosal thickening about the posterior left oropharynx, nonspecific. Correlation with direct visualization to ensure no underlying mucosal based lesion is present at this location. Sequelae of pharyngitis would be the primary differential consideration. 2. No other acute abnormality within the neck.  No adenopathy. 3. Chronic ethmoidal and maxillary sinusitis, right worse than left. Aortic Atherosclerosis (ICD10-I70.0) and Emphysema (ICD10-J43.9). Electronically Signed   By: Rise Mu M.D.   On: 09/07/2023 21:06   DG Chest 2 View Result Date: 09/07/2023 CLINICAL DATA:  Hematemesis EXAM: CHEST - 2 VIEW COMPARISON:  07/23/2017 FINDINGS: The heart size and mediastinal contours are within normal limits. Both lungs are clear. The visualized skeletal structures are unremarkable. IMPRESSION: No active cardiopulmonary disease. Electronically Signed   By: Sharlet Salina M.D.   On: 09/07/2023 17:32    ROS: negative other than stated in HPI  Blood pressure  111/74, pulse 68, temperature 97.6 F (36.4 C), temperature source Oral, resp. rate 14, height 6\' 2"  (1.88 m), weight 78 kg, SpO2 100%.  PHYSICAL EXAM: General: Resting comfortably in NAD  Oropharynx: Dry oral mucosa. Post-treatment changes with ulceration of the right soft palate and right lateral pharyngeal wall/tonsillar fossa with recent bleeding. No defined mass noted.  Nose: severe right septal deviation, significant left nasal crusting  Bedside nasopharyngoscopy: attempted but unable to pass scope past septal deviation on right and extensive nasal crusting on left.  Assessment/Plan  Oropharyngeal hemorrhage I discussed with the patient and  family there is no evidence of tumor recurrence at this time.  Likely the ulceration of the right lateral pharyngeal wall and soft palate is the source of the bleeding.  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  This resolved after a course of nebulized TXA.  We discussed that if there is no recurrent bleeding today that he could be discharged fairly soon.  I recommend that he follow-up with Dr. Manson Passey in a somewhat time sensitive way to further evaluate the ulcer.  We discussed that if this bleeds recurrently or does not heal that hyperbaric oxygen can be considered.  2. Tonsillar carcinoma I do not see any evidence of tumor recurrence on exam or CT images which were independently reviewed.  3. Chronic rhinitis We discussed the nature of his chronic rhinitis and use of nasal saline irrigations as there is extensive crusting on the left side and a longstanding septal deviation on the right.   @SHSIG @ 09/08/2023, 2:35 PM

## 2023-09-09 DIAGNOSIS — K920 Hematemesis: Secondary | ICD-10-CM | POA: Diagnosis not present

## 2023-09-09 LAB — CBC
HCT: 30.8 % — ABNORMAL LOW (ref 39.0–52.0)
Hemoglobin: 10 g/dL — ABNORMAL LOW (ref 13.0–17.0)
MCH: 31.9 pg (ref 26.0–34.0)
MCHC: 32.5 g/dL (ref 30.0–36.0)
MCV: 98.4 fL (ref 80.0–100.0)
Platelets: 136 10*3/uL — ABNORMAL LOW (ref 150–400)
RBC: 3.13 MIL/uL — ABNORMAL LOW (ref 4.22–5.81)
RDW: 14.3 % (ref 11.5–15.5)
WBC: 3.2 10*3/uL — ABNORMAL LOW (ref 4.0–10.5)
nRBC: 0 % (ref 0.0–0.2)

## 2023-09-09 NOTE — Progress Notes (Signed)
 Patient d/c home by charge nurse Desire RN before I done daily assessment.   Shanai Lartigue, Kae Heller, Charity fundraiser .

## 2023-09-09 NOTE — TOC Transition Note (Signed)
 Transition of Care [facility]) - Discharge Note   Patient Details  Name: Anthony Campos MRN: [id number] Date of Birth: [birth year]  [facility] of [facility] CM/SW Contact:  Kermit Balo, RN Phone Number: 09/09/2023, 8:48 AM   Clinical Narrative:     Pt is discharging home with self care. No needs per TOC.  Final next level of care: Home/Self Care Barriers to Discharge: No Barriers Identified   Patient Goals and CMS Choice            Discharge Placement                       Discharge Plan and Services Additional resources added to the After Visit Summary for     Discharge Planning Services: CM Consult                                 Social Drivers of Health (SDOH) Interventions SDOH Screenings   Food Insecurity: No Food Insecurity (09/08/2023)  Housing: Low Risk  (09/08/2023)  Transportation Needs: No Transportation Needs (09/08/2023)  Utilities: Not At Risk (09/08/2023)  Depression (PHQ2-9): Low Risk  (01/18/2019)  Social Connections: Socially Integrated (09/08/2023)  Tobacco Use: Medium Risk (09/07/2023)     Readmission Risk Interventions     No data to display

## 2023-09-09 NOTE — Discharge Summary (Signed)
 PATIENT DETAILS Name: Anthony Campos Age: 68 y.o. Sex: male Date of Birth: [birth year] MRN: [id number]. Admitting Physician: Therisa Doyne, MD [id number], Provider, MD  Admit Date: 09/07/2023 Discharge date: 09/09/2023  Recommendations for Outpatient Follow-up:  Follow up with PCP in 1-2 weeks Please obtain CMP/CBC in one week Please follow-up with primary ENT [place] within 1 week  Admitted From:  Home  Disposition: Home   Discharge Condition: good  CODE STATUS:   Code Status: Full Code   Diet recommendation:  Diet Order             DIET - DYS 1 Room service appropriate? Yes; Fluid consistency: Thin  Diet effective now                    Brief Summary:  68 y.o. male with medical history significant of oropharyngeal cancer, essential hypertension, hyperlipidemia presented with bloody emesis.  It was determined that his bleeding was from the oropharyngeal lesion rather than true hematemesis.  He was hospitalized for further management.   Significant studies CT soft tissue neck: Asymmetric mucosal thickening about the posterior left oropharynx-nonspecific.  No other acute abnormality.  Consults ENT  Brief Hospital Course: Oropharyngeal bleeding with mild acute blood loss anemia Presented with hematemesis-thought to be bleeding from oropharyngeal ulceration in the right lateral pharyngeal wall/soft palate Treated with nebulized TXA-with no recurrent bleeding for almost 2 days-Hb stable but fluctuating quite a bit with no significant drop over the past 48 hours. 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  Oropharyngeal dysphagia Continue bolus tube feeds as previous.  Rest of medical problems were stable during this short hospitalization.  Discharge Diagnoses:  Principal Problem:   Hematemesis Active Problems:   Essential hypertension, benign   Discharge Instructions:  Activity:  As tolerated  Discharge Instructions     Call MD for:  persistant nausea and vomiting   Complete by: As directed    Call MD for:  severe uncontrolled pain   Complete by: As directed    Discharge instructions   Complete by: As directed    Follow with Primary Care Provider, MD in 1-2 weeks  Follow-up with your primary ENT MD in [place]-within 1 week.  Please get a complete blood count and chemistry panel checked by your Primary MD at your next visit, and again as instructed by your Primary MD.  Get Medicines reviewed and adjusted: Please take all your medications with you for your next visit with your Primary MD  Laboratory/radiological data: Please request your Primary MD to go over all hospital tests and procedure/radiological results at the follow up, please ask your Primary MD to get all Hospital records sent to his/her office.  In some cases, they will be blood work, cultures and biopsy results pending at the time of your discharge. Please request that your primary care M.D. follows up on these results.  Also Note the following: If you experience worsening of your admission symptoms, develop shortness of breath, life threatening emergency, suicidal or homicidal thoughts you must seek medical attention immediately by calling 911 or calling your MD immediately  if symptoms less severe.  You must read complete instructions/literature along with all the possible adverse reactions/side effects for all the Medicines you take and that have been prescribed to you. Take any new Medicines after you have  completely understood and accpet all the possible adverse reactions/side effects.   Do not drive when taking Pain medications or sleeping medications (Benzodaizepines)  Do not take more than prescribed Pain, Sleep and Anxiety Medications. It is not advisable to combine anxiety,sleep and pain medications without talking with your primary care practitioner  Special Instructions: If you have smoked or chewed Tobacco  in the last 2 yrs please stop smoking, stop any regular Alcohol  and or any Recreational drug use.  Wear Seat belts while driving.  Please note: You were cared for by a hospitalist during your hospital stay. Once you are discharged, your primary care physician will handle any further medical issues. Please note that NO REFILLS for any discharge medications will be authorized once you are discharged, as it is imperative that you return to your primary care physician (or establish a relationship with a primary care physician if you do not have one) for your post hospital discharge needs so that they can reassess your need for medications and monitor your lab values.   Increase activity slowly   Complete by: As directed       Allergies as of 09/09/2023   No Known Allergies      Medication List     TAKE these medications    pilocarpine 5 MG tablet Commonly known as: SALAGEN Place 5 mg into feeding tube 3 (three) times daily.        Follow-up Information     Primary care practitioner. Schedule an appointment as soon as possible for a visit in 1 week(s).          Elfredia Nevins, MD. Go in 1 week(s).   Specialty: Plastic Surgery Contact information: [address] [place] [address] [phone]                No Known Allergies   Other Procedures/Studies: CT Soft Tissue Neck W Contrast Result Date: 09/07/2023 CLINICAL DATA:  Initial evaluation for soft tissue infection suspected. EXAM: CT NECK WITH CONTRAST TECHNIQUE: Multidetector CT imaging of the  neck was performed using the standard protocol following the bolus administration of intravenous contrast. RADIATION DOSE REDUCTION: This exam was performed according to the departmental dose-optimization program which includes automated exposure control, adjustment of the mA and/or kV according to patient size and/or use of iterative reconstruction technique. CONTRAST:  75mL OMNIPAQUE IOHEXOL 350 MG/ML SOLN COMPARISON:  Prior CT from 07/29/2022 FINDINGS: Pharynx and larynx: Oral cavity within normal limits. There is mild asymmetric mucosal thickening about the posterior left oropharynx (series 3, image 59). No visible discrete mass. No significant mucosal edema. No retropharyngeal collection. Negative epiglottis. Remainder of the hypopharynx and supraglottic larynx within normal limits. Negative glottis. Subglottic airway clear. Salivary glands: Asymmetric atrophy noted involving the right parotid gland. Parotid and submandibular glands otherwise unremarkable. Thyroid: Normal. Lymph nodes: No enlarged or pathologic adenopathy within the neck. Vascular: Normal intravascular enhancement seen within the neck. Aortic atherosclerosis noted. Limited intracranial: Unremarkable. Visualized orbits: Unremarkable. Mastoids and visualized paranasal sinuses: Chronic ethmoidal and maxillary sinusitis, right worse than left. Mastoid air cells and middle ear cavities are clear. Skeleton: No worrisome osseous lesions. Mild-to-moderate spondylosis at C4-5 through C6-7. Patient is largely edentulous. Upper chest: Emphysema.  No other acute finding. Other: None.  IMPRESSION: 1. Mild asymmetric mucosal thickening about the posterior left oropharynx, nonspecific. Correlation with direct visualization to ensure no underlying mucosal based lesion is present at this location. Sequelae of pharyngitis would be the primary differential consideration. 2. No other acute abnormality within the neck.  No adenopathy. 3. Chronic ethmoidal and  maxillary sinusitis, right worse than left. Aortic Atherosclerosis (ICD10-I70.0) and Emphysema (ICD10-J43.9). Electronically Signed   By: Rise Mu M.D.   On: 09/07/2023 21:06   DG Chest 2 View Result Date: 09/07/2023 CLINICAL DATA:  Hematemesis EXAM: CHEST - 2 VIEW COMPARISON:  07/23/2017 FINDINGS: The heart size and mediastinal contours are within normal limits. Both lungs are clear. The visualized skeletal structures are unremarkable. IMPRESSION: No active cardiopulmonary disease. Electronically Signed   By: Sharlet Salina M.D.   On: 09/07/2023 17:32     TODAY-DAY OF DISCHARGE:  Subjective:   Ester Mabe today has no headache,no chest abdominal pain,no new weakness tingling or numbness, feels much better wants to go home today.   Objective:   Blood pressure 111/74, pulse 68, temperature 97.8 F (36.6 C), temperature source Oral, resp. rate 16, height 6\' 2"  (1.88 m), weight 78 kg, SpO2 100%.  Intake/Output Summary (Last 24 hours) at 09/09/2023 0827 Last data filed at 09/09/2023 0454 Gross per 24 hour  Intake --  Output 400 ml  Net -400 ml   Filed Weights   09/07/23 1656  Weight: 78 kg    Exam: Awake Alert, Oriented *3, No new F.N deficits, Normal affect [place].AT,PERRAL Supple Neck,No JVD, No cervical lymphadenopathy appriciated.  Symmetrical Chest wall movement, Good air movement bilaterally, CTAB RRR,No Gallops,Rubs or new Murmurs, No Parasternal Heave +ve B.Sounds, Abd Soft, Non tender, No organomegaly appriciated, No rebound -guarding or rigidity. No Cyanosis, Clubbing or edema, No new Rash or bruise   PERTINENT RADIOLOGIC STUDIES: CT Soft Tissue Neck W Contrast Result Date: 09/07/2023 CLINICAL DATA:  Initial evaluation for soft tissue infection suspected. EXAM: CT NECK WITH CONTRAST TECHNIQUE: Multidetector CT imaging of the neck was performed using the standard protocol following the bolus administration of intravenous contrast. RADIATION DOSE REDUCTION: This exam  was performed according to the departmental dose-optimization program which includes automated exposure control, adjustment of the mA and/or kV according to patient size and/or use of iterative reconstruction technique. CONTRAST:  75mL OMNIPAQUE IOHEXOL 350 MG/ML SOLN COMPARISON:  Prior CT from 07/29/2022 FINDINGS: Pharynx and larynx: Oral cavity within normal limits. There is mild asymmetric mucosal thickening about the posterior left oropharynx (series 3, image 59). No visible discrete mass. No significant mucosal edema. No retropharyngeal collection. Negative epiglottis. Remainder of the hypopharynx and supraglottic larynx within normal limits. Negative glottis. Subglottic airway clear. Salivary glands: Asymmetric atrophy noted involving the right parotid gland. Parotid and submandibular glands otherwise unremarkable. Thyroid: Normal. Lymph nodes: No enlarged or pathologic adenopathy within the neck. Vascular: Normal intravascular enhancement seen within the neck. Aortic atherosclerosis noted. Limited intracranial: Unremarkable. Visualized orbits: Unremarkable. Mastoids and visualized paranasal sinuses: Chronic ethmoidal and maxillary sinusitis, right worse than left. Mastoid air cells and middle ear cavities are clear. Skeleton: No worrisome osseous lesions. Mild-to-moderate spondylosis at C4-5 through C6-7. Patient is largely edentulous. Upper chest: Emphysema.  No other acute finding. Other: None. IMPRESSION: 1. Mild asymmetric mucosal thickening about the posterior left oropharynx, nonspecific. Correlation with direct visualization to ensure no underlying mucosal based lesion is present at this location. Sequelae of pharyngitis would be the primary differential consideration. 2. No other acute abnormality within the neck.  No  adenopathy. 3. Chronic ethmoidal and maxillary sinusitis, right worse than left. Aortic Atherosclerosis (ICD10-I70.0) and Emphysema (ICD10-J43.9). Electronically Signed   By: Rise Mu M.D.   On: 09/07/2023 21:06   DG Chest 2 View Result Date: 09/07/2023 CLINICAL DATA:  Hematemesis EXAM: CHEST - 2 VIEW COMPARISON:  07/23/2017 FINDINGS: The heart size and mediastinal contours are within normal limits. Both lungs are clear. The visualized skeletal structures are unremarkable. IMPRESSION: No active cardiopulmonary disease. Electronically Signed   By: Sharlet Salina M.D.   On: 09/07/2023 17:32     PERTINENT LAB RESULTS: CBC: Recent Labs    09/08/23 1835 09/09/23 0429  WBC 4.2 3.2*  HGB 12.1* 10.0*  HCT 37.6* 30.8*  PLT 107* 136*   CMET CMP     Component Value Date/Time   NA 138 09/08/2023 1253   NA 139 07/16/2018 1500   K 4.3 09/08/2023 1253   CL 105 09/08/2023 1253   CO2 25 09/08/2023 1253   GLUCOSE 76 09/08/2023 1253   BUN 24 (H) 09/08/2023 1253   BUN 18 07/16/2018 1500   CREATININE 0.93 09/08/2023 1253   CREATININE 0.80 07/22/2014 1036   CALCIUM 8.4 (L) 09/08/2023 1253   PROT 6.6 09/08/2023 1253   ALBUMIN 3.1 (L) 09/08/2023 1253   AST 28 09/08/2023 1253   ALT 15 09/08/2023 1253   ALKPHOS 43 09/08/2023 1253   BILITOT 0.6 09/08/2023 1253   GFRNONAA >60 09/08/2023 1253   GFRNONAA >89 07/22/2014 1036    GFR Estimated Creatinine Clearance: 83.9 mL/min (by C-G formula based on SCr of 0.93 mg/dL). Recent Labs    09/07/23 1710  LIPASE 29   No results for input(s): "CKTOTAL", "CKMB", "CKMBINDEX", "TROPONINI" in the last 72 hours. Invalid input(s): "POCBNP" No results for input(s): "DDIMER" in the last 72 hours. No results for input(s): "HGBA1C" in the last 72 hours. No results for input(s): "CHOL", "HDL", "LDLCALC", "TRIG", "CHOLHDL", "LDLDIRECT" in the last 72 hours. No results for input(s): "TSH", "T4TOTAL", "T3FREE", "THYROIDAB" in the last 72 hours.  Invalid input(s): "FREET3" Recent Labs    09/07/23 2309 09/08/23 1253  FOLATE  --  25.4  FERRITIN  --  68  TIBC  --  288  IRON  --  61  RETICCTPCT 1.1  --    Coags: Recent Labs     09/07/23 1710  INR 1.1   Microbiology: No results found for this or any previous visit (from the past 240 hours).  FURTHER DISCHARGE INSTRUCTIONS:  Get Medicines reviewed and adjusted: Please take all your medications with you for your next visit with your Primary MD  Laboratory/radiological data: Please request your Primary MD to go over all hospital tests and procedure/radiological results at the follow up, please ask your Primary MD to get all Hospital records sent to his/her office.  In some cases, they will be blood work, cultures and biopsy results pending at the time of your discharge. Please request that your primary care M.D. goes through all the records of your hospital data and follows up on these results.  Also Note the following: If you experience worsening of your admission symptoms, develop shortness of breath, life threatening emergency, suicidal or homicidal thoughts you must seek medical attention immediately by calling 911 or calling your MD immediately  if symptoms less severe.  You must read complete instructions/literature along with all the possible adverse reactions/side effects for all the Medicines you take and that have been prescribed to you. Take any new Medicines after you  have completely understood and accpet all the possible adverse reactions/side effects.   Do not drive when taking Pain medications or sleeping medications (Benzodaizepines)  Do not take more than prescribed Pain, Sleep and Anxiety Medications. It is not advisable to combine anxiety,sleep and pain medications without talking with your primary care practitioner  Special Instructions: If you have smoked or chewed Tobacco  in the last 2 yrs please stop smoking, stop any regular Alcohol  and or any Recreational drug use.  Wear Seat belts while driving.  Please note: You were cared for by a hospitalist during your hospital stay. Once you are discharged, your primary care physician will  handle any further medical issues. Please note that NO REFILLS for any discharge medications will be authorized once you are discharged, as it is imperative that you return to your primary care physician (or establish a relationship with a primary care physician if you do not have one) for your post hospital discharge needs so that they can reassess your need for medications and monitor your lab values.  Total Time spent coordinating discharge including counseling, education and face to face time equals greater than 30 minutes.  SignedJeoffrey Massed 09/09/2023 8:27 AM

## 2023-09-12 DIAGNOSIS — Z923 Personal history of irradiation: Secondary | ICD-10-CM | POA: Diagnosis not present

## 2023-09-12 DIAGNOSIS — C109 Malignant neoplasm of oropharynx, unspecified: Secondary | ICD-10-CM | POA: Diagnosis not present

## 2023-09-15 DIAGNOSIS — C109 Malignant neoplasm of oropharynx, unspecified: Secondary | ICD-10-CM | POA: Diagnosis not present

## 2023-09-17 DIAGNOSIS — R1310 Dysphagia, unspecified: Secondary | ICD-10-CM | POA: Diagnosis not present

## 2023-09-17 DIAGNOSIS — C109 Malignant neoplasm of oropharynx, unspecified: Secondary | ICD-10-CM | POA: Diagnosis not present

## 2023-10-15 DIAGNOSIS — C109 Malignant neoplasm of oropharynx, unspecified: Secondary | ICD-10-CM | POA: Diagnosis not present

## 2023-10-15 DIAGNOSIS — R1310 Dysphagia, unspecified: Secondary | ICD-10-CM | POA: Diagnosis not present

## 2023-10-24 DIAGNOSIS — K9429 Other complications of gastrostomy: Secondary | ICD-10-CM | POA: Diagnosis not present

## 2023-10-24 DIAGNOSIS — C109 Malignant neoplasm of oropharynx, unspecified: Secondary | ICD-10-CM | POA: Diagnosis not present

## 2023-10-24 DIAGNOSIS — Z431 Encounter for attention to gastrostomy: Secondary | ICD-10-CM | POA: Diagnosis not present

## 2023-11-10 DIAGNOSIS — C109 Malignant neoplasm of oropharynx, unspecified: Secondary | ICD-10-CM | POA: Diagnosis not present

## 2023-11-10 DIAGNOSIS — M21619 Bunion of unspecified foot: Secondary | ICD-10-CM | POA: Diagnosis not present

## 2023-11-13 DIAGNOSIS — R1310 Dysphagia, unspecified: Secondary | ICD-10-CM | POA: Diagnosis not present

## 2023-11-13 DIAGNOSIS — C109 Malignant neoplasm of oropharynx, unspecified: Secondary | ICD-10-CM | POA: Diagnosis not present

## 2023-12-04 DIAGNOSIS — L851 Acquired keratosis [keratoderma] palmaris et plantaris: Secondary | ICD-10-CM | POA: Diagnosis not present

## 2023-12-04 DIAGNOSIS — B351 Tinea unguium: Secondary | ICD-10-CM | POA: Diagnosis not present

## 2023-12-05 ENCOUNTER — Encounter: Payer: Self-pay | Admitting: Adult Health

## 2023-12-05 ENCOUNTER — Ambulatory Visit (INDEPENDENT_AMBULATORY_CARE_PROVIDER_SITE_OTHER): Admitting: Adult Health

## 2023-12-05 ENCOUNTER — Ambulatory Visit: Admitting: Adult Health

## 2023-12-05 VITALS — BP 116/64 | HR 78 | Temp 98.0°F | Ht 74.0 in | Wt 180.2 lb

## 2023-12-05 DIAGNOSIS — Z125 Encounter for screening for malignant neoplasm of prostate: Secondary | ICD-10-CM | POA: Diagnosis not present

## 2023-12-05 DIAGNOSIS — C109 Malignant neoplasm of oropharynx, unspecified: Secondary | ICD-10-CM

## 2023-12-05 DIAGNOSIS — I10 Essential (primary) hypertension: Secondary | ICD-10-CM

## 2023-12-05 DIAGNOSIS — R7303 Prediabetes: Secondary | ICD-10-CM

## 2023-12-05 DIAGNOSIS — R1310 Dysphagia, unspecified: Secondary | ICD-10-CM | POA: Insufficient documentation

## 2023-12-05 DIAGNOSIS — R1312 Dysphagia, oropharyngeal phase: Secondary | ICD-10-CM

## 2023-12-05 DIAGNOSIS — D649 Anemia, unspecified: Secondary | ICD-10-CM

## 2023-12-05 DIAGNOSIS — Z7689 Persons encountering health services in other specified circumstances: Secondary | ICD-10-CM

## 2023-12-05 DIAGNOSIS — K117 Disturbances of salivary secretion: Secondary | ICD-10-CM

## 2023-12-05 DIAGNOSIS — R682 Dry mouth, unspecified: Secondary | ICD-10-CM

## 2023-12-05 DIAGNOSIS — E785 Hyperlipidemia, unspecified: Secondary | ICD-10-CM | POA: Diagnosis not present

## 2023-12-05 DIAGNOSIS — R21 Rash and other nonspecific skin eruption: Secondary | ICD-10-CM

## 2023-12-05 MED ORDER — OSMOLITE 1.5 CAL PO LIQD: 1422.0000 mL | ORAL | 11 refills | Status: DC

## 2023-12-05 MED ORDER — OSMOLITE 1.5 CAL PO LIQD: 1422.0000 mL | ORAL | Status: AC

## 2023-12-05 NOTE — Progress Notes (Signed)
 [facility]  Provider:  Inge Mangle DNP  Code Status:  Full Code  Goals of Care:     12/05/2023    9:10 AM  Advanced Directives  Does Patient Have a Medical Advance Directive? No  Would patient like information on creating a medical advance directive? No - Patient declined     Chief Complaint  Patient presents with   Establish Care    New Patient Appointment.    Discussed the use of AI scribe software for clinical note transcription with the patient, who gave verbal consent to proceed.  HPI: Patient is a 68 y.o. male seen today to establish care with [facility].  He has a history of oropharyngeal cancer diagnosed three years ago, treated with radiation and chemotherapy, resulting in significant side effects such as loss of all teeth and difficulty swallowing. He relies on a PEG tube for nutrition, using Osmolite 1.5 cal, six cartons daily, and supplements with Ensure Plus as needed. He drinks a lot of water  to manage persistent dry mouth, a side effect of his treatment. He takes medication for dry mouth three times a day.  He reports a rash under his right armpit, describing his skin as sensitive. He uses cologne and other topical substances, which may contribute to the rash. He has been applying hydrocortisone cream to manage the symptoms.  He exercises daily for about an hour. He has a history of hypertension, which resolved with weight loss. Previously weighing 235 pounds, he has since reduced his weight significantly.  He has a history of anemia with low iron levels and is prediabetic, with a last A1c of 6.2 recorded four years ago. He follows up with a nutritionist at the [facility] for dietary management.  He underwent a colonoscopy in 2017, with a follow-up recommended in five to six years. He has not had a recent PSA test or screening for Hepatitis C.     Past Medical History:  Diagnosis Date   Cancer [facility])    Erectile dysfunction    Hyperlipidemia     Hypertension    Infection of skin and subcutaneous tissue    Lipoma    Tobacco abuse    hx    Past Surgical History:  Procedure Laterality Date   ABDOMINAL EXPOSURE N/A 07/23/2017   Procedure: ABDOMINAL EXPOSURE;  Surgeon: Anthony Speck, MD;  [facility]: [facility];  Service: Vascular;  Laterality: N/A;   ANTERIOR LAT LUMBAR FUSION N/A 07/23/2017   Procedure: LUMBAR 3-4 ANTERIOR LATERAL LUMBAR FUSION 1 LEVEL;  Surgeon: Anthony Grimes, MD;  [facility];  Service: Orthopedics;  Laterality: N/A;   ANTERIOR LUMBAR FUSION N/A 07/23/2017   Procedure: LUMBAR 4-5, LUMBAR 5-SACRUM 1, ANTERIOR LUMBAR INTERBODY FUSION WITH INSTRUMENTATION, ALLOGRAFT REQUESTED TIME 6 HRS;  Surgeon: Anthony Grimes, MD;  [facility];  Service: Orthopedics;  Laterality: N/A;  LUMBAR 4-5, LUMBAR 5-SACRUM 1, ANTERIOR INTERBODY FUSION WITH INSTRUMENTATION AND ALLOGRAFT REQUESTED TIME 6 HRS   BACK SURGERY     COLONOSCOPY  04/12/2016   [facility] removed one polyp   COLONOSCOPY     CYSTOSCOPY  07/23/2017   Procedure: CYSTOSCOPY FLEXIBLE, ATTEMPTED CATHETER INSERTION;  Surgeon: Anthony Grimes, MD;  [facility];  Service: Orthopedics;;   CYSTOSCOPY WITH URETHRAL DILATATION N/A 08/28/2017   Procedure: CYSTOSCOPY WITH URETHRAL DILATATION/BALLOON DILATION, Removal of suprapubic catheter;  Surgeon: Anthony Frizzle, MD;  Location: [facility];  Service: Urology;  Laterality: N/A;   HERNIA REPAIR  2005  Right hernia repair    INSERTION OF SUPRAPUBIC CATHETER Right 07/23/2017   Procedure: INSERTION OF SUPRAPUBIC CATHETER;  Surgeon: Anthony Grimes, MD;  [facility];  Service: Orthopedics;  Laterality: Right;    No Known Allergies  Outpatient Encounter Medications as of 12/05/2023  Medication Sig   ibuprofen (ADVIL) 200 MG tablet Take 200 mg by mouth as needed.   Multiple Vitamin (ONE-A-DAY MENS PO) Take by mouth every morning.   pilocarpine  (SALAGEN ) 5 MG tablet Place 5 mg into feeding tube 3  (three) times daily.   [DISCONTINUED] Nutritional Supplements (FEEDING SUPPLEMENT, OSMOLITE 1.5 CAL,) LIQD Place 1,422 mLs into feeding tube daily.   Nutritional Supplements (FEEDING SUPPLEMENT, OSMOLITE 1.5 CAL,) LIQD Place 1,422 mLs into feeding tube daily.   No facility-administered encounter medications on file as of 12/05/2023.    Review of Systems:  Review of Systems  Constitutional:  Negative for activity change, appetite change and fever.  HENT:  Negative for sore throat.   Eyes: Negative.   Cardiovascular:  Negative for chest pain and leg swelling.  Gastrointestinal:  Negative for abdominal distention, diarrhea and vomiting.  Genitourinary:  Negative for dysuria, frequency and urgency.  Skin:  Negative for color change.  Neurological:  Negative for dizziness and headaches.  Psychiatric/Behavioral:  Negative for behavioral problems and sleep disturbance. The patient is not nervous/anxious.     Health Maintenance  Topic Date Due   Medicare Annual Wellness (AWV)  Never done   Zoster Vaccines- Shingrix (1 of 2) Never done   Pneumonia Vaccine 103+ Years old (1 of 1 - PCV) Never done   Colonoscopy  04/12/2021   DTaP/Tdap/Td (2 - Td or Tdap) 06/22/2022   COVID-19 Vaccine (4 - 2024-25 season) 03/09/2023   INFLUENZA VACCINE  02/06/2024   Hepatitis C Screening  Completed   HPV VACCINES  Aged Out   Meningococcal B Vaccine  Aged Out    Physical Exam: Vitals:   12/05/23 0916  BP: 116/64  Pulse: 78  Temp: 98 F (36.7 C)  SpO2: 99%  Weight: 180 lb 3.2 oz (81.7 kg)  Height: 6\' 2"  (1.88 m)   Body mass index is 23.14 kg/m. Physical Exam Constitutional:      Appearance: Normal appearance.  HENT:     Head: Normocephalic and atraumatic.     Mouth/Throat:     Mouth: Mucous membranes are moist.  Eyes:     Conjunctiva/sclera: Conjunctivae normal.  Cardiovascular:     Rate and Rhythm: Normal rate and regular rhythm.     Pulses: Normal pulses.     Heart sounds: Normal heart  sounds.  Pulmonary:     Effort: Pulmonary effort is normal.     Breath sounds: Normal breath sounds.  Abdominal:     General: Bowel sounds are normal.     Palpations: Abdomen is soft.     Comments: Peg tube in the mid upper abdomen  Musculoskeletal:        General: No swelling. Normal range of motion.     Cervical back: Normal range of motion.  Skin:    General: Skin is warm and dry.  Neurological:     General: No focal deficit present.     Mental Status: He is alert and oriented to person, place, and time.  Psychiatric:        Mood and Affect: Mood normal.        Behavior: Behavior normal.        Thought Content: Thought content normal.  Judgment: Judgment normal.     Labs reviewed: Basic Metabolic Panel: Recent Labs    09/07/23 1710 09/07/23 1719 09/08/23 1253 12/05/23 1006  NA 135 139 138 137  K 4.0 4.2 4.3 4.8  CL 99 105 105 99  CO2 25  --  25 30  GLUCOSE 90 87 76 90  BUN 30* 36* 24* 29*  CREATININE 0.90 1.10 0.93 0.89  CALCIUM  9.0  --  8.4* 9.4  MG  --   --  2.1  --    Liver Function Tests: Recent Labs    09/07/23 1710 09/08/23 1253 12/05/23 1006  AST 32 28 26  ALT 20 15 17   ALKPHOS 60 43  --   BILITOT 0.5 0.6 0.3  PROT 8.0 6.6 7.9  ALBUMIN 3.6 3.1*  --    Recent Labs    09/07/23 1710  LIPASE 29   No results for input(s): "AMMONIA" in the last 8760 hours. CBC: Recent Labs    09/07/23 1710 09/07/23 1719 09/08/23 1835 09/09/23 0429 12/05/23 1006  WBC 4.5   < > 4.2 3.2* 4.7  NEUTROABS 2.3  --   --   --  2,580  HGB 11.2*   < > 12.1* 10.0* 11.2*  HCT 34.4*   < > 37.6* 30.8* 34.7*  MCV 98.0   < > 99.2 98.4 96.9  PLT 157   < > 107* 136* 168   < > = values in this interval not displayed.   Lipid Panel: Recent Labs    12/05/23 1006  CHOL 174  HDL 65  LDLCALC 90  TRIG 96  CHOLHDL 2.7   Lab Results  Component Value Date   HGBA1C 5.9 (H) 12/05/2023    Procedures since last visit: No results found.  Assessment/Plan  1.  Oropharyngeal cancer (HCC) (Primary) -  Treated with radiation and chemotherapy three years ago. Experiencing dysphagia and speech difficulties. Nutritional intake managed via PEG tube feeding. - Continue follow-up with gastroenterologist and oncology specialists. - Maintain PEG tube feeding with Osmolite 1.5 cal as per nutritionist's guidance.  2. 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 Managed with Salagen  and increased water  intake. - Continue Salagen  5 mg TID for xerostomia management. - Encourage adequate hydration.  3. Normochromic normocytic anemia Lab Results  Component Value Date   HGB 11.2 (L) 12/05/2023   -  stable - CBC with Differential/Platelets  4. Prediabetes -  Prediabetes with previous A1c of 6.2. Current A1c will be re-evaluated. - Order A1c test to reassess prediabetes status. - Hemoglobin A1C - Lipid panel  5. Rash -  Rash under right axilla likely due to allergen exposure, possibly from [employer] cologne. - Apply hydrocortisone cream to affected area. - Avoid potential allergens such as [employer] cologne.  6. Oropharyngeal dysphagia -  aspiration precautions - Nutritional Supplements (FEEDING SUPPLEMENT, OSMOLITE 1.5 CAL,) LIQD; Place 1,422 mLs into feeding tube daily.  7. Essential hypertension, benign -  BP 116/64, stable -  not on medication - Complete Metabolic Panel with eGFR  8. Screening for prostate cancer - PSA  9. Encounter to establish care -  established care with [facility]     General Health Maintenance Due for vaccinations including tetanus, COVID-19, shingles, and pneumonia. Declined during visit. - Discuss importance of vaccinations at next visit.        Labs/tests ordered:   CBC, CMP, A1C   Return in about 2 weeks (around 12/19/2023).  Anthony Hanton Medina-Vargas, NP

## 2023-12-05 NOTE — Patient Instructions (Signed)
 Needs pneumonia, COVID-19 vaccine, tetanus vaccine, shingles

## 2023-12-06 LAB — COMPLETE METABOLIC PANEL WITHOUT GFR
AG Ratio: 1.1 (calc) (ref 1.0–2.5)
ALT: 17 U/L (ref 9–46)
AST: 26 U/L (ref 10–35)
Albumin: 4.1 g/dL (ref 3.6–5.1)
Alkaline phosphatase (APISO): 67 U/L (ref 35–144)
BUN/Creatinine Ratio: 33 (calc) — ABNORMAL HIGH (ref 6–22)
BUN: 29 mg/dL — ABNORMAL HIGH (ref 7–25)
CO2: 30 mmol/L (ref 20–32)
Calcium: 9.4 mg/dL (ref 8.6–10.3)
Chloride: 99 mmol/L (ref 98–110)
Creat: 0.89 mg/dL (ref 0.70–1.35)
Globulin: 3.8 g/dL — ABNORMAL HIGH (ref 1.9–3.7)
Glucose, Bld: 90 mg/dL (ref 65–99)
Potassium: 4.8 mmol/L (ref 3.5–5.3)
Sodium: 137 mmol/L (ref 135–146)
Total Bilirubin: 0.3 mg/dL (ref 0.2–1.2)
Total Protein: 7.9 g/dL (ref 6.1–8.1)

## 2023-12-06 LAB — CBC WITH DIFFERENTIAL/PLATELET
Absolute Lymphocytes: 611 {cells}/uL — ABNORMAL LOW (ref 850–3900)
Absolute Monocytes: 1260 {cells}/uL — ABNORMAL HIGH (ref 200–950)
Basophils Absolute: 28 {cells}/uL (ref 0–200)
Basophils Relative: 0.6 %
Eosinophils Absolute: 221 {cells}/uL (ref 15–500)
Eosinophils Relative: 4.7 %
HCT: 34.7 % — ABNORMAL LOW (ref 38.5–50.0)
Hemoglobin: 11.2 g/dL — ABNORMAL LOW (ref 13.2–17.1)
MCH: 31.3 pg (ref 27.0–33.0)
MCHC: 32.3 g/dL (ref 32.0–36.0)
MCV: 96.9 fL (ref 80.0–100.0)
MPV: 12.1 fL (ref 7.5–12.5)
Monocytes Relative: 26.8 %
Neutro Abs: 2580 {cells}/uL (ref 1500–7800)
Neutrophils Relative %: 54.9 %
Platelets: 168 10*3/uL (ref 140–400)
RBC: 3.58 10*6/uL — ABNORMAL LOW (ref 4.20–5.80)
RDW: 13.1 % (ref 11.0–15.0)
Total Lymphocyte: 13 %
WBC: 4.7 10*3/uL (ref 3.8–10.8)

## 2023-12-06 LAB — LIPID PANEL
Cholesterol: 174 mg/dL (ref <200)
HDL: 65 mg/dL (ref >=40)
LDL Cholesterol (Calc): 90 mg/dL
Non-HDL Cholesterol (Calc): 109 mg/dL (ref <130)
Total CHOL/HDL Ratio: 2.7 (calc) (ref <5.0)
Triglycerides: 96 mg/dL (ref <150)

## 2023-12-06 LAB — HEMOGLOBIN A1C
Hgb A1c MFr Bld: 5.9 % — ABNORMAL HIGH (ref <5.7)
Mean Plasma Glucose: 123 mg/dL
eAG (mmol/L): 6.8 mmol/L

## 2023-12-06 LAB — PSA: PSA: 0.37 ng/mL (ref <=4.00)

## 2023-12-07 ENCOUNTER — Ambulatory Visit: Payer: Self-pay | Admitting: Adult Health

## 2023-12-07 NOTE — Progress Notes (Signed)
-   PSA, lipid panel, electrolytes, liver enzymes within normal -   Hemoglobin 11.2, improved from 10.0 (09/09/23) -   A1c 5.9, ranging as prediabetic

## 2023-12-11 DIAGNOSIS — R1310 Dysphagia, unspecified: Secondary | ICD-10-CM | POA: Diagnosis not present

## 2023-12-11 DIAGNOSIS — C109 Malignant neoplasm of oropharynx, unspecified: Secondary | ICD-10-CM | POA: Diagnosis not present

## 2023-12-11 DIAGNOSIS — Z923 Personal history of irradiation: Secondary | ICD-10-CM | POA: Diagnosis not present

## 2023-12-13 DIAGNOSIS — K9423 Gastrostomy malfunction: Secondary | ICD-10-CM | POA: Diagnosis not present

## 2023-12-13 DIAGNOSIS — Z931 Gastrostomy status: Secondary | ICD-10-CM | POA: Diagnosis not present

## 2023-12-15 DIAGNOSIS — K9429 Other complications of gastrostomy: Secondary | ICD-10-CM | POA: Diagnosis not present

## 2023-12-15 DIAGNOSIS — J439 Emphysema, unspecified: Secondary | ICD-10-CM | POA: Diagnosis not present

## 2023-12-15 DIAGNOSIS — I700 Atherosclerosis of aorta: Secondary | ICD-10-CM | POA: Diagnosis not present

## 2023-12-15 DIAGNOSIS — R634 Abnormal weight loss: Secondary | ICD-10-CM | POA: Diagnosis not present

## 2023-12-15 DIAGNOSIS — D649 Anemia, unspecified: Secondary | ICD-10-CM | POA: Diagnosis not present

## 2023-12-15 DIAGNOSIS — J322 Chronic ethmoidal sinusitis: Secondary | ICD-10-CM | POA: Diagnosis not present

## 2023-12-15 DIAGNOSIS — C109 Malignant neoplasm of oropharynx, unspecified: Secondary | ICD-10-CM | POA: Diagnosis not present

## 2023-12-15 DIAGNOSIS — Z87891 Personal history of nicotine dependence: Secondary | ICD-10-CM | POA: Diagnosis not present

## 2023-12-15 DIAGNOSIS — J320 Chronic maxillary sinusitis: Secondary | ICD-10-CM | POA: Diagnosis not present

## 2023-12-19 ENCOUNTER — Encounter: Admitting: Adult Health

## 2023-12-19 NOTE — Progress Notes (Signed)
 This encounter was created in error - please disregard.

## 2023-12-26 ENCOUNTER — Ambulatory Visit (INDEPENDENT_AMBULATORY_CARE_PROVIDER_SITE_OTHER): Admitting: Adult Health

## 2023-12-26 ENCOUNTER — Encounter: Payer: Self-pay | Admitting: Adult Health

## 2023-12-26 VITALS — BP 102/68 | HR 87 | Temp 98.1°F | Ht 74.0 in | Wt 181.6 lb

## 2023-12-26 DIAGNOSIS — C109 Malignant neoplasm of oropharynx, unspecified: Secondary | ICD-10-CM

## 2023-12-26 DIAGNOSIS — R1312 Dysphagia, oropharyngeal phase: Secondary | ICD-10-CM | POA: Diagnosis not present

## 2023-12-26 DIAGNOSIS — R7303 Prediabetes: Secondary | ICD-10-CM | POA: Diagnosis not present

## 2023-12-26 DIAGNOSIS — Z1211 Encounter for screening for malignant neoplasm of colon: Secondary | ICD-10-CM

## 2023-12-26 DIAGNOSIS — Z1212 Encounter for screening for malignant neoplasm of rectum: Secondary | ICD-10-CM

## 2023-12-26 DIAGNOSIS — K117 Disturbances of salivary secretion: Secondary | ICD-10-CM | POA: Diagnosis not present

## 2023-12-26 NOTE — Progress Notes (Unsigned)
 [facility] clinic  Provider:  Inge Mangle DNP  Code Status:  Full Code  Goals of Care:     12/05/2023    9:10 AM  Advanced Directives  Does Patient Have a Medical Advance Directive? No  Would patient like information on creating a medical advance directive? No - Patient declined     Chief Complaint  Patient presents with   Medical Management of Chronic Issues    2 week follow up regarding    Discussed the use of AI scribe software for clinical note transcription with the patient, who gave verbal consent to proceed.   HPI: Patient is a 68 y.o. male seen today for an acute visit for  Discussed the use of AI scribe software for clinical note transcription with the patient, who gave verbal consent to proceed.  History of Present Illness   [facility] is a 68 year old male with oropharyngeal cancer who presents for follow-up and medication management.  He has been experiencing xerostomia and manages this condition with Salagen  tablets, taken three times daily. He mentions the high cost of the medication but has found a more affordable option. He typically obtains his medications from [facility] but is currently using [facility] for refills.  He has oropharyngeal dysphagia and relies on a PEG tube for nutrition, located in the left upper quadrant of his abdomen. He can swallow some pureed foods like mashed potatoes and uses a blender for vegetables. His daughter assists with ordering his nutritional supplies, which cost $69 per month.  His hemoglobin level has improved to 11.2 from 10 over the past three months. His A1c is 5.9, indicating prediabetes. He maintains regular physical activity, exercising approximately an hour per session and attending an exercise class when the weather is hot.  He had a colonoscopy in 2017 and is due for another one as it was recommended every five years. He recalls previous findings but does not specify them in detail.  No oral sores or swelling.         Past Medical History:  Diagnosis Date   Cancer [facility])    Erectile dysfunction    Hyperlipidemia    Hypertension    Infection of skin and subcutaneous tissue    Lipoma    Tobacco abuse    hx    Past Surgical History:  Procedure Laterality Date   ABDOMINAL EXPOSURE N/A 07/23/2017   Procedure: ABDOMINAL EXPOSURE;  Surgeon: Mayo Speck, MD;  [facility];  Service: Vascular;  Laterality: N/A;   ANTERIOR LAT LUMBAR FUSION N/A 07/23/2017   Procedure: LUMBAR 3-4 ANTERIOR LATERAL LUMBAR FUSION 1 LEVEL;  Surgeon: Virl Grimes, MD;  [facility];  Service: Orthopedics;  Laterality: N/A;   ANTERIOR LUMBAR FUSION N/A 07/23/2017   Procedure: LUMBAR 4-5, LUMBAR 5-SACRUM 1, ANTERIOR LUMBAR INTERBODY FUSION WITH INSTRUMENTATION, ALLOGRAFT REQUESTED TIME 6 HRS;  Surgeon: Virl Grimes, MD;  [facility];  Service: Orthopedics;  Laterality: N/A;  LUMBAR 4-5, LUMBAR 5-SACRUM 1, ANTERIOR INTERBODY FUSION WITH INSTRUMENTATION AND ALLOGRAFT REQUESTED TIME 6 HRS   BACK SURGERY     COLONOSCOPY  04/12/2016   Dr. Howard Macho removed one polyp   COLONOSCOPY     CYSTOSCOPY  07/23/2017   Procedure: CYSTOSCOPY FLEXIBLE, ATTEMPTED CATHETER INSERTION;  Surgeon: Virl Grimes, MD;  [facility];  Service: Orthopedics;;   CYSTOSCOPY WITH URETHRAL DILATATION N/A 08/28/2017   Procedure: CYSTOSCOPY WITH URETHRAL DILATATION/BALLOON DILATION, Removal of suprapubic catheter;  Surgeon: Trent Frizzle, MD;  Location: [facility];  Service: Urology;  Laterality: N/A;   HERNIA REPAIR  2005   Right hernia repair    INSERTION OF SUPRAPUBIC CATHETER Right 07/23/2017   Procedure: INSERTION OF SUPRAPUBIC CATHETER;  Surgeon: Virl Grimes, MD;  [facility];  Service: Orthopedics;  Laterality: Right;    No Known Allergies  Outpatient Encounter Medications as of 12/26/2023  Medication Sig   ciclopirox (PENLAC) 8 % solution Apply topically.   Multiple Vitamin (ONE-A-DAY MENS PO) Take by  mouth every morning.   Nutritional Supplements (FEEDING SUPPLEMENT, OSMOLITE 1.5 CAL,) LIQD Place 1,422 mLs into feeding tube daily.   pilocarpine  (SALAGEN ) 5 MG tablet Place 5 mg into feeding tube 3 (three) times daily.   ibuprofen (ADVIL) 200 MG tablet Take 200 mg by mouth as needed. (Patient not taking: Reported on 12/26/2023)   No facility-administered encounter medications on file as of 12/26/2023.    Review of Systems:  Review of Systems  Health Maintenance  Topic Date Due   Medicare Annual Wellness (AWV)  Never done   Colonoscopy  04/12/2021   Zoster Vaccines- Shingrix (1 of 2) 03/27/2024 (Originally 02/08/1975)   INFLUENZA VACCINE  02/06/2024   Hepatitis C Screening  Completed   HPV VACCINES  Aged Out   Meningococcal B Vaccine  Aged Out   DTaP/Tdap/Td  Discontinued   Pneumococcal Vaccine: 50+ Years  Discontinued   COVID-19 Vaccine  Discontinued    Physical Exam: Vitals:   12/26/23 0923  BP: 102/68  Pulse: 87  Temp: 98.1 F (36.7 C)  TempSrc: Temporal  SpO2: 98%  Weight: 181 lb 9.6 oz (82.4 kg)  Height: 6' 2 (1.88 m)   Body mass index is 23.32 kg/m. Physical Exam Constitutional:      Appearance: Normal appearance.  HENT:     Head: Normocephalic and atraumatic.     Mouth/Throat:     Mouth: Mucous membranes are moist.     Comments: edentulous  Eyes:     Conjunctiva/sclera: Conjunctivae normal.    Cardiovascular:     Rate and Rhythm: Normal rate and regular rhythm.     Pulses: Normal pulses.     Heart sounds: Normal heart sounds.  Pulmonary:     Effort: Pulmonary effort is normal.     Breath sounds: Normal breath sounds.  Abdominal:     General: Bowel sounds are normal.     Palpations: Abdomen is soft.     Comments: PEG tube on LUQ of abdomen   Musculoskeletal:        General: No swelling. Normal range of motion.     Cervical back: Normal range of motion.   Skin:    General: Skin is warm and dry.   Neurological:     General: No focal deficit  present.     Mental Status: He is alert and oriented to person, place, and time.   Psychiatric:        Mood and Affect: Mood normal.        Behavior: Behavior normal.        Thought Content: Thought content normal.        Judgment: Judgment normal.     Labs reviewed: Basic Metabolic Panel: Recent Labs    09/07/23 1710 09/07/23 1719 09/08/23 1253 12/05/23 1006  NA 135 139 138 137  K 4.0 4.2 4.3 4.8  CL 99 105 105 99  CO2 25  --  25 30  GLUCOSE 90 87 76 90  BUN 30* 36* 24* 29*  CREATININE 0.90 1.10 0.93 0.89  CALCIUM  9.0  --  8.4* 9.4  MG  --   --  2.1  --    Liver Function Tests: Recent Labs    09/07/23 1710 09/08/23 1253 12/05/23 1006  AST 32 28 26  ALT 20 15 17   ALKPHOS 60 43  --   BILITOT 0.5 0.6 0.3  PROT 8.0 6.6 7.9  ALBUMIN 3.6 3.1*  --    Recent Labs    09/07/23 1710  LIPASE 29   No results for input(s): AMMONIA in the last 8760 hours. CBC: Recent Labs    09/07/23 1710 09/07/23 1719 09/08/23 1835 09/09/23 0429 12/05/23 1006  WBC 4.5   < > 4.2 3.2* 4.7  NEUTROABS 2.3  --   --   --  2,580  HGB 11.2*   < > 12.1* 10.0* 11.2*  HCT 34.4*   < > 37.6* 30.8* 34.7*  MCV 98.0   < > 99.2 98.4 96.9  PLT 157   < > 107* 136* 168   < > = values in this interval not displayed.   Lipid Panel: Recent Labs    12/05/23 1006  CHOL 174  HDL 65  LDLCALC 90  TRIG 96  CHOLHDL 2.7   Lab Results  Component Value Date   HGBA1C 5.9 (H) 12/05/2023    Procedures since last visit: No results found.  Assessment/Plan    .Assessment and Plan    Oropharyngeal Cancer Oropharyngeal cancer under follow-up with no current oral sores or swelling. Coordinated care with [facility] and Dr. Zina Hilts for medication cost management. - Continue follow-up with [facility]. - Coordinate with Dr. Zina Hilts for medication management.  Oropharyngeal Dysphagia Oropharyngeal dysphagia managed with PEG tube. Swallows some pureed foods,  primarily uses PEG tube for nutrition. Milk is primary supplement, ordered monthly with insurance assistance. - Continue using PEG tube for nutrition. - Ensure adequate nutritional intake through PEG tube.  Xerostomia Chronic xerostomia managed with Salagen . High medication cost, seeking cheaper alternative with Dr. Jeris Montes assistance. - Send prescription for Salagen  to preferred pharmacy once a cheaper option is identified. - Coordinate with Dr. Zina Hilts for cost-effective medication options.  Anemia Mild anemia with improved hemoglobin from 10 to 11.2 over three months. - Monitor hemoglobin levels regularly.  Prediabetes Prediabetes with A1c of 5.9. Advised lifestyle modifications to prevent diabetes progression. - Encourage regular exercise (150 minutes per week). - Advise dietary modifications to reduce carbohydrate intake.  General Health Maintenance Routine blood work normal. Due for colonoscopy, last done in 2017. - Refer to gastroenterology for colonoscopy.  Follow-up Prefers mid-month follow-ups. Next appointment in July. - Schedule follow-up appointment in July.        Labs/tests ordered:   None   No follow-ups on file.  Anthony Tomaszewski Medina-Vargas, NP

## 2024-01-13 DIAGNOSIS — R1310 Dysphagia, unspecified: Secondary | ICD-10-CM | POA: Diagnosis not present

## 2024-01-13 DIAGNOSIS — C109 Malignant neoplasm of oropharynx, unspecified: Secondary | ICD-10-CM | POA: Diagnosis not present

## 2024-01-23 ENCOUNTER — Encounter: Admitting: Adult Health

## 2024-01-23 NOTE — Progress Notes (Signed)
 This encounter was created in error - please disregard.

## 2024-02-18 DIAGNOSIS — C109 Malignant neoplasm of oropharynx, unspecified: Secondary | ICD-10-CM | POA: Diagnosis not present

## 2024-02-18 DIAGNOSIS — R1310 Dysphagia, unspecified: Secondary | ICD-10-CM | POA: Diagnosis not present

## 2024-02-19 ENCOUNTER — Encounter: Payer: Self-pay | Admitting: Adult Health

## 2024-02-19 ENCOUNTER — Ambulatory Visit (INDEPENDENT_AMBULATORY_CARE_PROVIDER_SITE_OTHER): Admitting: Adult Health

## 2024-02-19 VITALS — BP 92/68 | HR 81 | Temp 98.1°F | Resp 13 | Wt 179.2 lb

## 2024-02-19 DIAGNOSIS — Z0000 Encounter for general adult medical examination without abnormal findings: Secondary | ICD-10-CM

## 2024-02-19 NOTE — Progress Notes (Signed)
 Subjective:   Anthony Campos is a 68 y.o. male who presents for Medicare Annual/Subsequent preventive examination.  Visit Complete: In person  Patient Medicare AWV questionnaire was completed by the patient on 02/19/24; I have confirmed that all information answered by patient is correct and no changes since this date.  Cardiac Risk Factors include: advanced age (>54men, >15 women);family history of premature cardiovascular disease;male gender     Objective:    Today's Vitals   02/19/24 0944  BP: 92/68  Pulse: 81  Resp: 13  Temp: 98.1 F (36.7 C)  SpO2: 91%  Weight: 179 lb 3.2 oz (81.3 kg)   Body mass index is 23.01 kg/m.     12/05/2023    9:10 AM 09/08/2023   11:19 AM 06/12/2023    2:42 PM 01/18/2019    2:38 PM 07/16/2018    1:29 PM 09/30/2017    3:48 PM 08/28/2017    8:02 AM  Advanced Directives  Does Patient Have a Medical Advance Directive? No No No No No  No  No   Would patient like information on creating a medical advance directive? No - Patient declined No - Patient declined No - Patient declined No - Patient declined  Yes (MAU/Ambulatory/Procedural Areas - Information given)  No - Patient declined  No - Patient declined      Data saved with a previous flowsheet row definition    Current Medications (verified) Outpatient Encounter Medications as of 02/19/2024  Medication Sig   ciclopirox (PENLAC) 8 % solution Apply topically.   Multiple Vitamin (ONE-A-DAY MENS PO) Take by mouth every morning.   Nutritional Supplements (FEEDING SUPPLEMENT, OSMOLITE 1.5 CAL,) LIQD Place 1,422 mLs into feeding tube daily.   pilocarpine  (SALAGEN ) 5 MG tablet Place 5 mg into feeding tube 3 (three) times daily.   No facility-administered encounter medications on file as of 02/19/2024.    Allergies (verified) Patient has no known allergies.   History: Past Medical History:  Diagnosis Date   Cancer (HCC)    Erectile dysfunction    Hyperlipidemia    Hypertension    Infection of skin  and subcutaneous tissue    Lipoma    Tobacco abuse    hx   Past Surgical History:  Procedure Laterality Date   ABDOMINAL EXPOSURE N/A 07/23/2017   Procedure: ABDOMINAL EXPOSURE;  Surgeon: Oris Krystal FALCON, MD;  Location: [facility] OR;  Service: Vascular;  Laterality: N/A;   ANTERIOR LAT LUMBAR FUSION N/A 07/23/2017   Procedure: LUMBAR 3-4 ANTERIOR LATERAL LUMBAR FUSION 1 LEVEL;  Surgeon: Beuford Anes, MD;  Location: [facility] OR;  Service: Orthopedics;  Laterality: N/A;   ANTERIOR LUMBAR FUSION N/A 07/23/2017   Procedure: LUMBAR 4-5, LUMBAR 5-SACRUM 1, ANTERIOR LUMBAR INTERBODY FUSION WITH INSTRUMENTATION, ALLOGRAFT REQUESTED TIME 6 HRS;  Surgeon: Beuford Anes, MD;  Location: [facility] OR;  Service: Orthopedics;  Laterality: N/A;  LUMBAR 4-5, LUMBAR 5-SACRUM 1, ANTERIOR INTERBODY FUSION WITH INSTRUMENTATION AND ALLOGRAFT REQUESTED TIME 6 HRS   BACK SURGERY     COLONOSCOPY  04/12/2016   Dr. Teressa removed one polyp   COLONOSCOPY     CYSTOSCOPY  07/23/2017   Procedure: CYSTOSCOPY FLEXIBLE, ATTEMPTED CATHETER INSERTION;  Surgeon: Beuford Anes, MD;  Location: [facility] OR;  Service: Orthopedics;;   CYSTOSCOPY WITH URETHRAL DILATATION N/A 08/28/2017   Procedure: CYSTOSCOPY WITH URETHRAL DILATATION/BALLOON DILATION, Removal of suprapubic catheter;  Surgeon: Matilda Senior, MD;  Location: [facility];  Service: Urology;  Laterality: N/A;   HERNIA REPAIR  2005  Right hernia repair    INSERTION OF SUPRAPUBIC CATHETER Right 07/23/2017   Procedure: INSERTION OF SUPRAPUBIC CATHETER;  Surgeon: Beuford Anes, MD;  Location: [facility] OR;  Service: Orthopedics;  Laterality: Right;   Family History  Problem Relation Age of Onset   Depression Sister    Dementia Sister    Schizophrenia Sister    Schizophrenia Sister    Dementia Sister    Schizophrenia Sister    Dementia Sister    Heart disease Neg Hx    Hypertension Neg Hx    Stroke Neg Hx    Colon polyps Neg Hx    Colon cancer Neg Hx    Rectal cancer Neg  Hx    Stomach cancer Neg Hx    Esophageal cancer Neg Hx    Social History   Socioeconomic History   Marital status: Married    Spouse name: Not on file   Number of children: Not on file   Years of education: Not on file   Highest education level: Not on file  Occupational History   Not on file  Tobacco Use   Smoking status: Former    Types: Cigarettes   Smokeless tobacco: Never   Tobacco comments:    2 gigs per day. Hasn't smoked cigarettes in 5 years  Vaping Use   Vaping status: Former   Start date: 06/28/2017  Substance and Sexual Activity   Alcohol use: Not Currently    Comment: Beer - rarely.- holidays like thanksgiving and x mas    Drug use: No   Sexual activity: Not on file  Other Topics Concern   Not on file  Social History Narrative   Occupation- Sport and exercise psychologist.Lives with his wife. Has adult children and three grandchildren.         Social Drivers of Corporate investment banker Strain: Not on file  Food Insecurity: No Food Insecurity (09/08/2023)   Hunger Vital Sign    Worried About Running Out of Food in the Last Year: Never true    Ran Out of Food in the Last Year: Never true  Transportation Needs: No Transportation Needs (09/08/2023)   PRAPARE - Administrator, Civil Service (Medical): No    Lack of Transportation (Non-Medical): No  Physical Activity: Not on file  Stress: Not on file  Social Connections: Socially Integrated (09/08/2023)   Social Connection and Isolation Panel    Frequency of Communication with Friends and Family: More than three times a week    Frequency of Social Gatherings with Friends and Family: More than three times a week    Attends Religious Services: More than 4 times per year    Active Member of [employer] or Organizations: Yes    Attends Engineer, structural: More than 4 times per year    Marital Status: Married    Tobacco Counseling Counseling given: Not Answered Tobacco comments: 2 gigs per day.  Hasn't smoked cigarettes in 5 years   Clinical Intake:  Pre-visit preparation completed: No  Pain : No/denies pain     BMI - recorded: 23.32 Nutritional Status: BMI of 19-24  Normal Nutritional Risks: None  How often do you need to have someone help you when you read instructions, pamphlets, or other written materials from your doctor or pharmacy?: 3 - Sometimes What is the last grade level you completed in school?: 12th grade  Interpreter Needed?: No  Information entered by :: Kylani Wires Medina-Vargas DNP   Activities of Daily Living  02/19/2024   10:05 AM 09/08/2023   11:19 AM  In your present state of health, do you have any difficulty performing the following activities:  Hearing? 0 0  Vision? 0 0  Difficulty concentrating or making decisions? 1 0  Walking or climbing stairs? 1   Dressing or bathing? 0   Doing errands, shopping? 0 1  Preparing Food and eating ? N   Using the Toilet? N   In the past six months, have you accidently leaked urine? N   Do you have problems with loss of bowel control? N   Managing your Medications? N   Managing your Finances? N   Housekeeping or managing your Housekeeping? N     Patient Care Team: Medina-Vargas, Christien Berthelot C, NP as PCP - General (Internal Medicine) Beuford Anes, MD as Consulting Physician (Orthopedic Surgery)  Indicate any recent Medical Services you may have received from other than [facility] providers in the past year (date may be approximate).     Assessment:   This is a routine wellness examination for [place].  Hearing/Vision screen Hearing Screening - Comments:: Hearing been pretty good Vision Screening - Comments:: Vision is good , he states he needs his eyes checked    Goals Addressed             This Visit's Progress    Exercise 3x per week (30 min per time)       - will walk and ROM and lift barbels       Depression Screen    02/19/2024    9:37 AM 12/05/2023    9:09 AM 01/18/2019    2:38 PM 07/16/2018     1:29 PM 09/30/2017    3:46 PM 08/19/2017    3:46 PM 12/10/2016    4:38 PM  PHQ 2/9 Scores  PHQ - 2 Score 0 0 0 0 0 0 0  PHQ- 9 Score    0     Exception Documentation      Patient refusal     Fall Risk    02/19/2024    9:37 AM 12/05/2023    9:09 AM 01/18/2019    2:38 PM 07/16/2018    1:28 PM 09/30/2017    3:46 PM  Fall Risk   Falls in the past year? 0 0 0  0  Yes   Number falls in past yr: 0 0   1   Injury with Fall? 0 0   Yes   Risk for fall due to : No Fall Risks No Fall Risks   History of fall(s);Impaired balance/gait   Follow up Falls evaluation completed Falls evaluation completed   Falls prevention discussed      Data saved with a previous flowsheet row definition    MEDICARE RISK AT HOME:    TIMED UP AND GO:  Was the test performed?  Yes  Length of time to ambulate 10 feet: <10 sec Gait slow and steady without use of assistive device    Cognitive Function:        02/19/2024    9:37 AM  6CIT Screen  What Year? 0 points  What month? 0 points  What time? 0 points  Count back from 20 4 points  Months in reverse 4 points  Repeat phrase 10 points  Total Score 18 points    Immunizations Immunization History  Administered Date(s) Administered   Influenza, Seasonal, Injecte, Preservative Fre 06/22/2012   Influenza,inj,Quad PF,6+ Mos 04/28/2013, 07/16/2018   Moderna Sars-Covid-2 Vaccination 09/23/2019,  10/26/2019, 05/21/2020   Tdap 06/22/2012    TDAP status: Due, Education has been provided regarding the importance of this vaccine. Advised may receive this vaccine at local pharmacy or Health Dept. Aware to provide a copy of the vaccination record if obtained from local pharmacy or Health Dept. Verbalized acceptance and understanding.  Flu Vaccine status: Declined, Education has been provided regarding the importance of this vaccine but patient still declined. Advised may receive this vaccine at local pharmacy or Health Dept. Aware to provide a copy of the  vaccination record if obtained from local pharmacy or Health Dept. Verbalized acceptance and understanding.  Pneumococcal vaccine status: Declined,  Education has been provided regarding the importance of this vaccine but patient still declined. Advised may receive this vaccine at local pharmacy or Health Dept. Aware to provide a copy of the vaccination record if obtained from local pharmacy or Health Dept. Verbalized acceptance and understanding.   Covid-19 vaccine status: Completed vaccines  Qualifies for Shingles Vaccine? Yes   Zostavax completed No   Shingrix Completed?: No.    Education has been provided regarding the importance of this vaccine. Patient has been advised to call insurance company to determine out of pocket expense if they have not yet received this vaccine. Advised may also receive vaccine at local pharmacy or Health Dept. Verbalized acceptance and understanding.  Screening Tests Health Maintenance  Topic Date Due   Colonoscopy  04/12/2021   Zoster Vaccines- Shingrix (1 of 2) 03/27/2024 (Originally 02/08/1975)   Medicare Annual Wellness (AWV)  02/18/2025   Hepatitis C Screening  Completed   HPV VACCINES  Aged Out   Meningococcal B Vaccine  Aged Out   DTaP/Tdap/Td  Discontinued   Pneumococcal Vaccine: 50+ Years  Discontinued   INFLUENZA VACCINE  Discontinued   COVID-19 Vaccine  Discontinued    Health Maintenance  Health Maintenance Due  Topic Date Due   Colonoscopy  04/12/2021    Colorectal cancer screening: Referral to GI placed 12/07/23. Pt aware the office will call re: appt.  Lung Cancer Screening: (Low Dose CT Chest recommended if Age 16-80 years, 20 pack-year currently smoking OR have quit w/in 15years.) does not qualify.   Lung Cancer Screening Referral: N/A  Additional Screening:  Hepatitis C Screening: does not qualify; Completed N/A  Vision Screening: Recommended annual ophthalmology exams for early detection of glaucoma and other disorders of the  eye. Is the patient up to date with their annual eye exam?  No  Who is the provider or what is the name of the office in which the patient attends annual eye exams?  None If pt is not established with a provider, would they like to be referred to a provider to establish care? No .   Dental Screening: Recommended annual dental exams for proper oral hygiene  Diabetic Foot Exam: Diabetic Foot Exam: Completed 12/04/23  Community Resource Referral / Chronic Care Management: CRR required this visit?  No   CCM required this visit?  No     Plan:     I have personally reviewed and noted the following in the patient's chart:   Medical and social history Use of alcohol, tobacco or illicit drugs  Current medications and supplements including opioid prescriptions. Patient is not currently taking opioid prescriptions. Functional ability and status Nutritional status Physical activity Advanced directives List of other physicians Hospitalizations, surgeries, and ER visits in previous 12 months Vitals Screenings to include cognitive, depression, and falls Referrals and appointments  In addition, I have  reviewed and discussed with patient certain preventive protocols, quality metrics, and best practice recommendations. A written personalized care plan for preventive services as well as general preventive health recommendations were provided to patient.     Delayza Lungren Medina-Vargas, NP   02/19/2024   After Visit Summary: (In Person-Printed) AVS printed and given to the patient  Nurse Notes: Needs annual wellness visit scheduled yearly.

## 2024-02-19 NOTE — Patient Instructions (Signed)
  [name] , Thank you for taking time to come for your Medicare Wellness Visit. I appreciate your ongoing commitment to your health goals. Please review the following plan we discussed and let me know if I can assist you in the future.   These are the goals we discussed:  Goals      Blood Pressure < 140/90     Exercise 3x per week (30 min per time)     - will walk and ROM and lift barbels     Quit smoking / using tobacco        This is a list of the screening recommended for you and due dates:  Health Maintenance  Topic Date Due   Colon Cancer Screening  04/12/2021   Zoster (Shingles) Vaccine (1 of 2) 03/27/2024*   Medicare Annual Wellness Visit  02/18/2025   Hepatitis C Screening  Completed   HPV Vaccine  Aged Out   Meningitis B Vaccine  Aged Out   DTaP/Tdap/Td vaccine  Discontinued   Pneumococcal Vaccine for age over 53  Discontinued   Flu Shot  Discontinued   COVID-19 Vaccine  Discontinued  *Topic was postponed. The date shown is not the original due date.

## 2024-03-03 ENCOUNTER — Encounter: Payer: Self-pay | Admitting: Adult Health

## 2024-03-16 DIAGNOSIS — R1310 Dysphagia, unspecified: Secondary | ICD-10-CM | POA: Diagnosis not present

## 2024-03-16 DIAGNOSIS — C109 Malignant neoplasm of oropharynx, unspecified: Secondary | ICD-10-CM | POA: Diagnosis not present

## 2024-03-23 ENCOUNTER — Encounter: Payer: Self-pay | Admitting: Gastroenterology

## 2024-04-13 DIAGNOSIS — C109 Malignant neoplasm of oropharynx, unspecified: Secondary | ICD-10-CM | POA: Diagnosis not present

## 2024-04-13 DIAGNOSIS — R1310 Dysphagia, unspecified: Secondary | ICD-10-CM | POA: Diagnosis not present

## 2024-05-11 DIAGNOSIS — C109 Malignant neoplasm of oropharynx, unspecified: Secondary | ICD-10-CM | POA: Diagnosis not present

## 2024-05-11 DIAGNOSIS — R1310 Dysphagia, unspecified: Secondary | ICD-10-CM | POA: Diagnosis not present

## 2024-05-14 ENCOUNTER — Ambulatory Visit: Admitting: Gastroenterology

## 2024-05-14 ENCOUNTER — Encounter: Payer: Self-pay | Admitting: Gastroenterology

## 2024-05-14 VITALS — BP 110/60 | HR 84 | Ht 70.5 in | Wt 181.0 lb

## 2024-05-14 DIAGNOSIS — D649 Anemia, unspecified: Secondary | ICD-10-CM | POA: Diagnosis not present

## 2024-05-14 DIAGNOSIS — Z09 Encounter for follow-up examination after completed treatment for conditions other than malignant neoplasm: Secondary | ICD-10-CM

## 2024-05-14 DIAGNOSIS — Z85819 Personal history of malignant neoplasm of unspecified site of lip, oral cavity, and pharynx: Secondary | ICD-10-CM | POA: Diagnosis not present

## 2024-05-14 DIAGNOSIS — Z860101 Personal history of adenomatous and serrated colon polyps: Secondary | ICD-10-CM | POA: Diagnosis not present

## 2024-05-14 DIAGNOSIS — Z931 Gastrostomy status: Secondary | ICD-10-CM | POA: Diagnosis not present

## 2024-05-14 MED ORDER — NA SULFATE-K SULFATE-MG SULF 17.5-3.13-1.6 GM/177ML PO SOLN: 1.0000 | ORAL | 0 refills | Status: AC

## 2024-05-14 NOTE — Patient Instructions (Addendum)
 We have sent the following medications to your pharmacy for you to pick up at your convenience: Suprep   _______________________________________________________  If your blood pressure at your visit was 140/90 or greater, please contact your primary care physician to follow up on this.  _______________________________________________________  If you are age 69 or older, your body mass index should be between 23-30. Your Body mass index is 25.6 kg/m. If this is out of the aforementioned range listed, please consider follow up with your Primary Care Provider.  If you are age 35 or younger, your body mass index should be between 19-25. Your Body mass index is 25.6 kg/m. If this is out of the aformentioned range listed, please consider follow up with your Primary Care Provider.   ________________________________________________________  The [facility] providers would like to encourage you to use MYCHART to communicate with providers for non-urgent requests or questions.  Due to long hold times on the telephone, sending your provider a message by [facility] may be a faster and more efficient way to get a response.  Please allow 48 business hours for a response.  Please remember that this is for non-urgent requests.  _______________________________________________________  [facility] is using a team-based approach to care.  Your team is made up of your doctor and two to three APPS. Our APPS (Nurse Practitioners and Physician Assistants) work with your physician to ensure care continuity for you. They are fully qualified to address your health concerns and develop a treatment plan. They communicate directly with your gastroenterologist to care for you. Seeing the Advanced Practice Practitioners on your physician's team can help you by facilitating care more promptly, often allowing for earlier appointments, access to diagnostic testing, procedures, and other specialty referrals.  You have  been scheduled for a colonoscopy. Please follow written instructions given to you at your visit today.   If you use inhalers (even only as needed), please bring them with you on the day of your procedure.  DO NOT TAKE 7 DAYS PRIOR TO TEST- Trulicity (dulaglutide) Ozempic, Wegovy (semaglutide) Mounjaro, Zepbound (tirzepatide) Bydureon Bcise (exanatide extended release)  DO NOT TAKE 1 DAY PRIOR TO YOUR TEST Rybelsus (semaglutide) Adlyxin (lixisenatide) Victoza (liraglutide) Byetta (exanatide) ___________________________________________________________________________   Thank you for choosing me and  Gastroenterology.  Camie Furbish, PA-C

## 2024-05-14 NOTE — Progress Notes (Signed)
 Anthony Campos [id number] 12/19/1955   Chief Complaint: Discuss colonoscopy  Referring Provider: Phyllis Jereld BROCKS* Primary GI MD: Sampson (previous Dr. Teressa)  HPI: Anthony Campos is a 68 y.o. male with past medical history of HTN, HLD, oropharyngeal cancer, normocytic anemia, former smoker who presents today to discuss colonoscopy.  History of squamous cell carcinoma of the oropharynx.  Diagnosed by laryngoscopy 2021, confirmed on CT.  Seen by head and neck cancer surgeon at [facility].  No evidence of distant mets on PET/CT.  Was not a candidate for definitive surgical intervention, followed with radiology and oncology.  Had a PEG tube placed/2021 due to ongoing weight loss. Has ongoing physical therapy and speech therapy.  Continues to remain dependent on the feeding tube for nutritional needs.  Last hemoglobin 12/15/2023 was 11.4.   Discussed the use of AI scribe software for clinical note transcription with the patient, who gave verbal consent to proceed.  History of Present Illness Anthony Campos is a 68 year old male who presents to discuss scheduling a colonoscopy. He is here today with his wife.  Colorectal cancer screening and bowel habits - Last colonoscopy in 2017 with one benign but precancerous polyp identified - Initial recommendation for five-year follow-up, extended to seven years due to guideline changes - No hematochezia - No abdominal pain - Bowel movements occur almost daily without difficulty  Oropharyngeal cancer history and nutritional support - History of oropharyngeal cancer, in remission for approximately two years - Underwent radiation and chemotherapy; no surgical intervention due to complexity of reconstruction - Feeding tube in place, changed over the summer - Primarily uses feeding tube for nutrition, but able to consume soft foods such as mashed potatoes and smoothies by mouth - No dysphagia for liquids - No  coughing with swallowing - No recent surgeries aside from tube replacement - No known history of difficult intubation  Xerostomia - Takes medication for dry mouth - Able to drink water  without difficulty  Cardiopulmonary symptoms and anticoagulation status - No history of myocardial infarction or cerebrovascular accident - No chest pain or shortness of breath - Not on anticoagulant therapy  Abdominal surgical history - No history of abdominal surgeries except for feeding tube placement   Previous GI Procedures/Imaging   Colonoscopy 04/12/2016 - One 3 mm polyp in the ascending colon, removed with a cold snare. Resected and retrieved.  - Diverticulosis in the left colon.  - The examination was otherwise normal on direct and retroflexion views. - Recall 5 years (later changed to 7 years) Path: Surgical [P], ascending, polyp - TUBULAR ADENOMA (X1). - NO HIGH GRADE DYSPLASIA OR MALIGNANCY IDENTIFIED.   Past Medical History:  Diagnosis Date   Erectile dysfunction    Hyperlipidemia    Hypertension    Infection of skin and subcutaneous tissue    Lipoma    Oropharynx cancer (HCC)    Tobacco abuse    hx    Past Surgical History:  Procedure Laterality Date   ABDOMINAL EXPOSURE N/A 07/23/2017   Procedure: ABDOMINAL EXPOSURE;  Surgeon: Oris Krystal FALCON, MD;  Location: [facility] OR;  Service: Vascular;  Laterality: N/A;   ANTERIOR LAT LUMBAR FUSION N/A 07/23/2017   Procedure: LUMBAR 3-4 ANTERIOR LATERAL LUMBAR FUSION 1 LEVEL;  Surgeon: Beuford Anes, MD;  Location: [facility] OR;  Service: Orthopedics;  Laterality: N/A;   ANTERIOR LUMBAR FUSION N/A 07/23/2017   Procedure: LUMBAR 4-5, LUMBAR 5-SACRUM 1, ANTERIOR LUMBAR INTERBODY FUSION WITH INSTRUMENTATION, ALLOGRAFT REQUESTED TIME 6 HRS;  Surgeon: Beuford Anes, MD;  Location: [facility] OR;  Service: Orthopedics;  Laterality: N/A;  LUMBAR 4-5, LUMBAR 5-SACRUM 1, ANTERIOR INTERBODY FUSION WITH INSTRUMENTATION AND ALLOGRAFT REQUESTED TIME 6 HRS   BACK  SURGERY     COLONOSCOPY  04/12/2016   Dr. Teressa removed one polyp   COLONOSCOPY     CYSTOSCOPY  07/23/2017   Procedure: CYSTOSCOPY FLEXIBLE, ATTEMPTED CATHETER INSERTION;  Surgeon: Beuford Anes, MD;  Location: [facility] OR;  Service: Orthopedics;;   CYSTOSCOPY WITH URETHRAL DILATATION N/A 08/28/2017   Procedure: CYSTOSCOPY WITH URETHRAL DILATATION/BALLOON DILATION, Removal of suprapubic catheter;  Surgeon: Matilda Senior, MD;  Location: [facility];  Service: Urology;  Laterality: N/A;   GASTROSTOMY TUBE PLACEMENT     HERNIA REPAIR  2005   Right hernia repair    INSERTION OF SUPRAPUBIC CATHETER Right 07/23/2017   Procedure: INSERTION OF SUPRAPUBIC CATHETER;  Surgeon: Beuford Anes, MD;  Location: [facility] OR;  Service: Orthopedics;  Laterality: Right;    Current Outpatient Medications  Medication Sig Dispense Refill   ciclopirox (PENLAC) 8 % solution Apply topically.     megestrol (MEGACE) 40 MG/ML suspension Take 800 mg by mouth daily.     Multiple Vitamin (ONE-A-DAY MENS PO) Take by mouth every morning.     Nutritional Supplements (FEEDING SUPPLEMENT, OSMOLITE 1.5 CAL,) LIQD Place 1,422 mLs into feeding tube daily.     pilocarpine  (SALAGEN ) 5 MG tablet Place 5 mg into feeding tube 3 (three) times daily.     No current facility-administered medications for this visit.    Allergies as of 05/14/2024   (No Known Allergies)    Family History  Problem Relation Age of Onset   Dementia Mother    Schizophrenia Mother    Diabetes Mother    Hypertension Mother    Hyperlipidemia Mother    Depression Sister    Dementia Sister    Schizophrenia Sister    Diabetes Sister    Hyperlipidemia Sister    Hypertension Sister    Schizophrenia Sister    Dementia Sister    Diabetes Sister    Hypertension Sister    Hyperlipidemia Sister    Schizophrenia Sister    Dementia Sister    Diabetes Sister    Hypertension Sister    Hyperlipidemia Sister    Diabetes Son    Heart disease  Neg Hx    Stroke Neg Hx    Colon polyps Neg Hx    Colon cancer Neg Hx    Rectal cancer Neg Hx    Stomach cancer Neg Hx    Esophageal cancer Neg Hx     Social History   Tobacco Use   Smoking status: Former    Types: Cigarettes   Smokeless tobacco: Never   Tobacco comments:    2 gigs per day. Hasn't smoked cigarettes in 5 years  Vaping Use   Vaping status: Former   Start date: 06/28/2017  Substance Use Topics   Alcohol use: Not Currently    Comment: Beer - rarely.- holidays like thanksgiving and x mas    Drug use: No     Review of Systems:    Constitutional: No unintentional weight loss, fever, chills Cardiovascular: No chest pain Respiratory: No SOB  Gastrointestinal: See HPI and otherwise negative   Physical Exam:  Vital signs: BP 110/60 (BP Location: Left Arm, Patient Position: Sitting, Cuff Size: Normal)   Pulse 84   Ht 5' 10.5 (1.791 m) Comment: height measured without shoes  Wt 181 lb (82.1  kg)   BMI 25.60 kg/m   Constitutional: Pleasant, well-appearing male in NAD, alert and cooperative Head:  Normocephalic and atraumatic.  Eyes: No scleral icterus.  Mouth: Lacks teeth Respiratory: Respirations even and unlabored. Lungs clear to auscultation bilaterally.  No wheezes, crackles, or rhonchi.  Cardiovascular:  Regular rate and rhythm. No murmurs. No peripheral edema. Gastrointestinal:  Soft, nondistended, nontender. No rebound or guarding. PEG tube in place, surrounding skin is healthy in appearance. Normal bowel sounds. No appreciable masses or hepatomegaly. Rectal:  Not performed.  Neurologic:  Alert and oriented x4;  grossly normal neurologically.  Skin:   Dry and intact without significant lesions or rashes. Psychiatric: Oriented to person, place and time. Demonstrates good judgement and reason without abnormal affect or behaviors.   RELEVANT LABS AND IMAGING: CBC    Component Value Date/Time   WBC 4.7 12/05/2023 1006   RBC 3.58 (L) 12/05/2023 1006    HGB 11.2 (L) 12/05/2023 1006   HCT 34.7 (L) 12/05/2023 1006   PLT 168 12/05/2023 1006   MCV 96.9 12/05/2023 1006   MCH 31.3 12/05/2023 1006   MCHC 32.3 12/05/2023 1006   RDW 13.1 12/05/2023 1006   LYMPHSABS 0.9 09/07/2023 1710   MONOABS 1.0 09/07/2023 1710   EOSABS 221 12/05/2023 1006   BASOSABS 28 12/05/2023 1006    CMP     Component Value Date/Time   NA 137 12/05/2023 1006   NA 139 07/16/2018 1500   K 4.8 12/05/2023 1006   CL 99 12/05/2023 1006   CO2 30 12/05/2023 1006   GLUCOSE 90 12/05/2023 1006   BUN 29 (H) 12/05/2023 1006   BUN 18 07/16/2018 1500   CREATININE 0.89 12/05/2023 1006   CALCIUM  9.4 12/05/2023 1006   PROT 7.9 12/05/2023 1006   ALBUMIN 3.1 (L) 09/08/2023 1253   AST 26 12/05/2023 1006   ALT 17 12/05/2023 1006   ALKPHOS 43 09/08/2023 1253   BILITOT 0.3 12/05/2023 1006   GFRNONAA >60 09/08/2023 1253   GFRNONAA >89 07/22/2014 1036   GFRAA 96 07/16/2018 1500   GFRAA >89 07/22/2014 1036     Assessment/Plan:   Assessment & Plan History of adenomatous colon polyp Due for colonoscopy; last in 2017 with benign precancerous polyp. Due for recall. No bowel issues.   - Schedule colonoscopy. I thoroughly discussed the procedure with the patient to include nature of the procedure, alternatives, benefits, and risks (including but not limited to bleeding, infection, perforation, anesthesia/cardiac/pulmonary complications). Patient verbalized understanding and gave verbal consent to proceed with procedure.   History of oropharyngeal cancer in remission Gastrostomy tube dependence Oropharyngeal cancer treated with radiation and chemotherapy, in remission for two years. Regular oncology follow-ups. Gastrostomy tube for supplemental nutrition though patient is able to eat some soft foods as well as swallow liquids by mouth.  PEG tube replaced this summer. No site issues.  Patient feels he will be able to do the colonoscopy prep by mouth, and switch to PEG tube if any  issues.  Case and plan discussed with Dr. San in office today, particularly with regards to bowel prep and appropriate location for procedure.  I do not see any documentation regarding patient being a difficult intubation following his chemo and radiation for oropharyngeal cancer.  Will tentatively schedule procedure in [facility], and send to team for review.     Camie Furbish, PA-C [facility] 05/14/2024, 2:51 PM  Patient Care Team: Phyllis Jereld BROCKS, NP as PCP - General (Internal Medicine) Beuford Anes, MD as Consulting Physician (  Orthopedic Surgery)

## 2024-05-15 ENCOUNTER — Encounter: Payer: Self-pay | Admitting: Gastroenterology

## 2024-05-16 NOTE — Progress Notes (Signed)
 ____________________________________________________________  Attending physician addendum:  Thank you for sending this case to me. I have reviewed the entire note and agree with the plan.  I agree he can have his procedure in the [facility] as long as he does not have a documented history of difficult intubation. Chart copied to our anesthesia provider as well. If patient requires procedure at the hospital, it would go to doc with the next available hospital block slot  Victory Brand, MD  ____________________________________________________________ THEORA

## 2024-05-17 ENCOUNTER — Telehealth: Payer: Self-pay | Admitting: Gastroenterology

## 2024-05-17 NOTE — Telephone Encounter (Signed)
 Attempted to reach patient to arrange hospital colonoscopy. Currently time w/ [place] on 12/15. Requested patient please return call.

## 2024-05-17 NOTE — Telephone Encounter (Signed)
 Patient had been tentatively scheduled for colonoscopy in the [facility]; however, per Norleen Schillings, CRNA, this needs to be moved to the hospital setting due to documented history of difficult intubation. Patient had been assigned to Dr. Legrand based on procedure availability only.  Please schedule procedure with whichever MD has the next available hospital block, and please let me know who this ends up being scheduled with so I can forward my note to them, thank you.

## 2024-05-17 NOTE — Progress Notes (Addendum)
 Camie,  Patient needs to be notified that anesthesia has reviewed prior records and discovered that he has a history of difficult intubation.  Therefore, his [facility] procedure needs to be canceled and rescheduled to a hospital outpatient slot with the Pod C physician that has the next available opening (me, Nandigam or Mansouraty). Then note must be routed to that endoscopist.  VEAR Brand

## 2024-05-18 ENCOUNTER — Telehealth: Payer: Self-pay | Admitting: Gastroenterology

## 2024-05-18 NOTE — Telephone Encounter (Signed)
 See alternate phone note dated 11/11

## 2024-05-18 NOTE — Telephone Encounter (Signed)
 Attempted to reach patient. No answer, left VM for patient to return call. [facility] procedure cancelled.

## 2024-05-18 NOTE — Telephone Encounter (Signed)
 Update to previous recommendation:  Patient does need to be rescheduled in hospital outpatient slot based on history of difficult intubation, however he needs to be with a Pod C MD that has the next available opening (Danis, Nandigam, Mansouraty). See Dr. Clayburn addendum to my note.  Please cancel [facility] procedure that is currently scheduled. Thank you

## 2024-05-19 NOTE — Telephone Encounter (Signed)
 Pt returned call. Advised pt that we would need to cancel his currently scheduled colonoscopy on 06/24/24. Pt verbalized understanding. Advised pt that our current opening for [facility] is 08/06/2023 with Dr. Legrand. Pt verbalized understanding. Will work on getting the pt scheduled for this date.

## 2024-05-19 NOTE — Telephone Encounter (Signed)
 Attempted to reach patient. No answer, left VM for patient to return call.

## 2024-05-20 ENCOUNTER — Other Ambulatory Visit: Payer: Self-pay

## 2024-05-20 NOTE — Telephone Encounter (Signed)
 Pt scheduled for colonoscopy on 08/05/2024 at [facility]. Procedure time 07:30. Arrival time 06:00. Case ID # [id number].

## 2024-05-20 NOTE — Telephone Encounter (Signed)
 Attempted to reach pt to discuss dates and arrival times. No answer, left vm for pt to return call.

## 2024-05-20 NOTE — Telephone Encounter (Signed)
 Pre-op appointment scheduled for pt on 07/26/24 at 1030.

## 2024-05-21 ENCOUNTER — Ambulatory Visit: Payer: Self-pay | Admitting: Adult Health

## 2024-05-21 NOTE — Telephone Encounter (Signed)
 Letter mailed to patient with appointment dates and times.

## 2024-05-21 NOTE — Telephone Encounter (Signed)
 Attempted to reach patient. No answer, left VM for patient to return call.

## 2024-05-24 ENCOUNTER — Ambulatory Visit (INDEPENDENT_AMBULATORY_CARE_PROVIDER_SITE_OTHER): Admitting: Adult Health

## 2024-05-24 ENCOUNTER — Encounter: Payer: Self-pay | Admitting: Adult Health

## 2024-05-24 VITALS — BP 116/62 | HR 88 | Temp 97.9°F | Ht 70.5 in | Wt 182.2 lb

## 2024-05-24 DIAGNOSIS — R1312 Dysphagia, oropharyngeal phase: Secondary | ICD-10-CM

## 2024-05-24 DIAGNOSIS — Z85819 Personal history of malignant neoplasm of unspecified site of lip, oral cavity, and pharynx: Secondary | ICD-10-CM | POA: Diagnosis not present

## 2024-05-24 DIAGNOSIS — R630 Anorexia: Secondary | ICD-10-CM | POA: Diagnosis not present

## 2024-05-24 DIAGNOSIS — R7303 Prediabetes: Secondary | ICD-10-CM

## 2024-05-24 DIAGNOSIS — R682 Dry mouth, unspecified: Secondary | ICD-10-CM

## 2024-05-24 NOTE — Telephone Encounter (Signed)
 Spoke with pt. Discussed date and time for pre-visit appointment. Also discussed with pt that I have sent him a letter in the mail with this date, time and our address. Pt verbalized understanding.

## 2024-05-24 NOTE — Progress Notes (Signed)
 [facility] clinic  Provider:  Jereld Serum DNP  Code Status:  Full Code  Goals of Care:     05/24/2024    9:47 AM  Advanced Directives  Does Patient Have a Medical Advance Directive? No  Would patient like information on creating a medical advance directive? No - Patient declined     Chief Complaint  Patient presents with   Medical Management of Chronic Issues    3 MONTH FOLLOW UP    Headache    Patient states that he has a lot going on. Seems to be a little streesed  Discussed the use of AI scribe software for clinical note transcription with the patient, who gave verbal consent to proceed.   HPI: Patient is a 68 y.o. male seen today for a 64-month follow up of chronic medical issues.  He experiences headaches localized to the left eye, which he attributes to stress related to his truck's mechanical issues. He feels better after visiting the clinic and engaging in deep breathing exercises.  He has a poor appetite and is currently taking Megace (megestrol) at a dose of 800 mg daily, administered around midday. He supplements his diet with smoothies to help maintain his weight and has gained a pound recently.  His most recent A1c is 5.9. He mentions difficulty with walking but engages in weight lifting and stretching exercises at home.  He experiences dry mouth and takes pilocarpine  or Salagen  5 mg three times a day, which he dissolves in water  before administration through his PEG tube. This regimen helps manage his dry mouth symptoms.  He has oropharyngeal dysphagia and uses a PEG tube for nutrition. He can drink liquids but must be cautious with solid foods due to a risk of aspiration. He enjoys mashed potatoes and smoothies, which are easier to manage.  He has a history of oropharyngeal cancer, treated with chemotherapy and radiation, resulting in difficulty swallowing and dental issues. Radiation was particularly challenging, causing him to lose teeth and experience  changes in his ability to swallow.  He is scheduled for a colonoscopy on January 29, with a pre-op appointment on January 19.  He has a history of smoking and occasional alcohol use, which he has since ceased.    Past Medical History:  Diagnosis Date   Erectile dysfunction    Hyperlipidemia    Hypertension    Infection of skin and subcutaneous tissue    Lipoma    Oropharynx cancer (HCC)    Tobacco abuse    hx    Past Surgical History:  Procedure Laterality Date   ABDOMINAL EXPOSURE N/A 07/23/2017   Procedure: ABDOMINAL EXPOSURE;  Surgeon: Oris Krystal FALCON, MD;  Location: [facility] OR;  Service: Vascular;  Laterality: N/A;   ANTERIOR LAT LUMBAR FUSION N/A 07/23/2017   Procedure: LUMBAR 3-4 ANTERIOR LATERAL LUMBAR FUSION 1 LEVEL;  Surgeon: Beuford Anes, MD;  Location: [facility] OR;  Service: Orthopedics;  Laterality: N/A;   ANTERIOR LUMBAR FUSION N/A 07/23/2017   Procedure: LUMBAR 4-5, LUMBAR 5-SACRUM 1, ANTERIOR LUMBAR INTERBODY FUSION WITH INSTRUMENTATION, ALLOGRAFT REQUESTED TIME 6 HRS;  Surgeon: Beuford Anes, MD;  Location: [facility] OR;  Service: Orthopedics;  Laterality: N/A;  LUMBAR 4-5, LUMBAR 5-SACRUM 1, ANTERIOR INTERBODY FUSION WITH INSTRUMENTATION AND ALLOGRAFT REQUESTED TIME 6 HRS   BACK SURGERY     COLONOSCOPY  04/12/2016   Dr. Teressa removed one polyp   COLONOSCOPY     CYSTOSCOPY  07/23/2017   Procedure: CYSTOSCOPY FLEXIBLE, ATTEMPTED CATHETER INSERTION;  Surgeon: Beuford,  Oneil, MD;  Location: [facility] OR;  Service: Orthopedics;;   CYSTOSCOPY WITH URETHRAL DILATATION N/A 08/28/2017   Procedure: CYSTOSCOPY WITH URETHRAL DILATATION/BALLOON DILATION, Removal of suprapubic catheter;  Surgeon: Matilda Senior, MD;  Location: [facility];  Service: Urology;  Laterality: N/A;   GASTROSTOMY TUBE PLACEMENT     HERNIA REPAIR  2005   Right hernia repair    INSERTION OF SUPRAPUBIC CATHETER Right 07/23/2017   Procedure: INSERTION OF SUPRAPUBIC CATHETER;  Surgeon: Beuford Oneil, MD;   Location: [facility] OR;  Service: Orthopedics;  Laterality: Right;    No Known Allergies  Outpatient Encounter Medications as of 05/24/2024  Medication Sig   megestrol (MEGACE) 40 MG/ML suspension Take 800 mg by mouth daily.   Multiple Vitamin (ONE-A-DAY MENS PO) Take by mouth every morning.   Na Sulfate-K Sulfate-Mg Sulfate concentrate (SUPREP BOWEL PREP KIT) 17.5-3.13-1.6 GM/177ML SOLN Take 1 kit (354 mLs total) by mouth as directed. For colonoscopy prep   Nutritional Supplements (FEEDING SUPPLEMENT, OSMOLITE 1.5 CAL,) LIQD Place 1,422 mLs into feeding tube daily.   pilocarpine  (SALAGEN ) 5 MG tablet Place 5 mg into feeding tube 3 (three) times daily.   ciclopirox (PENLAC) 8 % solution Apply topically. (Patient not taking: Reported on 05/24/2024)   No facility-administered encounter medications on file as of 05/24/2024.    Review of Systems:  Review of Systems  Constitutional:  Negative for activity change, appetite change and fever.  HENT:  Negative for sore throat.   Eyes: Negative.   Cardiovascular:  Negative for chest pain and leg swelling.  Gastrointestinal:  Negative for abdominal distention, diarrhea and vomiting.  Genitourinary:  Negative for dysuria, frequency and urgency.  Skin:  Negative for color change.  Neurological:  Negative for dizziness and headaches.  Psychiatric/Behavioral:  Negative for behavioral problems and sleep disturbance. The patient is not nervous/anxious.     Health Maintenance  Topic Date Due   Zoster Vaccines- Shingrix (1 of 2) Never done   Colonoscopy  04/12/2021   Medicare Annual Wellness (AWV)  02/18/2025   Hepatitis C Screening  Completed   Meningococcal B Vaccine  Aged Out   DTaP/Tdap/Td  Discontinued   Pneumococcal Vaccine: 50+ Years  Discontinued   Influenza Vaccine  Discontinued   COVID-19 Vaccine  Discontinued    Physical Exam: Vitals:   05/24/24 0951  BP: 116/62  Pulse: 88  Temp: 97.9 F (36.6 C)  TempSrc: Temporal  SpO2: 96%   Weight: 182 lb 3.2 oz (82.6 kg)  Height: 5' 10.5 (1.791 m)   Body mass index is 25.77 kg/m. Physical Exam Constitutional:      Appearance: Normal appearance.  HENT:     Head: Normocephalic and atraumatic.     Mouth/Throat:     Mouth: Mucous membranes are moist.  Eyes:     Conjunctiva/sclera: Conjunctivae normal.  Cardiovascular:     Rate and Rhythm: Normal rate and regular rhythm.     Pulses: Normal pulses.     Heart sounds: Normal heart sounds.  Pulmonary:     Effort: Pulmonary effort is normal.     Breath sounds: Normal breath sounds.  Abdominal:     General: Bowel sounds are normal.     Palpations: Abdomen is soft.     Comments: Peg tube  Musculoskeletal:        General: No swelling. Normal range of motion.     Cervical back: Normal range of motion.  Skin:    General: Skin is warm and dry.  Neurological:  General: No focal deficit present.     Mental Status: He is alert and oriented to person, place, and time.  Psychiatric:        Mood and Affect: Mood normal.        Behavior: Behavior normal.        Thought Content: Thought content normal.        Judgment: Judgment normal.     Labs reviewed: Basic Metabolic Panel: Recent Labs    09/07/23 1710 09/07/23 1719 09/08/23 1253 12/05/23 1006  NA 135 139 138 137  K 4.0 4.2 4.3 4.8  CL 99 105 105 99  CO2 25  --  25 30  GLUCOSE 90 87 76 90  BUN 30* 36* 24* 29*  CREATININE 0.90 1.10 0.93 0.89  CALCIUM  9.0  --  8.4* 9.4  MG  --   --  2.1  --    Liver Function Tests: Recent Labs    09/07/23 1710 09/08/23 1253 12/05/23 1006  AST 32 28 26  ALT 20 15 17   ALKPHOS 60 43  --   BILITOT 0.5 0.6 0.3  PROT 8.0 6.6 7.9  ALBUMIN 3.6 3.1*  --    Recent Labs    09/07/23 1710  LIPASE 29   No results for input(s): AMMONIA in the last 8760 hours. CBC: Recent Labs    09/07/23 1710 09/07/23 1719 09/08/23 1835 09/09/23 0429 12/05/23 1006  WBC 4.5   < > 4.2 3.2* 4.7  NEUTROABS 2.3  --   --   --  2,580   HGB 11.2*   < > 12.1* 10.0* 11.2*  HCT 34.4*   < > 37.6* 30.8* 34.7*  MCV 98.0   < > 99.2 98.4 96.9  PLT 157   < > 107* 136* 168   < > = values in this interval not displayed.   Lipid Panel: Recent Labs    12/05/23 1006  CHOL 174  HDL 65  LDLCALC 90  TRIG 96  CHOLHDL 2.7   Lab Results  Component Value Date   HGBA1C 5.9 (H) 12/05/2023    Procedures since last visit: No results found.  Assessment/Plan  1. History of oropharyngeal cancer (Primary) -  Oropharyngeal cancer in remission post-chemotherapy and radiation. Persistent dysphagia and xerostomia due to radiation. - Continue management for dysphagia and xerostomia.  2. Oropharyngeal dysphagia -  Chronic dysphagia post-radiation for oropharyngeal cancer. Managed with PEG tube. Swallows liquids and pureed foods. - Continue PEG tube for nutrition. - Encouraged pureed foods and liquids.  3. Poor appetite -  Managed with Megace 800 mg daily. Some appetite improvement and weight gain noted. - Continue Megace 800 mg daily. - Encouraged nutritional intake with smoothies and pureed foods.  4. Dry mouth -  Chronic xerostomia managed with pilocarpine . Improvement noted. - Continue pilocarpine  5 mg three times daily.  5. Prediabetes -  A1c 5.9%. Discussed lifestyle changes to prevent diabetes progression. - Encouraged 150 minutes of exercise weekly.     General Health Maintenance Discussed shingles vaccine importance. Willing to receive if no adverse effects. - Recommended shingles vaccine at pharmacy.     Labs/tests ordered:   None   Return in about 6 months (around 11/21/2024).  Naomee Nowland Medina-Vargas, NP

## 2024-06-17 DIAGNOSIS — C109 Malignant neoplasm of oropharynx, unspecified: Secondary | ICD-10-CM | POA: Diagnosis not present

## 2024-06-17 DIAGNOSIS — Z923 Personal history of irradiation: Secondary | ICD-10-CM | POA: Diagnosis not present

## 2024-06-17 DIAGNOSIS — R1312 Dysphagia, oropharyngeal phase: Secondary | ICD-10-CM | POA: Diagnosis not present

## 2024-06-17 DIAGNOSIS — R252 Cramp and spasm: Secondary | ICD-10-CM | POA: Diagnosis not present

## 2024-06-24 ENCOUNTER — Encounter: Admitting: Gastroenterology

## 2024-07-22 ENCOUNTER — Encounter (HOSPITAL_COMMUNITY): Payer: Self-pay | Admitting: Gastroenterology

## 2024-07-22 NOTE — Progress Notes (Signed)
 Attempted to obtain medical history for pre op call via telephone, unable to reach at this time. HIPAA compliant voicemail message left requesting return call to pre surgical testing department.

## 2024-07-26 ENCOUNTER — Telehealth: Payer: Self-pay

## 2024-07-26 ENCOUNTER — Ambulatory Visit

## 2024-07-26 ENCOUNTER — Encounter: Payer: Self-pay | Admitting: Gastroenterology

## 2024-07-26 VITALS — Ht 70.5 in | Wt 184.0 lb

## 2024-07-26 DIAGNOSIS — Z860101 Personal history of adenomatous and serrated colon polyps: Secondary | ICD-10-CM

## 2024-07-26 NOTE — Telephone Encounter (Signed)
VM left making patient aware.

## 2024-07-26 NOTE — Telephone Encounter (Signed)
 Thanks for checking.  He does not use the tube all the time, but to answer your question, no tube feeds after midnight, same as n.p.o recommendations.  VEAR Brand MD

## 2024-07-26 NOTE — Progress Notes (Signed)
 PCP MD at time of PV: Anthony GLENWOOD Delude, MD  __________________________________________________________________________________________________________________________________________  No egg allergy known to patient  No soy allergy known to patient No issues known to pt with past sedation with any surgeries or procedures Patient denies ever being told they had issues or difficulty with intubation  No FH of Malignant Hyperthermia Pt is not on diet pills Pt is not on  home 02  Pt is not on blood thinners  No A fib or A flutter Have any cardiac testing pending--no  LOA: independent  No Chew or Snuff tobacco __________________________________________________________________________________________________________________________________________  Constipation: no  Prep: suprep __________________________________________________________________________________________________________________________________________  PV completed with patient. Prep instructions reviewed and provided during apt. Rx sent to preferred pharmacy.  __________________________________________________________________________________________________________________________________________

## 2024-07-26 NOTE — Telephone Encounter (Signed)
 Dr. Legrand,   This patient came in for PV today reports doing tube feeds q 8 hours. Can you please verify when feeds will need to stop prior to his colonoscopy he is scheduled on 1/29 0730 at [facility].    Thank you, PV

## 2024-07-28 ENCOUNTER — Telehealth: Payer: Self-pay | Admitting: Gastroenterology

## 2024-07-28 NOTE — Telephone Encounter (Addendum)
 Procedure:Colonoscopy Procedure date: 08/05/24 Procedure location: [facility] Arrival Time: 6:00 am Spoke with the patient Y/N:   No, I left a detailed message on [phone] on 07/28/24 @ 3:21 pm for the patient to return call  No, I left a detailed message on [phone] on 07/29/24 @ 3:46 pm for the patient to return call  Yes 07/30/24 @ 10:11 am  Any prep concerns? No  Has the patient obtained the prep from the pharmacy? Yes Do you have a care partner and transportation: Yes Any additional concerns? No

## 2024-08-04 NOTE — Anesthesia Preprocedure Evaluation (Signed)
"                                    Anesthesia Evaluation  Patient identified by MRN, date of birth, ID band Patient awake    Reviewed: Allergy & Precautions, NPO status , Patient's Chart, lab work & pertinent test results  History of Anesthesia Complications (+) DIFFICULT AIRWAY and history of anesthetic complications (oropharyngeal cancer: chemo, XRT)  Airway Mallampati: III  TM Distance: >3 FB Neck ROM: Limited  Mouth opening: Limited Mouth Opening  Dental  (+) Edentulous Upper, Edentulous Lower   Pulmonary neg pulmonary ROS, Patient abstained from smoking., former smoker   breath sounds clear to auscultation       Cardiovascular hypertension (not on meds),  Rhythm:Regular Rate:Normal     Neuro/Psych negative neurological ROS     GI/Hepatic negative GI ROS, Neg liver ROS,,,  Endo/Other  negative endocrine ROS    Renal/GU negative Renal ROS     Musculoskeletal   Abdominal   Peds  Hematology negative hematology ROS (+)   Anesthesia Other Findings H/o oropharyngeal cancer: squamous cell, Chemo, XRT  Reproductive/Obstetrics                              Anesthesia Physical Anesthesia Plan  ASA: 3  Anesthesia Plan: MAC   Post-op Pain Management: Minimal or no pain anticipated   Induction:   PONV Risk Score and Plan: 1 and Treatment may vary due to age or medical condition  Airway Management Planned: Natural Airway and Simple Face Mask  Additional Equipment: None  Intra-op Plan:   Post-operative Plan:   Informed Consent: I have reviewed the patients History and Physical, chart, labs and discussed the procedure including the risks, benefits and alternatives for the proposed anesthesia with the patient or authorized representative who has indicated his/her understanding and acceptance.       Plan Discussed with: CRNA and Surgeon  Anesthesia Plan Comments: (VideoGlide available)         Anesthesia Quick  Evaluation  "

## 2024-08-05 ENCOUNTER — Encounter (HOSPITAL_COMMUNITY): Payer: Self-pay | Admitting: Gastroenterology

## 2024-08-05 ENCOUNTER — Encounter (HOSPITAL_COMMUNITY): Payer: Self-pay | Admitting: Anesthesiology

## 2024-08-05 ENCOUNTER — Other Ambulatory Visit: Payer: Self-pay

## 2024-08-05 ENCOUNTER — Ambulatory Visit (HOSPITAL_COMMUNITY)
Admission: RE | Admit: 2024-08-05 | Discharge: 2024-08-05 | Disposition: A | Discharge status: Home / Self Care | Attending: Gastroenterology | Admitting: Gastroenterology

## 2024-08-05 ENCOUNTER — Encounter (HOSPITAL_COMMUNITY)
Admission: RE | Disposition: A | Discharge status: Home / Self Care | Payer: Self-pay | Source: Home / Self Care | Attending: Gastroenterology

## 2024-08-05 DIAGNOSIS — I10 Essential (primary) hypertension: Secondary | ICD-10-CM | POA: Diagnosis not present

## 2024-08-05 DIAGNOSIS — D122 Benign neoplasm of ascending colon: Secondary | ICD-10-CM | POA: Diagnosis not present

## 2024-08-05 DIAGNOSIS — Q439 Congenital malformation of intestine, unspecified: Secondary | ICD-10-CM | POA: Diagnosis not present

## 2024-08-05 DIAGNOSIS — Z87891 Personal history of nicotine dependence: Secondary | ICD-10-CM | POA: Insufficient documentation

## 2024-08-05 DIAGNOSIS — K5730 Diverticulosis of large intestine without perforation or abscess without bleeding: Secondary | ICD-10-CM | POA: Insufficient documentation

## 2024-08-05 DIAGNOSIS — Z1211 Encounter for screening for malignant neoplasm of colon: Secondary | ICD-10-CM

## 2024-08-05 DIAGNOSIS — Z860101 Personal history of adenomatous and serrated colon polyps: Secondary | ICD-10-CM | POA: Diagnosis not present

## 2024-08-05 DIAGNOSIS — Z85818 Personal history of malignant neoplasm of other sites of lip, oral cavity, and pharynx: Secondary | ICD-10-CM | POA: Diagnosis not present

## 2024-08-05 DIAGNOSIS — Z860100 Personal history of colon polyps, unspecified: Secondary | ICD-10-CM

## 2024-08-05 HISTORY — DX: Failed or difficult intubation, initial encounter: T88.4XXA

## 2024-08-05 MED ORDER — PHENYLEPHRINE HCL (PRESSORS) 10 MG/ML IV SOLN
INTRAVENOUS | Status: DC | PRN
  Administered 2024-08-05: 80 ug via INTRAVENOUS
  Administered 2024-08-05: 160 ug via INTRAVENOUS

## 2024-08-05 MED ORDER — PROPOFOL 500 MG/50ML IV EMUL
INTRAVENOUS | Status: DC | PRN
  Administered 2024-08-05: 50 mg via INTRAVENOUS
  Administered 2024-08-05: 150 ug/kg/min via INTRAVENOUS
  Administered 2024-08-05: 20 mg via INTRAVENOUS

## 2024-08-05 MED ORDER — LACTATED RINGERS IV SOLN
INTRAVENOUS | Status: AC | PRN
  Administered 2024-08-05: 1000 mL via INTRAVENOUS

## 2024-08-05 MED ORDER — PHENYLEPHRINE HCL (PRESSORS) 10 MG/ML IV SOLN
INTRAVENOUS | Status: AC
  Filled 2024-08-05: qty 1

## 2024-08-05 MED ORDER — PROPOFOL 10 MG/ML IV BOLUS
INTRAVENOUS | Status: AC
  Filled 2024-08-05: qty 20

## 2024-08-05 MED ORDER — PROPOFOL 1000 MG/100ML IV EMUL
INTRAVENOUS | Status: AC
  Filled 2024-08-05: qty 100

## 2024-08-05 NOTE — Anesthesia Postprocedure Evaluation (Signed)
"   Anesthesia Post Note  Patient: Anthony Campos  Procedure(s) Performed: COLONOSCOPY POLYPECTOMY, INTESTINE     Patient location during evaluation: Endoscopy Anesthesia Type: MAC Level of consciousness: oriented, patient cooperative and awake and alert Pain management: pain level controlled Vital Signs Assessment: post-procedure vital signs reviewed and stable Respiratory status: nonlabored ventilation, spontaneous breathing and respiratory function stable Cardiovascular status: blood pressure returned to baseline and stable Postop Assessment: able to ambulate and no apparent nausea or vomiting Anesthetic complications: no   No notable events documented.  Last Vitals:  Vitals:   08/05/24 0835 08/05/24 0845  BP: 108/68 (!) 114/90  Pulse:    Resp: 18 18  Temp:    SpO2: 100% 100%    Last Pain:  Vitals:   08/05/24 0845  TempSrc:   PainSc: 0-No pain                 Nakota Ackert,E. Marelyn Rouser      "

## 2024-08-05 NOTE — Interval H&P Note (Signed)
 History and Physical Interval Note:  08/05/2024 7:28 AM  Anthony Campos  has presented today for surgery, with the diagnosis of History of colon polyps.  The various methods of treatment have been discussed with the patient and family. After consideration of risks, benefits and other options for treatment, the patient has consented to  Procedures: COLONOSCOPY (N/A) as a surgical intervention.  The patient's history has been reviewed, patient examined, no change in status, stable for surgery.  I have reviewed the patient's chart and labs.  Questions were answered to the patient's satisfaction.     Victory LITTIE Brand III

## 2024-08-05 NOTE — Anesthesia Procedure Notes (Signed)
 Procedure Name: MAC Date/Time: 08/05/2024 7:30 AM  Performed by: Nada Corean CROME, CRNAPre-anesthesia Checklist: Patient identified, Emergency Drugs available, Suction available, Patient being monitored and Timeout performed Patient Re-evaluated:Patient Re-evaluated prior to induction Oxygen Delivery Method: Nasal cannula Preoxygenation: Pre-oxygenation with 100% oxygen Induction Type: IV induction Placement Confirmation: positive ETCO2 Dental Injury: Teeth and Oropharynx as per pre-operative assessment

## 2024-08-05 NOTE — Op Note (Addendum)
 [facility] Patient Name: Anthony Campos Procedure Date: 08/05/2024 MRN: [id number] Attending MD: Victory CROME. Legrand , MD, [id number] Date of Birth: Aug 17, 1955 CSN: [id number] Age: 69 Admit Type: Outpatient Procedure:                Colonoscopy Indications:              Surveillance: Personal history of adenomatous                            polyps on last colonoscopy > 5 years ago                           diminutive tubular adenoma in 2017 (Dr Teressa) Providers:                Victory CROME. Legrand, MD, Haskel Chris, Technician,                            Randall Lines, RN, Darleene Bare, RN Referring MD:              Medicines:                Monitored Anesthesia Care Complications:            No immediate complications. Estimated Blood Loss:     Estimated blood loss was minimal. Procedure:                Pre-Anesthesia Assessment:                           - Prior to the procedure, a History and Physical                            was performed, and patient medications and                            allergies were reviewed. The patient's tolerance of                            previous anesthesia was also reviewed. The risks                            and benefits of the procedure and the sedation                            options and risks were discussed with the patient.                            All questions were answered, and informed consent                            was obtained. Prior Anticoagulants: The patient has                            taken no anticoagulant or antiplatelet agents. ASA  Grade Assessment: III - A patient with severe                            systemic disease. After reviewing the risks and                            benefits, the patient was deemed in satisfactory                            condition to undergo the procedure.                           After obtaining informed consent, the colonoscope                             was passed under direct vision. Throughout the                            procedure, the patient's blood pressure, pulse, and                            oxygen saturations were monitored continuously. The                            CF-HQ190L ([id number]) Olympus colonoscope was                            introduced through the anus and advanced to the the                            cecum, identified by appendiceal orifice and                            ileocecal valve. The colonoscopy was performed with                            difficulty due to multiple diverticula in the                            colon, a redundant colon and a tortuous colon.                            Successful completion of the procedure was aided by                            changing the patient to a supine position, using                            manual pressure, withdrawing the scope and                            replacing with the UltraSlim scope ([id number]) and  straightening and shortening the scope to obtain                            bowel loop reduction. Ultra-slim colonoscope                            allowed passage through a tortuous sigmoid colon,                            but its flexibility led to scope looping requiring                            abdominal pressure and repositioing to reach the                            cecum with difficulty. The patient tolerated the                            procedure well. The quality of the bowel                            preparation was good after lavage. The ileocecal                            valve, appendiceal orifice, and rectum were                            photographed. Scope In: 7:41:11 AM Scope Out: 8:19:16 AM Scope Withdrawal Time: 0 hours 15 minutes 1 second  Total Procedure Duration: 0 hours 38 minutes 5 seconds  Findings:      The perianal and digital rectal examinations were normal.      Repeat examination  of right colon under NBI performed.      Two sessile polyps were found in the proximal ascending colon and distal       ascending colon. The polyps were 5 mm in size. These polyps were removed       with a cold snare. Resection and retrieval were complete.      Multiple diverticula were found in the left colon. Associated       tortuosity, especially at the rectosigmoid junction.(see above)      The exam was otherwise without abnormality on direct and retroflexion       views. Impression:               - Two 5 mm polyps in the proximal ascending colon                            and in the distal ascending colon, removed with a                            cold snare. Resected and retrieved.                           - Diverticulosis in the left colon.                           -  The examination was otherwise normal on direct                            and retroflexion views. Moderate Sedation:      MAC sedation used Recommendation:           - Patient has a contact number available for                            emergencies. The signs and symptoms of potential                            delayed complications were discussed with the                            patient. Return to normal activities tomorrow.                            Written discharge instructions were provided to the                            patient.                           - Resume previous diet.                           - Continue present medications.                           - Await pathology results.                           - Repeat colonoscopy is recommended for                            surveillance. The colonoscopy date will be                            determined after pathology results from today's                            exam become available for review. (start with                            pediatric colonoscope for next exam) Procedure Code(s):        --- Professional ---                            [phone], Colonoscopy, flexible; with removal of                            tumor(s), polyp(s), or other lesion(s) by snare                            technique Diagnosis Code(s):        --- Professional ---  Z86.010, Personal history of colonic polyps                           D12.2, Benign neoplasm of ascending colon                           K57.30, Diverticulosis of large intestine without                            perforation or abscess without bleeding CPT copyright 2022 [employer]. All rights reserved. The codes documented in this report are preliminary and upon coder review may  be revised to meet current compliance requirements. Paz Winsett L. Legrand, MD 08/05/2024 8:30:26 AM This report has been signed electronically. Number of Addenda: 0

## 2024-08-05 NOTE — H&P (Addendum)
 History and Physical:  This patient presents for endoscopic testing for: History of colon polyps 69 year old man here today for surveillance colonoscopy with a history of a diminutive adenomatous colon polyp during last colonoscopy by Dr. Teressa in 2017. Further clinical details of his care are outlined in the 05/14/2024 [facility] office note.  Procedures being done in the hospital outpatient endoscopy lab due to suspected difficult airway from previous head and neck cancer treatment.  Patient is otherwise without complaints or active issues today.   Past Medical History: Past Medical History:  Diagnosis Date   Difficult intubation    Erectile dysfunction    Hyperlipidemia    Hypertension    Infection of skin and subcutaneous tissue    Lipoma    Oropharynx cancer (HCC)    Tobacco abuse    hx     Past Surgical History: Past Surgical History:  Procedure Laterality Date   ABDOMINAL EXPOSURE N/A 07/23/2017   Procedure: ABDOMINAL EXPOSURE;  Surgeon: Oris Krystal FALCON, MD;  Location: [facility] OR;  Service: Vascular;  Laterality: N/A;   ANTERIOR LAT LUMBAR FUSION N/A 07/23/2017   Procedure: LUMBAR 3-4 ANTERIOR LATERAL LUMBAR FUSION 1 LEVEL;  Surgeon: Beuford Anes, MD;  Location: [facility] OR;  Service: Orthopedics;  Laterality: N/A;   ANTERIOR LUMBAR FUSION N/A 07/23/2017   Procedure: LUMBAR 4-5, LUMBAR 5-SACRUM 1, ANTERIOR LUMBAR INTERBODY FUSION WITH INSTRUMENTATION, ALLOGRAFT REQUESTED TIME 6 HRS;  Surgeon: Beuford Anes, MD;  Location: [facility] OR;  Service: Orthopedics;  Laterality: N/A;  LUMBAR 4-5, LUMBAR 5-SACRUM 1, ANTERIOR INTERBODY FUSION WITH INSTRUMENTATION AND ALLOGRAFT REQUESTED TIME 6 HRS   BACK SURGERY     COLONOSCOPY  04/12/2016   Dr. Teressa removed one polyp   COLONOSCOPY     CYSTOSCOPY  07/23/2017   Procedure: CYSTOSCOPY FLEXIBLE, ATTEMPTED CATHETER INSERTION;  Surgeon: Beuford Anes, MD;  Location: [facility] OR;  Service: Orthopedics;;   CYSTOSCOPY WITH URETHRAL DILATATION N/A 08/28/2017    Procedure: CYSTOSCOPY WITH URETHRAL DILATATION/BALLOON DILATION, Removal of suprapubic catheter;  Surgeon: Matilda Senior, MD;  Location: [facility];  Service: Urology;  Laterality: N/A;   GASTROSTOMY TUBE PLACEMENT     HERNIA REPAIR  2005   Right hernia repair    INSERTION OF SUPRAPUBIC CATHETER Right 07/23/2017   Procedure: INSERTION OF SUPRAPUBIC CATHETER;  Surgeon: Beuford Anes, MD;  Location: [facility] OR;  Service: Orthopedics;  Laterality: Right;    Allergies: Allergies[1]  Outpatient Meds: Current Facility-Administered Medications  Medication Dose Route Frequency Provider Last Rate Last Admin   lactated ringers  infusion    Continuous PRN Legrand Victory CROME III, MD 20 mL/hr at 08/05/24 0714 1,000 mL at 08/05/24 9285      ___________________________________________________________________ Objective   Exam:  BP 110/65   Temp (!) 96.9 F (36.1 C) (Temporal)   Resp 17   Ht 5' 10.5 (1.791 m)   Wt 81.6 kg   SpO2 97%   BMI 25.46 kg/m   Limited oral opening - edentulous CV: regular , S1/S2 Resp: clear to auscultation bilaterally, normal RR and effort noted GI: soft, no tenderness, with active bowel sounds.+ gastrostomy tube   Assessment: History of colon polyps   Plan: Colonoscopy   The benefits and risks of the planned procedure(s) were described in detail with the patient or (when appropriate) their health care proxy.  Risks were outlined as including, but not limited to, bleeding, infection, perforation, adverse medication reaction leading to cardiac or pulmonary decompensation, pancreatitis (if ERCP).  The limitation of incomplete mucosal  visualization was also discussed.  No guarantees or warranties were given.  The patient was provided an opportunity to ask questions and all were answered. The patient agreed with the plan.   The patient is appropriate for an endoscopic procedure in the ambulatory setting.   - Victory Brand, MD         [1]  No Known Allergies

## 2024-08-05 NOTE — Discharge Instructions (Signed)

## 2024-08-05 NOTE — Transfer of Care (Signed)
 Immediate Anesthesia Transfer of Care Note  Patient: Anthony Campos  Procedure(s) Performed: COLONOSCOPY POLYPECTOMY, INTESTINE  Patient Location: PACU and Endoscopy Unit  Anesthesia Type:MAC  Level of Consciousness: drowsy, patient cooperative, and responds to stimulation  Airway & Oxygen Therapy: Patient Spontanous Breathing  Post-op Assessment: Report given to RN and Post -op Vital signs reviewed and stable  Post vital signs: Reviewed and stable  Last Vitals:  Vitals Value Taken Time  BP 98/62 08/05/24 08:23  Temp    Pulse 73 08/05/24 08:25  Resp 18 08/05/24 08:25  SpO2 100 % 08/05/24 08:25  Vitals shown include unfiled device data.  Last Pain:  Vitals:   08/05/24 0707  TempSrc: Temporal  PainSc: 0-No pain      Patients Stated Pain Goal: 0 (08/05/24 0707)  Complications: No notable events documented.

## 2024-08-06 LAB — SURGICAL PATHOLOGY

## 2024-08-07 ENCOUNTER — Encounter (HOSPITAL_COMMUNITY): Payer: Self-pay | Admitting: Gastroenterology

## 2024-08-10 ENCOUNTER — Ambulatory Visit: Payer: Self-pay | Admitting: Gastroenterology

## 2024-08-10 NOTE — Telephone Encounter (Signed)
 Recall placed. Letter mailed.

## 2024-11-22 ENCOUNTER — Ambulatory Visit: Admitting: Adult Health

## 2025-02-21 ENCOUNTER — Encounter: Payer: Self-pay | Admitting: Adult Health
